# Patient Record
Sex: Male | Born: 1960
Health system: Southern US, Community
[De-identification: ages and names within clinical notes are randomized; demographics above are authoritative.]

## PROBLEM LIST (undated history)

## (undated) DIAGNOSIS — F419 Anxiety disorder, unspecified: Secondary | ICD-10-CM

## (undated) DIAGNOSIS — M0230 Reiter's disease, unspecified site: Secondary | ICD-10-CM

## (undated) DIAGNOSIS — K219 Gastro-esophageal reflux disease without esophagitis: Secondary | ICD-10-CM

## (undated) DIAGNOSIS — R51 Headache: Secondary | ICD-10-CM

## (undated) DIAGNOSIS — R519 Headache, unspecified: Secondary | ICD-10-CM

## (undated) DIAGNOSIS — N400 Enlarged prostate without lower urinary tract symptoms: Secondary | ICD-10-CM

## (undated) DIAGNOSIS — K589 Irritable bowel syndrome without diarrhea: Secondary | ICD-10-CM

## (undated) DIAGNOSIS — N3941 Urge incontinence: Secondary | ICD-10-CM

## (undated) DIAGNOSIS — M02369 Reiter's disease, unspecified knee: Secondary | ICD-10-CM

## (undated) DIAGNOSIS — B1920 Unspecified viral hepatitis C without hepatic coma: Secondary | ICD-10-CM

## (undated) DIAGNOSIS — M1990 Unspecified osteoarthritis, unspecified site: Secondary | ICD-10-CM

## (undated) DIAGNOSIS — M5136 Other intervertebral disc degeneration, lumbar region: Secondary | ICD-10-CM

## (undated) DIAGNOSIS — J329 Chronic sinusitis, unspecified: Secondary | ICD-10-CM

## (undated) DIAGNOSIS — N35919 Unspecified urethral stricture, male, unspecified site: Secondary | ICD-10-CM

## (undated) DIAGNOSIS — M533 Sacrococcygeal disorders, not elsewhere classified: Secondary | ICD-10-CM

## (undated) DIAGNOSIS — M5416 Radiculopathy, lumbar region: Secondary | ICD-10-CM

## (undated) DIAGNOSIS — N3281 Overactive bladder: Secondary | ICD-10-CM

## (undated) DIAGNOSIS — M51369 Other intervertebral disc degeneration, lumbar region without mention of lumbar back pain or lower extremity pain: Secondary | ICD-10-CM

## (undated) DIAGNOSIS — F429 Obsessive-compulsive disorder, unspecified: Secondary | ICD-10-CM

## (undated) DIAGNOSIS — M5126 Other intervertebral disc displacement, lumbar region: Secondary | ICD-10-CM

## (undated) DIAGNOSIS — D649 Anemia, unspecified: Secondary | ICD-10-CM

## (undated) DIAGNOSIS — L03115 Cellulitis of right lower limb: Secondary | ICD-10-CM

## (undated) HISTORY — PX: TONSILLECTOMY AND ADENOIDECTOMY: SUR1326

## (undated) HISTORY — DX: Unspecified urethral stricture, male, unspecified site: N35.919

## (undated) HISTORY — DX: Radiculopathy, lumbar region: M54.16

## (undated) HISTORY — DX: Obsessive-compulsive disorder, unspecified: F42.9

## (undated) HISTORY — DX: Reiter's disease, unspecified knee: M02.369

## (undated) HISTORY — DX: Anxiety disorder, unspecified: F41.9

## (undated) HISTORY — DX: Sacrococcygeal disorders, not elsewhere classified: M53.3

## (undated) HISTORY — PX: TRANSURETHRAL RESECTION OF PROSTATE: SHX73

## (undated) HISTORY — PX: FRACTURE SURGERY: SHX138

## (undated) HISTORY — DX: Urge incontinence: N39.41

## (undated) HISTORY — DX: Overactive bladder: N32.81

---

## 2003-01-21 ENCOUNTER — Other Ambulatory Visit (HOSPITAL_COMMUNITY): Admission: RE | Admit: 2003-01-21 | Discharge: 2003-01-29 | Payer: Self-pay | Admitting: Psychiatry

## 2007-10-28 ENCOUNTER — Emergency Department (HOSPITAL_COMMUNITY): Admission: EM | Admit: 2007-10-28 | Discharge: 2007-10-28 | Payer: Self-pay | Admitting: Emergency Medicine

## 2008-02-06 ENCOUNTER — Ambulatory Visit (HOSPITAL_BASED_OUTPATIENT_CLINIC_OR_DEPARTMENT_OTHER): Admission: RE | Admit: 2008-02-06 | Discharge: 2008-02-06 | Payer: Self-pay | Admitting: Urology

## 2008-06-13 HISTORY — PX: INGUINAL HERNIA REPAIR: SUR1180

## 2008-06-19 ENCOUNTER — Ambulatory Visit (HOSPITAL_COMMUNITY): Admission: RE | Admit: 2008-06-19 | Discharge: 2008-06-19 | Payer: Self-pay | Admitting: General Surgery

## 2008-06-19 ENCOUNTER — Encounter (INDEPENDENT_AMBULATORY_CARE_PROVIDER_SITE_OTHER): Payer: Self-pay | Admitting: General Surgery

## 2008-11-21 ENCOUNTER — Encounter: Admission: RE | Admit: 2008-11-21 | Discharge: 2008-11-21 | Payer: Self-pay | Admitting: Neurology

## 2010-04-12 ENCOUNTER — Ambulatory Visit (HOSPITAL_COMMUNITY): Admission: RE | Admit: 2010-04-12 | Discharge: 2010-04-12 | Payer: Self-pay | Admitting: Urology

## 2010-07-20 ENCOUNTER — Emergency Department (HOSPITAL_COMMUNITY): Admission: EM | Admit: 2010-07-20 | Discharge: 2010-07-20 | Payer: Self-pay | Admitting: Emergency Medicine

## 2011-03-28 NOTE — Op Note (Signed)
Ryan [name], Ryan [name]                ACCOUNT NO.:  [id number]   MEDICAL RECORD NO.:  [id number]          PATIENT TYPE:  AMB   LOCATION:  DAY                          FACILITY:  [facility]   PHYSICIAN:  Juanetta Gosling, MDDATE OF BIRTH:  04-01-1961   DATE OF PROCEDURE:  06/19/2008  DATE OF DISCHARGE:                               OPERATIVE REPORT   PREOPERATIVE DIAGNOSIS:  Left inguinal hernia.   POSTOPERATIVE DIAGNOSIS:  Indirect left inguinal hernia with cord  lipoma.   OPERATION PERFORMED:  Left inguinal hernia repair with mesh patch Ultra  Pro placed.   SURGEON:  Juanetta Gosling, MD   ASSISTANT:  Angelia Mould. Derrell Lolling, M.D.   ANESTHESIA:  General.   FINDINGS:  Indirect inguinal hernia with cord lipoma.   SPECIMENS:  Cord lipoma to pathology.   ESTIMATED BLOOD LOSS:  Minimal.   COMPLICATIONS:  None.   DRAINS:  None.   DISPOSITION:  To PACU in stable condition.   INDICATIONS FOR PROCEDURE:  The patient is a 50 year old male being seen  by Dr. Annabell Howells, his urologist recently.  Noticed some swelling and  discomfort in his left groin.  He noticed a bulge there for several  months associated with pain at times.  On his exam he had a left  inguinal hernia and we planned for left inguinal hernia repair with  mesh.   DESCRIPTION OF PROCEDURE:  After informed consent was obtained, the  patient was taken to the operating room.  He was administered 1 g of  intravenous Ancef without complication.  His left groin was then prepped  and draped in standard sterile surgical fashion.  Approximately a 4 cm  left groin incision was then made.  Dissection was then carried out down  to the external abdominal  oblique.  This was then entered sharply  through the external ring. The cord was then encircled with a Penrose  drain.  The ilioinguinal nerve was protected during this portion of the  procedure.  There was no direct defect noted.  There was a cord lipoma  noted that was freed from  the surrounding cord structures taking care to  preserve the vas deferens.  There was also an indirect hernia that was  present as well.  I tacked the internal oblique down to the shelving  edge to close the internal ring somewhat and then fashioned a piece  patch of Ultra Pro mesh that I sutured into place on the pubic tubercle.  Once the shelving edge, cut the mesh, wrapped it around the spermatic  cord and then tacked the mesh back together onto the end of the shelving  edge.  I also tacked this superiorly to the internal oblique and then  laid the lateral portion underneath the external oblique.  The mesh lay  flat at the end of the procedure and covered all necessary areas.  Hemostasis was observed.  A 3-0 Vicryl was then used to close the  external oblique.  A 4-0 Monocryl was used to close the skin.  Dermabond  was then placed over the skin.  The testicle was  in the scrotum at the  completion of the operation.  I infiltrated 10 mL of 0.25% Marcaine  without epinephrine into the wound as well as performed an ilioinguinal  block at the end of the procedure.  He tolerated this well, was  extubated in the operating room and transferred to PACU in stable  condition.      Juanetta Gosling, MD  Electronically Signed     MCW/MEDQ  D:  06/19/2008  T:  06/19/2008  Job:  [id number]

## 2011-03-28 NOTE — Op Note (Signed)
[name], [name]                ACCOUNT NO.:  [id number]   MEDICAL RECORD NO.:  [id number]          PATIENT TYPE:  AMB   LOCATION:  [facility]                         FACILITY:  [facility]   PHYSICIAN:  Excell Seltzer. Annabell Howells, M.D.    DATE OF BIRTH:  1961-08-12   DATE OF PROCEDURE:  02/06/2008  DATE OF DISCHARGE:                               OPERATIVE REPORT   PROCEDURE:  Cystoscopy, urethral balloon dilation, hydrodistention of  the bladder, and installation of Pyridium and Marcaine.   PREOPERATIVE DIAGNOSIS:  Urethral stricture disease, rule out  interstitial cystitis.   POSTOPERATIVE DIAGNOSIS:  Urethral stricture disease.   SURGEON:  Dr. Bjorn Pippin.   ANESTHESIA:  General.   SPECIMEN:  None.   COMPLICATIONS:  None.   INDICATIONS:  [name] is 50 year old white male with history of stricture  disease with a mild recurrence and significant irritative voiding  symptoms concerning for interstitial cystitis.   FINDINGS OF PROCEDURE:  The patient was given Cipro.  He was taken to  the operating room where general anesthetic was induced.  He was placed  in lithotomy position.  His perineum and genitalia were prepped with  Betadine solution.  He was draped in the usual sterile fashion.  Cystoscopy was performed using a 22-French scope and 12- and 70-degree  lenses.  Examination revealed a normal anterior urethra in the bulb.  There was recurrent stricture that with only mild resistance excepted a  22-French scope.  The external sphincter was intact.  The prostatic  urethra was short with no apparent hyperplasia, but the bladder neck was  somewhat tight and muscular.  Examination of bladder revealed a smooth  bladder wall without tumor, stones, or inflammation.  Ureteral orifices  were in their normal anatomic position effluxing clear urine.   After initial cystoscopy, a guidewire was placed through the scope into  the bladder and a 15-cm 24-French high-pressure balloon was then placed  across  the stricture and inflated to 14 atmospheres.  This was held for  2 minutes and then deflated.  The cystoscope was then reinserted.  Examination revealed disruption of the bulbar stricture with a widely  patent urethra.   The cystoscope was then placed in the bladder, and the bladder was  filled under 80 cm of water pressure capacity and held for 2 minutes.  The bladder was then drained.  His capacity under anesthesia was 940 mL.  There were very minimal glomerulations, nothing truly pathognomonic of  interstitial cystitis.   A 16-French red rubber catheter was inserted, and the bladder was  instilled with 30 mL of quarter percent Marcaine with 400 mg of crushed  Pyridium.  A B&O suppository was placed.  He was taken down from  lithotomy position.  His anesthetic was reversed.  He was moved to the  recovery room in stable condition.  There were no complications.      Excell Seltzer. Annabell Howells, M.D.  Electronically Signed     JJW/MEDQ  D:  02/06/2008  T:  02/07/2008  Job:  [id number]

## 2011-06-13 ENCOUNTER — Ambulatory Visit (HOSPITAL_BASED_OUTPATIENT_CLINIC_OR_DEPARTMENT_OTHER): Admission: RE | Admit: 2011-06-13 | Payer: Medicare Other | Source: Ambulatory Visit | Admitting: Urology

## 2011-06-13 ENCOUNTER — Ambulatory Visit (HOSPITAL_BASED_OUTPATIENT_CLINIC_OR_DEPARTMENT_OTHER)
Admission: RE | Admit: 2011-06-13 | Discharge: 2011-06-13 | Disposition: A | Discharge status: Home / Self Care | Payer: Medicare Other | Source: Ambulatory Visit | Attending: Urology | Admitting: Urology

## 2011-06-13 DIAGNOSIS — N138 Other obstructive and reflux uropathy: Secondary | ICD-10-CM | POA: Insufficient documentation

## 2011-06-13 DIAGNOSIS — Z79899 Other long term (current) drug therapy: Secondary | ICD-10-CM | POA: Insufficient documentation

## 2011-06-13 DIAGNOSIS — N401 Enlarged prostate with lower urinary tract symptoms: Secondary | ICD-10-CM | POA: Insufficient documentation

## 2011-06-13 DIAGNOSIS — Z01812 Encounter for preprocedural laboratory examination: Secondary | ICD-10-CM | POA: Insufficient documentation

## 2011-06-13 DIAGNOSIS — K219 Gastro-esophageal reflux disease without esophagitis: Secondary | ICD-10-CM | POA: Insufficient documentation

## 2011-06-13 DIAGNOSIS — N35919 Unspecified urethral stricture, male, unspecified site: Secondary | ICD-10-CM | POA: Insufficient documentation

## 2011-06-21 NOTE — Op Note (Signed)
  NAMEEVERET, FLAGG                ACCOUNT NO.:  [id number]  MEDICAL RECORD NO.:  [id number]  LOCATION:                                 FACILITY:  PHYSICIAN:  Excell Seltzer. Annabell Howells, M.D.    DATE OF BIRTH:  1961/01/09  DATE OF PROCEDURE:  06/13/2011 DATE OF DISCHARGE:                              OPERATIVE REPORT   PROCEDURE PERFORMED:  Cystoscopy, urethral dilation.  PREOPERATIVE DIAGNOSIS:  Urethral stricture.  POSTOPERATIVE DIAGNOSIS:  Urethral stricture.  SURGEON:  Excell Seltzer. Annabell Howells, MD  ANESTHESIA:  General.  SPECIMEN:  None.  DRAINS:  None.  COMPLICATIONS:  None.  INDICATIONS:  Cowen is a 50 year old white male with a long complicated urologic history.  He has bladder outlet obstruction and a history of urethral stricture disease.  He is interested in transurethral microwave thermotherapy of the prostate, but on recent office cysto, he had a stricture that was felt to be too narrow to easily admit the 22-French microwave catheter.  After discussing the options, he has elected to undergo management of stricture first with microwave therapy later, despite my efforts to try to convince him to undergo a bladder neck incision, which I think would be most effective for him.  DESCRIPTION OF PROCEDURE:  He was taken to operating room after receiving Cipro.  A general anesthetic was induced.  He was placed in lithotomy position.  His perineum and genitalia were prepped with Betadine solution.  He was draped in usual sterile fashion.  Cystoscopy was performed using a 22-French scope with a 12-degree lens. Examination revealed normal anterior urethra.  In the bulb, there was a stricture; however, it appeared less severe than it had on office cystoscopy and the 22-French scope passed easily through the stricture into the proximal urethra.  The external sphincter was intact. Prostatic urethra was approximately 3 cm in length with high bladder neck with obstruction.  Examination of  bladder revealed mild trabeculation.  No tumors or stones were identified.  The ureteral orifices were unremarkable.  After completion of cystoscopy, the urethra was dilated to 28-French with Sissy Hoff sounds.  I did not feel the laser incision was indicated. Once the stricture had been dilated, the bladder was drained.  The patient was taken down from lithotomy position.  His anesthetic was reversed.  He was admitted to the recovery room in stable condition. There were no complications.  He will be scheduled for the microwave treatment sometime in the near future.     Excell Seltzer. Annabell Howells, M.D.     JJW/MEDQ  D:  06/13/2011  T:  06/13/2011  Job:  [id number]  Electronically Signed by Bjorn Pippin M.D. on 06/21/2011 10:29:10 AM

## 2011-08-11 LAB — URINALYSIS, ROUTINE W REFLEX MICROSCOPIC
Bilirubin Urine: NEGATIVE
Glucose, UA: NEGATIVE
Hgb urine dipstick: NEGATIVE
Ketones, ur: NEGATIVE
Nitrite: NEGATIVE
Protein, ur: NEGATIVE
Specific Gravity, Urine: 1.025
Urobilinogen, UA: 0.2
pH: 7

## 2011-08-11 LAB — URINE MICROSCOPIC-ADD ON

## 2011-08-11 LAB — CBC
HCT: 42.8
Hemoglobin: 14.9
MCHC: 34.8
MCV: 97.1
Platelets: 201
RBC: 4.4
RDW: 11.9
WBC: 5

## 2011-08-11 LAB — BASIC METABOLIC PANEL WITH GFR
BUN: 12
CO2: 33 — ABNORMAL HIGH
Calcium: 9.4
Chloride: 101
Creatinine, Ser: 1
GFR calc non Af Amer: >60
Glucose, Bld: 78
Potassium: 4.5
Sodium: 140

## 2011-08-11 LAB — URINE CULTURE
Colony Count: NO GROWTH
Culture: NO GROWTH
Special Requests: NEGATIVE

## 2011-08-11 LAB — DIFFERENTIAL
Eosinophils Absolute: 0.2
Eosinophils Relative: 3
Lymphocytes Relative: 37
Neutro Abs: 2.5

## 2011-08-18 LAB — URINE CULTURE: Culture: NO GROWTH

## 2011-11-24 DIAGNOSIS — F429 Obsessive-compulsive disorder, unspecified: Secondary | ICD-10-CM | POA: Diagnosis not present

## 2011-12-11 DIAGNOSIS — J018 Other acute sinusitis: Secondary | ICD-10-CM | POA: Diagnosis not present

## 2011-12-15 DIAGNOSIS — S6000XA Contusion of unspecified finger without damage to nail, initial encounter: Secondary | ICD-10-CM | POA: Diagnosis not present

## 2011-12-15 DIAGNOSIS — W230XXA Caught, crushed, jammed, or pinched between moving objects, initial encounter: Secondary | ICD-10-CM | POA: Diagnosis not present

## 2011-12-15 DIAGNOSIS — F429 Obsessive-compulsive disorder, unspecified: Secondary | ICD-10-CM | POA: Diagnosis not present

## 2011-12-15 DIAGNOSIS — M25549 Pain in joints of unspecified hand: Secondary | ICD-10-CM | POA: Diagnosis not present

## 2012-01-25 DIAGNOSIS — F429 Obsessive-compulsive disorder, unspecified: Secondary | ICD-10-CM | POA: Diagnosis not present

## 2012-02-02 DIAGNOSIS — M25569 Pain in unspecified knee: Secondary | ICD-10-CM | POA: Diagnosis not present

## 2012-02-02 DIAGNOSIS — R21 Rash and other nonspecific skin eruption: Secondary | ICD-10-CM | POA: Diagnosis not present

## 2012-02-02 DIAGNOSIS — S6000XA Contusion of unspecified finger without damage to nail, initial encounter: Secondary | ICD-10-CM | POA: Diagnosis not present

## 2012-02-08 DIAGNOSIS — N3941 Urge incontinence: Secondary | ICD-10-CM | POA: Diagnosis not present

## 2012-02-08 DIAGNOSIS — N318 Other neuromuscular dysfunction of bladder: Secondary | ICD-10-CM | POA: Diagnosis not present

## 2012-02-08 DIAGNOSIS — N138 Other obstructive and reflux uropathy: Secondary | ICD-10-CM | POA: Diagnosis not present

## 2012-02-08 DIAGNOSIS — N401 Enlarged prostate with lower urinary tract symptoms: Secondary | ICD-10-CM | POA: Diagnosis not present

## 2012-02-12 DIAGNOSIS — L259 Unspecified contact dermatitis, unspecified cause: Secondary | ICD-10-CM | POA: Diagnosis not present

## 2012-02-12 DIAGNOSIS — M2550 Pain in unspecified joint: Secondary | ICD-10-CM | POA: Diagnosis not present

## 2012-02-12 DIAGNOSIS — L820 Inflamed seborrheic keratosis: Secondary | ICD-10-CM | POA: Diagnosis not present

## 2012-02-12 DIAGNOSIS — S62639A Displaced fracture of distal phalanx of unspecified finger, initial encounter for closed fracture: Secondary | ICD-10-CM | POA: Diagnosis not present

## 2012-02-12 DIAGNOSIS — S6000XA Contusion of unspecified finger without damage to nail, initial encounter: Secondary | ICD-10-CM | POA: Diagnosis not present

## 2012-02-15 DIAGNOSIS — F429 Obsessive-compulsive disorder, unspecified: Secondary | ICD-10-CM | POA: Diagnosis not present

## 2012-02-23 DIAGNOSIS — S62639A Displaced fracture of distal phalanx of unspecified finger, initial encounter for closed fracture: Secondary | ICD-10-CM | POA: Diagnosis not present

## 2012-02-29 DIAGNOSIS — S8000XA Contusion of unspecified knee, initial encounter: Secondary | ICD-10-CM | POA: Diagnosis not present

## 2012-02-29 DIAGNOSIS — IMO0002 Reserved for concepts with insufficient information to code with codable children: Secondary | ICD-10-CM | POA: Diagnosis not present

## 2012-03-08 DIAGNOSIS — F429 Obsessive-compulsive disorder, unspecified: Secondary | ICD-10-CM | POA: Diagnosis not present

## 2012-03-11 DIAGNOSIS — N411 Chronic prostatitis: Secondary | ICD-10-CM | POA: Diagnosis not present

## 2012-03-11 DIAGNOSIS — N3941 Urge incontinence: Secondary | ICD-10-CM | POA: Diagnosis not present

## 2012-03-11 DIAGNOSIS — N138 Other obstructive and reflux uropathy: Secondary | ICD-10-CM | POA: Diagnosis not present

## 2012-03-11 DIAGNOSIS — N401 Enlarged prostate with lower urinary tract symptoms: Secondary | ICD-10-CM | POA: Diagnosis not present

## 2012-03-11 DIAGNOSIS — N318 Other neuromuscular dysfunction of bladder: Secondary | ICD-10-CM | POA: Diagnosis not present

## 2012-03-22 DIAGNOSIS — S62639A Displaced fracture of distal phalanx of unspecified finger, initial encounter for closed fracture: Secondary | ICD-10-CM | POA: Diagnosis not present

## 2012-04-05 DIAGNOSIS — F429 Obsessive-compulsive disorder, unspecified: Secondary | ICD-10-CM | POA: Diagnosis not present

## 2012-05-13 DIAGNOSIS — N318 Other neuromuscular dysfunction of bladder: Secondary | ICD-10-CM | POA: Diagnosis not present

## 2012-05-13 DIAGNOSIS — N3941 Urge incontinence: Secondary | ICD-10-CM | POA: Diagnosis not present

## 2012-05-13 DIAGNOSIS — N138 Other obstructive and reflux uropathy: Secondary | ICD-10-CM | POA: Diagnosis not present

## 2012-05-13 DIAGNOSIS — N401 Enlarged prostate with lower urinary tract symptoms: Secondary | ICD-10-CM | POA: Diagnosis not present

## 2012-05-30 DIAGNOSIS — H04129 Dry eye syndrome of unspecified lacrimal gland: Secondary | ICD-10-CM | POA: Diagnosis not present

## 2012-05-31 DIAGNOSIS — N411 Chronic prostatitis: Secondary | ICD-10-CM | POA: Diagnosis not present

## 2012-05-31 DIAGNOSIS — R350 Frequency of micturition: Secondary | ICD-10-CM | POA: Diagnosis not present

## 2012-05-31 DIAGNOSIS — R634 Abnormal weight loss: Secondary | ICD-10-CM | POA: Diagnosis not present

## 2012-06-06 DIAGNOSIS — F429 Obsessive-compulsive disorder, unspecified: Secondary | ICD-10-CM | POA: Diagnosis not present

## 2012-06-13 DIAGNOSIS — N3281 Overactive bladder: Secondary | ICD-10-CM | POA: Insufficient documentation

## 2012-06-27 DIAGNOSIS — M25529 Pain in unspecified elbow: Secondary | ICD-10-CM | POA: Diagnosis not present

## 2012-06-27 DIAGNOSIS — M7710 Lateral epicondylitis, unspecified elbow: Secondary | ICD-10-CM | POA: Diagnosis not present

## 2012-07-01 DIAGNOSIS — M7710 Lateral epicondylitis, unspecified elbow: Secondary | ICD-10-CM | POA: Diagnosis not present

## 2012-07-01 DIAGNOSIS — M25529 Pain in unspecified elbow: Secondary | ICD-10-CM | POA: Diagnosis not present

## 2012-07-01 DIAGNOSIS — M6281 Muscle weakness (generalized): Secondary | ICD-10-CM | POA: Diagnosis not present

## 2012-07-04 DIAGNOSIS — N138 Other obstructive and reflux uropathy: Secondary | ICD-10-CM | POA: Diagnosis not present

## 2012-07-04 DIAGNOSIS — N3289 Other specified disorders of bladder: Secondary | ICD-10-CM | POA: Diagnosis not present

## 2012-07-04 DIAGNOSIS — N401 Enlarged prostate with lower urinary tract symptoms: Secondary | ICD-10-CM | POA: Diagnosis not present

## 2012-07-05 DIAGNOSIS — M25529 Pain in unspecified elbow: Secondary | ICD-10-CM | POA: Diagnosis not present

## 2012-07-05 DIAGNOSIS — M6281 Muscle weakness (generalized): Secondary | ICD-10-CM | POA: Diagnosis not present

## 2012-07-05 DIAGNOSIS — M7710 Lateral epicondylitis, unspecified elbow: Secondary | ICD-10-CM | POA: Diagnosis not present

## 2012-07-11 DIAGNOSIS — F429 Obsessive-compulsive disorder, unspecified: Secondary | ICD-10-CM | POA: Diagnosis not present

## 2012-07-12 DIAGNOSIS — M6281 Muscle weakness (generalized): Secondary | ICD-10-CM | POA: Diagnosis not present

## 2012-07-12 DIAGNOSIS — M25529 Pain in unspecified elbow: Secondary | ICD-10-CM | POA: Diagnosis not present

## 2012-07-12 DIAGNOSIS — M7710 Lateral epicondylitis, unspecified elbow: Secondary | ICD-10-CM | POA: Diagnosis not present

## 2012-07-17 DIAGNOSIS — M7710 Lateral epicondylitis, unspecified elbow: Secondary | ICD-10-CM | POA: Diagnosis not present

## 2012-07-17 DIAGNOSIS — M25529 Pain in unspecified elbow: Secondary | ICD-10-CM | POA: Diagnosis not present

## 2012-07-17 DIAGNOSIS — M6281 Muscle weakness (generalized): Secondary | ICD-10-CM | POA: Diagnosis not present

## 2012-07-23 DIAGNOSIS — M7710 Lateral epicondylitis, unspecified elbow: Secondary | ICD-10-CM | POA: Diagnosis not present

## 2012-07-23 DIAGNOSIS — M6281 Muscle weakness (generalized): Secondary | ICD-10-CM | POA: Diagnosis not present

## 2012-07-23 DIAGNOSIS — M25529 Pain in unspecified elbow: Secondary | ICD-10-CM | POA: Diagnosis not present

## 2012-07-25 DIAGNOSIS — N138 Other obstructive and reflux uropathy: Secondary | ICD-10-CM | POA: Diagnosis not present

## 2012-07-25 DIAGNOSIS — N401 Enlarged prostate with lower urinary tract symptoms: Secondary | ICD-10-CM | POA: Diagnosis not present

## 2012-07-25 DIAGNOSIS — N3289 Other specified disorders of bladder: Secondary | ICD-10-CM | POA: Diagnosis not present

## 2012-07-26 DIAGNOSIS — M25529 Pain in unspecified elbow: Secondary | ICD-10-CM | POA: Diagnosis not present

## 2012-07-26 DIAGNOSIS — M6281 Muscle weakness (generalized): Secondary | ICD-10-CM | POA: Diagnosis not present

## 2012-07-26 DIAGNOSIS — M7710 Lateral epicondylitis, unspecified elbow: Secondary | ICD-10-CM | POA: Diagnosis not present

## 2012-07-30 DIAGNOSIS — M6281 Muscle weakness (generalized): Secondary | ICD-10-CM | POA: Diagnosis not present

## 2012-07-30 DIAGNOSIS — M25529 Pain in unspecified elbow: Secondary | ICD-10-CM | POA: Diagnosis not present

## 2012-07-30 DIAGNOSIS — M7710 Lateral epicondylitis, unspecified elbow: Secondary | ICD-10-CM | POA: Diagnosis not present

## 2012-08-01 DIAGNOSIS — M7710 Lateral epicondylitis, unspecified elbow: Secondary | ICD-10-CM | POA: Diagnosis not present

## 2012-08-01 DIAGNOSIS — M6281 Muscle weakness (generalized): Secondary | ICD-10-CM | POA: Diagnosis not present

## 2012-08-01 DIAGNOSIS — H04129 Dry eye syndrome of unspecified lacrimal gland: Secondary | ICD-10-CM | POA: Diagnosis not present

## 2012-08-01 DIAGNOSIS — M25529 Pain in unspecified elbow: Secondary | ICD-10-CM | POA: Diagnosis not present

## 2012-08-05 DIAGNOSIS — M25529 Pain in unspecified elbow: Secondary | ICD-10-CM | POA: Diagnosis not present

## 2012-08-05 DIAGNOSIS — M6281 Muscle weakness (generalized): Secondary | ICD-10-CM | POA: Diagnosis not present

## 2012-08-05 DIAGNOSIS — M7710 Lateral epicondylitis, unspecified elbow: Secondary | ICD-10-CM | POA: Diagnosis not present

## 2012-08-08 DIAGNOSIS — M7710 Lateral epicondylitis, unspecified elbow: Secondary | ICD-10-CM | POA: Diagnosis not present

## 2012-08-08 DIAGNOSIS — M25529 Pain in unspecified elbow: Secondary | ICD-10-CM | POA: Diagnosis not present

## 2012-08-09 DIAGNOSIS — M7710 Lateral epicondylitis, unspecified elbow: Secondary | ICD-10-CM | POA: Diagnosis not present

## 2012-08-09 DIAGNOSIS — M25529 Pain in unspecified elbow: Secondary | ICD-10-CM | POA: Diagnosis not present

## 2012-08-09 DIAGNOSIS — M6281 Muscle weakness (generalized): Secondary | ICD-10-CM | POA: Diagnosis not present

## 2012-08-13 DIAGNOSIS — M7710 Lateral epicondylitis, unspecified elbow: Secondary | ICD-10-CM | POA: Diagnosis not present

## 2012-08-13 DIAGNOSIS — M25529 Pain in unspecified elbow: Secondary | ICD-10-CM | POA: Diagnosis not present

## 2012-08-13 DIAGNOSIS — M6281 Muscle weakness (generalized): Secondary | ICD-10-CM | POA: Diagnosis not present

## 2012-08-22 DIAGNOSIS — N401 Enlarged prostate with lower urinary tract symptoms: Secondary | ICD-10-CM | POA: Diagnosis not present

## 2012-08-22 DIAGNOSIS — N3289 Other specified disorders of bladder: Secondary | ICD-10-CM | POA: Diagnosis not present

## 2012-08-22 DIAGNOSIS — N138 Other obstructive and reflux uropathy: Secondary | ICD-10-CM | POA: Diagnosis not present

## 2012-09-04 DIAGNOSIS — R350 Frequency of micturition: Secondary | ICD-10-CM | POA: Diagnosis not present

## 2012-09-04 DIAGNOSIS — Z23 Encounter for immunization: Secondary | ICD-10-CM | POA: Diagnosis not present

## 2012-09-04 DIAGNOSIS — N411 Chronic prostatitis: Secondary | ICD-10-CM | POA: Diagnosis not present

## 2012-09-04 DIAGNOSIS — F339 Major depressive disorder, recurrent, unspecified: Secondary | ICD-10-CM | POA: Diagnosis not present

## 2012-09-23 DIAGNOSIS — N138 Other obstructive and reflux uropathy: Secondary | ICD-10-CM | POA: Diagnosis not present

## 2012-09-23 DIAGNOSIS — N3289 Other specified disorders of bladder: Secondary | ICD-10-CM | POA: Diagnosis not present

## 2012-09-23 DIAGNOSIS — N401 Enlarged prostate with lower urinary tract symptoms: Secondary | ICD-10-CM | POA: Diagnosis not present

## 2012-10-03 DIAGNOSIS — F429 Obsessive-compulsive disorder, unspecified: Secondary | ICD-10-CM | POA: Diagnosis not present

## 2012-10-18 DIAGNOSIS — M7710 Lateral epicondylitis, unspecified elbow: Secondary | ICD-10-CM | POA: Diagnosis not present

## 2012-10-30 DIAGNOSIS — N138 Other obstructive and reflux uropathy: Secondary | ICD-10-CM | POA: Diagnosis not present

## 2012-10-30 DIAGNOSIS — N401 Enlarged prostate with lower urinary tract symptoms: Secondary | ICD-10-CM | POA: Diagnosis not present

## 2012-10-30 DIAGNOSIS — N318 Other neuromuscular dysfunction of bladder: Secondary | ICD-10-CM | POA: Diagnosis not present

## 2012-10-31 DIAGNOSIS — H04129 Dry eye syndrome of unspecified lacrimal gland: Secondary | ICD-10-CM | POA: Diagnosis not present

## 2012-10-31 DIAGNOSIS — H524 Presbyopia: Secondary | ICD-10-CM | POA: Diagnosis not present

## 2012-11-27 DIAGNOSIS — N401 Enlarged prostate with lower urinary tract symptoms: Secondary | ICD-10-CM | POA: Diagnosis not present

## 2012-11-27 DIAGNOSIS — N318 Other neuromuscular dysfunction of bladder: Secondary | ICD-10-CM | POA: Diagnosis not present

## 2012-11-27 DIAGNOSIS — N138 Other obstructive and reflux uropathy: Secondary | ICD-10-CM | POA: Diagnosis not present

## 2012-12-13 DIAGNOSIS — J00 Acute nasopharyngitis [common cold]: Secondary | ICD-10-CM | POA: Diagnosis not present

## 2012-12-13 DIAGNOSIS — J019 Acute sinusitis, unspecified: Secondary | ICD-10-CM | POA: Diagnosis not present

## 2012-12-18 DIAGNOSIS — N401 Enlarged prostate with lower urinary tract symptoms: Secondary | ICD-10-CM | POA: Diagnosis not present

## 2012-12-18 DIAGNOSIS — N138 Other obstructive and reflux uropathy: Secondary | ICD-10-CM | POA: Diagnosis not present

## 2012-12-18 DIAGNOSIS — N318 Other neuromuscular dysfunction of bladder: Secondary | ICD-10-CM | POA: Diagnosis not present

## 2012-12-18 DIAGNOSIS — N3289 Other specified disorders of bladder: Secondary | ICD-10-CM | POA: Diagnosis not present

## 2013-01-02 DIAGNOSIS — H04129 Dry eye syndrome of unspecified lacrimal gland: Secondary | ICD-10-CM | POA: Diagnosis not present

## 2013-01-15 DIAGNOSIS — N138 Other obstructive and reflux uropathy: Secondary | ICD-10-CM | POA: Diagnosis not present

## 2013-01-15 DIAGNOSIS — N318 Other neuromuscular dysfunction of bladder: Secondary | ICD-10-CM | POA: Diagnosis not present

## 2013-01-15 DIAGNOSIS — N401 Enlarged prostate with lower urinary tract symptoms: Secondary | ICD-10-CM | POA: Diagnosis not present

## 2013-01-27 DIAGNOSIS — J309 Allergic rhinitis, unspecified: Secondary | ICD-10-CM | POA: Diagnosis not present

## 2013-01-27 DIAGNOSIS — J018 Other acute sinusitis: Secondary | ICD-10-CM | POA: Diagnosis not present

## 2013-02-04 DIAGNOSIS — F429 Obsessive-compulsive disorder, unspecified: Secondary | ICD-10-CM | POA: Diagnosis not present

## 2013-02-05 DIAGNOSIS — N401 Enlarged prostate with lower urinary tract symptoms: Secondary | ICD-10-CM | POA: Diagnosis not present

## 2013-02-05 DIAGNOSIS — N138 Other obstructive and reflux uropathy: Secondary | ICD-10-CM | POA: Diagnosis not present

## 2013-02-05 DIAGNOSIS — R109 Unspecified abdominal pain: Secondary | ICD-10-CM | POA: Diagnosis not present

## 2013-02-05 DIAGNOSIS — N318 Other neuromuscular dysfunction of bladder: Secondary | ICD-10-CM | POA: Diagnosis not present

## 2013-02-19 DIAGNOSIS — M0230 Reiter's disease, unspecified site: Secondary | ICD-10-CM | POA: Diagnosis not present

## 2013-02-20 DIAGNOSIS — Z79899 Other long term (current) drug therapy: Secondary | ICD-10-CM | POA: Diagnosis not present

## 2013-02-20 DIAGNOSIS — Z0000 Encounter for general adult medical examination without abnormal findings: Secondary | ICD-10-CM | POA: Diagnosis not present

## 2013-02-20 DIAGNOSIS — J309 Allergic rhinitis, unspecified: Secondary | ICD-10-CM | POA: Diagnosis not present

## 2013-02-20 DIAGNOSIS — J019 Acute sinusitis, unspecified: Secondary | ICD-10-CM | POA: Diagnosis not present

## 2013-02-27 DIAGNOSIS — N318 Other neuromuscular dysfunction of bladder: Secondary | ICD-10-CM | POA: Diagnosis not present

## 2013-02-27 DIAGNOSIS — N138 Other obstructive and reflux uropathy: Secondary | ICD-10-CM | POA: Diagnosis not present

## 2013-02-27 DIAGNOSIS — N401 Enlarged prostate with lower urinary tract symptoms: Secondary | ICD-10-CM | POA: Diagnosis not present

## 2013-03-10 DIAGNOSIS — F429 Obsessive-compulsive disorder, unspecified: Secondary | ICD-10-CM | POA: Diagnosis not present

## 2013-04-04 DIAGNOSIS — F429 Obsessive-compulsive disorder, unspecified: Secondary | ICD-10-CM | POA: Diagnosis not present

## 2013-04-09 DIAGNOSIS — J018 Other acute sinusitis: Secondary | ICD-10-CM | POA: Diagnosis not present

## 2013-04-10 DIAGNOSIS — N318 Other neuromuscular dysfunction of bladder: Secondary | ICD-10-CM | POA: Diagnosis not present

## 2013-04-10 DIAGNOSIS — N138 Other obstructive and reflux uropathy: Secondary | ICD-10-CM | POA: Diagnosis not present

## 2013-04-10 DIAGNOSIS — N401 Enlarged prostate with lower urinary tract symptoms: Secondary | ICD-10-CM | POA: Diagnosis not present

## 2013-05-06 DIAGNOSIS — J309 Allergic rhinitis, unspecified: Secondary | ICD-10-CM | POA: Diagnosis not present

## 2013-05-06 DIAGNOSIS — I781 Nevus, non-neoplastic: Secondary | ICD-10-CM | POA: Diagnosis not present

## 2013-05-12 DIAGNOSIS — F429 Obsessive-compulsive disorder, unspecified: Secondary | ICD-10-CM | POA: Diagnosis not present

## 2013-05-28 DIAGNOSIS — L821 Other seborrheic keratosis: Secondary | ICD-10-CM | POA: Diagnosis not present

## 2013-05-28 DIAGNOSIS — L570 Actinic keratosis: Secondary | ICD-10-CM | POA: Diagnosis not present

## 2013-05-28 DIAGNOSIS — B351 Tinea unguium: Secondary | ICD-10-CM | POA: Diagnosis not present

## 2013-05-28 DIAGNOSIS — B353 Tinea pedis: Secondary | ICD-10-CM | POA: Diagnosis not present

## 2013-05-29 DIAGNOSIS — J3489 Other specified disorders of nose and nasal sinuses: Secondary | ICD-10-CM | POA: Diagnosis not present

## 2013-05-29 DIAGNOSIS — H60399 Other infective otitis externa, unspecified ear: Secondary | ICD-10-CM | POA: Diagnosis not present

## 2013-05-29 DIAGNOSIS — J329 Chronic sinusitis, unspecified: Secondary | ICD-10-CM | POA: Diagnosis not present

## 2013-05-29 DIAGNOSIS — J309 Allergic rhinitis, unspecified: Secondary | ICD-10-CM | POA: Diagnosis not present

## 2013-06-01 DIAGNOSIS — R209 Unspecified disturbances of skin sensation: Secondary | ICD-10-CM | POA: Diagnosis not present

## 2013-06-01 DIAGNOSIS — R5381 Other malaise: Secondary | ICD-10-CM | POA: Diagnosis not present

## 2013-06-01 DIAGNOSIS — R0789 Other chest pain: Secondary | ICD-10-CM | POA: Diagnosis not present

## 2013-06-01 DIAGNOSIS — M5412 Radiculopathy, cervical region: Secondary | ICD-10-CM | POA: Diagnosis not present

## 2013-06-06 DIAGNOSIS — N390 Urinary tract infection, site not specified: Secondary | ICD-10-CM | POA: Diagnosis not present

## 2013-06-06 DIAGNOSIS — B351 Tinea unguium: Secondary | ICD-10-CM | POA: Diagnosis not present

## 2013-06-06 DIAGNOSIS — J329 Chronic sinusitis, unspecified: Secondary | ICD-10-CM | POA: Diagnosis not present

## 2013-06-09 DIAGNOSIS — J3489 Other specified disorders of nose and nasal sinuses: Secondary | ICD-10-CM | POA: Diagnosis not present

## 2013-06-09 DIAGNOSIS — J343 Hypertrophy of nasal turbinates: Secondary | ICD-10-CM | POA: Diagnosis not present

## 2013-06-09 DIAGNOSIS — J342 Deviated nasal septum: Secondary | ICD-10-CM | POA: Diagnosis not present

## 2013-06-20 DIAGNOSIS — F429 Obsessive-compulsive disorder, unspecified: Secondary | ICD-10-CM | POA: Diagnosis not present

## 2013-06-23 DIAGNOSIS — N400 Enlarged prostate without lower urinary tract symptoms: Secondary | ICD-10-CM | POA: Diagnosis not present

## 2013-06-23 DIAGNOSIS — R350 Frequency of micturition: Secondary | ICD-10-CM | POA: Diagnosis not present

## 2013-07-08 DIAGNOSIS — J342 Deviated nasal septum: Secondary | ICD-10-CM | POA: Diagnosis not present

## 2013-07-08 DIAGNOSIS — J3489 Other specified disorders of nose and nasal sinuses: Secondary | ICD-10-CM | POA: Diagnosis not present

## 2013-07-08 DIAGNOSIS — J343 Hypertrophy of nasal turbinates: Secondary | ICD-10-CM | POA: Diagnosis not present

## 2013-07-18 DIAGNOSIS — M79609 Pain in unspecified limb: Secondary | ICD-10-CM | POA: Diagnosis not present

## 2013-07-18 DIAGNOSIS — M7989 Other specified soft tissue disorders: Secondary | ICD-10-CM | POA: Diagnosis not present

## 2013-07-18 DIAGNOSIS — I83893 Varicose veins of bilateral lower extremities with other complications: Secondary | ICD-10-CM | POA: Diagnosis not present

## 2013-07-22 DIAGNOSIS — M79609 Pain in unspecified limb: Secondary | ICD-10-CM | POA: Diagnosis not present

## 2013-07-22 DIAGNOSIS — M7989 Other specified soft tissue disorders: Secondary | ICD-10-CM | POA: Diagnosis not present

## 2013-07-22 DIAGNOSIS — I83893 Varicose veins of bilateral lower extremities with other complications: Secondary | ICD-10-CM | POA: Diagnosis not present

## 2013-07-30 DIAGNOSIS — N400 Enlarged prostate without lower urinary tract symptoms: Secondary | ICD-10-CM | POA: Diagnosis not present

## 2013-07-30 DIAGNOSIS — Z87891 Personal history of nicotine dependence: Secondary | ICD-10-CM | POA: Diagnosis not present

## 2013-07-30 DIAGNOSIS — R350 Frequency of micturition: Secondary | ICD-10-CM | POA: Diagnosis not present

## 2013-08-05 DIAGNOSIS — L259 Unspecified contact dermatitis, unspecified cause: Secondary | ICD-10-CM | POA: Diagnosis not present

## 2013-08-05 DIAGNOSIS — B351 Tinea unguium: Secondary | ICD-10-CM | POA: Diagnosis not present

## 2013-08-20 DIAGNOSIS — N3941 Urge incontinence: Secondary | ICD-10-CM | POA: Diagnosis not present

## 2013-08-20 DIAGNOSIS — N400 Enlarged prostate without lower urinary tract symptoms: Secondary | ICD-10-CM | POA: Diagnosis not present

## 2013-08-20 DIAGNOSIS — R350 Frequency of micturition: Secondary | ICD-10-CM | POA: Diagnosis not present

## 2013-08-20 DIAGNOSIS — N35919 Unspecified urethral stricture, male, unspecified site: Secondary | ICD-10-CM | POA: Diagnosis not present

## 2013-08-22 DIAGNOSIS — I83893 Varicose veins of bilateral lower extremities with other complications: Secondary | ICD-10-CM | POA: Diagnosis not present

## 2013-08-22 DIAGNOSIS — M7989 Other specified soft tissue disorders: Secondary | ICD-10-CM | POA: Diagnosis not present

## 2013-08-22 DIAGNOSIS — M79609 Pain in unspecified limb: Secondary | ICD-10-CM | POA: Diagnosis not present

## 2013-09-19 DIAGNOSIS — I83893 Varicose veins of bilateral lower extremities with other complications: Secondary | ICD-10-CM | POA: Diagnosis not present

## 2013-09-19 DIAGNOSIS — M7989 Other specified soft tissue disorders: Secondary | ICD-10-CM | POA: Diagnosis not present

## 2013-09-19 DIAGNOSIS — M79609 Pain in unspecified limb: Secondary | ICD-10-CM | POA: Diagnosis not present

## 2013-09-24 DIAGNOSIS — N320 Bladder-neck obstruction: Secondary | ICD-10-CM | POA: Diagnosis not present

## 2013-09-24 DIAGNOSIS — N400 Enlarged prostate without lower urinary tract symptoms: Secondary | ICD-10-CM | POA: Diagnosis not present

## 2013-09-30 DIAGNOSIS — M79609 Pain in unspecified limb: Secondary | ICD-10-CM | POA: Diagnosis not present

## 2013-09-30 DIAGNOSIS — I83893 Varicose veins of bilateral lower extremities with other complications: Secondary | ICD-10-CM | POA: Diagnosis not present

## 2013-09-30 DIAGNOSIS — M7989 Other specified soft tissue disorders: Secondary | ICD-10-CM | POA: Diagnosis not present

## 2013-10-01 DIAGNOSIS — F429 Obsessive-compulsive disorder, unspecified: Secondary | ICD-10-CM | POA: Insufficient documentation

## 2013-10-13 DIAGNOSIS — F429 Obsessive-compulsive disorder, unspecified: Secondary | ICD-10-CM | POA: Diagnosis not present

## 2013-10-28 DIAGNOSIS — I83893 Varicose veins of bilateral lower extremities with other complications: Secondary | ICD-10-CM | POA: Diagnosis not present

## 2013-10-31 DIAGNOSIS — N35919 Unspecified urethral stricture, male, unspecified site: Secondary | ICD-10-CM | POA: Diagnosis not present

## 2013-10-31 DIAGNOSIS — N400 Enlarged prostate without lower urinary tract symptoms: Secondary | ICD-10-CM | POA: Diagnosis not present

## 2013-11-11 DIAGNOSIS — I83893 Varicose veins of bilateral lower extremities with other complications: Secondary | ICD-10-CM | POA: Diagnosis not present

## 2013-11-12 DIAGNOSIS — I83893 Varicose veins of bilateral lower extremities with other complications: Secondary | ICD-10-CM | POA: Diagnosis not present

## 2013-11-12 DIAGNOSIS — M79609 Pain in unspecified limb: Secondary | ICD-10-CM | POA: Diagnosis not present

## 2013-11-13 HISTORY — PX: PROSTATE SURGERY: SHX751

## 2013-11-14 DIAGNOSIS — S298XXA Other specified injuries of thorax, initial encounter: Secondary | ICD-10-CM | POA: Diagnosis not present

## 2013-11-14 DIAGNOSIS — S0003XA Contusion of scalp, initial encounter: Secondary | ICD-10-CM | POA: Diagnosis not present

## 2013-11-14 DIAGNOSIS — S0993XA Unspecified injury of face, initial encounter: Secondary | ICD-10-CM | POA: Diagnosis not present

## 2013-11-14 DIAGNOSIS — Z888 Allergy status to other drugs, medicaments and biological substances status: Secondary | ICD-10-CM | POA: Diagnosis not present

## 2013-11-14 DIAGNOSIS — S0100XA Unspecified open wound of scalp, initial encounter: Secondary | ICD-10-CM | POA: Diagnosis not present

## 2013-11-14 DIAGNOSIS — S060X0A Concussion without loss of consciousness, initial encounter: Secondary | ICD-10-CM | POA: Diagnosis not present

## 2013-11-14 DIAGNOSIS — S1093XA Contusion of unspecified part of neck, initial encounter: Secondary | ICD-10-CM | POA: Diagnosis not present

## 2013-11-14 DIAGNOSIS — M47812 Spondylosis without myelopathy or radiculopathy, cervical region: Secondary | ICD-10-CM | POA: Diagnosis not present

## 2013-11-14 DIAGNOSIS — T07XXXA Unspecified multiple injuries, initial encounter: Secondary | ICD-10-CM | POA: Diagnosis not present

## 2013-11-14 DIAGNOSIS — IMO0002 Reserved for concepts with insufficient information to code with codable children: Secondary | ICD-10-CM | POA: Diagnosis not present

## 2013-11-14 DIAGNOSIS — R1110 Vomiting, unspecified: Secondary | ICD-10-CM | POA: Diagnosis not present

## 2013-11-14 DIAGNOSIS — S0990XA Unspecified injury of head, initial encounter: Secondary | ICD-10-CM | POA: Diagnosis not present

## 2013-11-26 DIAGNOSIS — B351 Tinea unguium: Secondary | ICD-10-CM | POA: Diagnosis not present

## 2013-12-22 DIAGNOSIS — B351 Tinea unguium: Secondary | ICD-10-CM | POA: Diagnosis not present

## 2013-12-23 DIAGNOSIS — M79609 Pain in unspecified limb: Secondary | ICD-10-CM | POA: Diagnosis not present

## 2013-12-23 DIAGNOSIS — M7989 Other specified soft tissue disorders: Secondary | ICD-10-CM | POA: Diagnosis not present

## 2013-12-23 DIAGNOSIS — I83893 Varicose veins of bilateral lower extremities with other complications: Secondary | ICD-10-CM | POA: Diagnosis not present

## 2013-12-25 DIAGNOSIS — M7989 Other specified soft tissue disorders: Secondary | ICD-10-CM | POA: Diagnosis not present

## 2013-12-25 DIAGNOSIS — I83893 Varicose veins of bilateral lower extremities with other complications: Secondary | ICD-10-CM | POA: Diagnosis not present

## 2013-12-25 DIAGNOSIS — M79609 Pain in unspecified limb: Secondary | ICD-10-CM | POA: Diagnosis not present

## 2014-01-06 DIAGNOSIS — N401 Enlarged prostate with lower urinary tract symptoms: Secondary | ICD-10-CM | POA: Diagnosis not present

## 2014-01-06 DIAGNOSIS — N138 Other obstructive and reflux uropathy: Secondary | ICD-10-CM | POA: Diagnosis not present

## 2014-01-06 DIAGNOSIS — Z87891 Personal history of nicotine dependence: Secondary | ICD-10-CM | POA: Diagnosis not present

## 2014-01-06 DIAGNOSIS — N400 Enlarged prostate without lower urinary tract symptoms: Secondary | ICD-10-CM | POA: Diagnosis not present

## 2014-01-06 DIAGNOSIS — R350 Frequency of micturition: Secondary | ICD-10-CM | POA: Diagnosis not present

## 2014-01-06 DIAGNOSIS — F429 Obsessive-compulsive disorder, unspecified: Secondary | ICD-10-CM | POA: Diagnosis not present

## 2014-01-07 DIAGNOSIS — N401 Enlarged prostate with lower urinary tract symptoms: Secondary | ICD-10-CM | POA: Diagnosis not present

## 2014-01-07 DIAGNOSIS — N138 Other obstructive and reflux uropathy: Secondary | ICD-10-CM | POA: Diagnosis not present

## 2014-01-07 DIAGNOSIS — F429 Obsessive-compulsive disorder, unspecified: Secondary | ICD-10-CM | POA: Diagnosis not present

## 2014-01-07 DIAGNOSIS — Z87891 Personal history of nicotine dependence: Secondary | ICD-10-CM | POA: Diagnosis not present

## 2014-01-07 DIAGNOSIS — R350 Frequency of micturition: Secondary | ICD-10-CM | POA: Diagnosis not present

## 2014-01-28 DIAGNOSIS — S060XAA Concussion with loss of consciousness status unknown, initial encounter: Secondary | ICD-10-CM | POA: Diagnosis not present

## 2014-01-28 DIAGNOSIS — S060X9A Concussion with loss of consciousness of unspecified duration, initial encounter: Secondary | ICD-10-CM | POA: Diagnosis not present

## 2014-01-29 DIAGNOSIS — M79609 Pain in unspecified limb: Secondary | ICD-10-CM | POA: Diagnosis not present

## 2014-01-29 DIAGNOSIS — M7989 Other specified soft tissue disorders: Secondary | ICD-10-CM | POA: Diagnosis not present

## 2014-01-30 DIAGNOSIS — B351 Tinea unguium: Secondary | ICD-10-CM | POA: Diagnosis not present

## 2014-02-02 DIAGNOSIS — F429 Obsessive-compulsive disorder, unspecified: Secondary | ICD-10-CM | POA: Diagnosis not present

## 2014-02-04 DIAGNOSIS — N400 Enlarged prostate without lower urinary tract symptoms: Secondary | ICD-10-CM | POA: Diagnosis not present

## 2014-02-04 DIAGNOSIS — Z87448 Personal history of other diseases of urinary system: Secondary | ICD-10-CM | POA: Diagnosis not present

## 2014-02-04 DIAGNOSIS — Z5189 Encounter for other specified aftercare: Secondary | ICD-10-CM | POA: Diagnosis not present

## 2014-02-04 DIAGNOSIS — R350 Frequency of micturition: Secondary | ICD-10-CM | POA: Diagnosis not present

## 2014-02-04 DIAGNOSIS — B957 Other staphylococcus as the cause of diseases classified elsewhere: Secondary | ICD-10-CM | POA: Diagnosis not present

## 2014-02-04 DIAGNOSIS — Z87891 Personal history of nicotine dependence: Secondary | ICD-10-CM | POA: Diagnosis not present

## 2014-02-23 DIAGNOSIS — Z9889 Other specified postprocedural states: Secondary | ICD-10-CM | POA: Diagnosis not present

## 2014-02-23 DIAGNOSIS — N400 Enlarged prostate without lower urinary tract symptoms: Secondary | ICD-10-CM | POA: Diagnosis not present

## 2014-02-23 DIAGNOSIS — Z87891 Personal history of nicotine dependence: Secondary | ICD-10-CM | POA: Diagnosis not present

## 2014-02-23 DIAGNOSIS — R300 Dysuria: Secondary | ICD-10-CM | POA: Diagnosis not present

## 2014-02-23 DIAGNOSIS — R319 Hematuria, unspecified: Secondary | ICD-10-CM | POA: Diagnosis not present

## 2014-02-24 DIAGNOSIS — K219 Gastro-esophageal reflux disease without esophagitis: Secondary | ICD-10-CM | POA: Diagnosis not present

## 2014-03-12 DIAGNOSIS — M7989 Other specified soft tissue disorders: Secondary | ICD-10-CM | POA: Diagnosis not present

## 2014-03-12 DIAGNOSIS — I83893 Varicose veins of bilateral lower extremities with other complications: Secondary | ICD-10-CM | POA: Diagnosis not present

## 2014-03-12 DIAGNOSIS — M79609 Pain in unspecified limb: Secondary | ICD-10-CM | POA: Diagnosis not present

## 2014-03-30 DIAGNOSIS — K219 Gastro-esophageal reflux disease without esophagitis: Secondary | ICD-10-CM | POA: Diagnosis not present

## 2014-03-30 DIAGNOSIS — J309 Allergic rhinitis, unspecified: Secondary | ICD-10-CM | POA: Diagnosis not present

## 2014-03-30 DIAGNOSIS — F411 Generalized anxiety disorder: Secondary | ICD-10-CM | POA: Diagnosis not present

## 2014-03-30 DIAGNOSIS — J019 Acute sinusitis, unspecified: Secondary | ICD-10-CM | POA: Diagnosis not present

## 2014-03-30 DIAGNOSIS — F339 Major depressive disorder, recurrent, unspecified: Secondary | ICD-10-CM | POA: Diagnosis not present

## 2014-04-09 DIAGNOSIS — M7989 Other specified soft tissue disorders: Secondary | ICD-10-CM | POA: Diagnosis not present

## 2014-04-09 DIAGNOSIS — I83893 Varicose veins of bilateral lower extremities with other complications: Secondary | ICD-10-CM | POA: Diagnosis not present

## 2014-04-09 DIAGNOSIS — M79609 Pain in unspecified limb: Secondary | ICD-10-CM | POA: Diagnosis not present

## 2014-04-13 DIAGNOSIS — N400 Enlarged prostate without lower urinary tract symptoms: Secondary | ICD-10-CM | POA: Diagnosis not present

## 2014-04-15 DIAGNOSIS — Z23 Encounter for immunization: Secondary | ICD-10-CM | POA: Diagnosis not present

## 2014-04-15 DIAGNOSIS — L989 Disorder of the skin and subcutaneous tissue, unspecified: Secondary | ICD-10-CM | POA: Diagnosis not present

## 2014-04-27 DIAGNOSIS — H00029 Hordeolum internum unspecified eye, unspecified eyelid: Secondary | ICD-10-CM | POA: Diagnosis not present

## 2014-05-01 DIAGNOSIS — N318 Other neuromuscular dysfunction of bladder: Secondary | ICD-10-CM | POA: Diagnosis not present

## 2014-05-01 DIAGNOSIS — N3289 Other specified disorders of bladder: Secondary | ICD-10-CM | POA: Diagnosis not present

## 2014-05-04 DIAGNOSIS — F429 Obsessive-compulsive disorder, unspecified: Secondary | ICD-10-CM | POA: Diagnosis not present

## 2014-05-07 DIAGNOSIS — M7989 Other specified soft tissue disorders: Secondary | ICD-10-CM | POA: Diagnosis not present

## 2014-05-07 DIAGNOSIS — M79609 Pain in unspecified limb: Secondary | ICD-10-CM | POA: Diagnosis not present

## 2014-05-08 DIAGNOSIS — Z79899 Other long term (current) drug therapy: Secondary | ICD-10-CM | POA: Diagnosis not present

## 2014-05-12 DIAGNOSIS — M7989 Other specified soft tissue disorders: Secondary | ICD-10-CM | POA: Diagnosis not present

## 2014-05-12 DIAGNOSIS — M79609 Pain in unspecified limb: Secondary | ICD-10-CM | POA: Diagnosis not present

## 2014-05-13 HISTORY — PX: CLOSED REDUCTION TOE FRACTURE: SUR248

## 2014-05-27 DIAGNOSIS — L259 Unspecified contact dermatitis, unspecified cause: Secondary | ICD-10-CM | POA: Diagnosis not present

## 2014-06-11 DIAGNOSIS — S92909A Unspecified fracture of unspecified foot, initial encounter for closed fracture: Secondary | ICD-10-CM | POA: Diagnosis not present

## 2014-06-11 DIAGNOSIS — W208XXA Other cause of strike by thrown, projected or falling object, initial encounter: Secondary | ICD-10-CM | POA: Diagnosis not present

## 2014-06-11 DIAGNOSIS — S92309A Fracture of unspecified metatarsal bone(s), unspecified foot, initial encounter for closed fracture: Secondary | ICD-10-CM | POA: Diagnosis not present

## 2014-06-11 DIAGNOSIS — Y9269 Other specified industrial and construction area as the place of occurrence of the external cause: Secondary | ICD-10-CM | POA: Diagnosis not present

## 2014-06-17 DIAGNOSIS — S92309A Fracture of unspecified metatarsal bone(s), unspecified foot, initial encounter for closed fracture: Secondary | ICD-10-CM | POA: Diagnosis not present

## 2014-06-19 DIAGNOSIS — R609 Edema, unspecified: Secondary | ICD-10-CM | POA: Diagnosis not present

## 2014-06-19 DIAGNOSIS — M79609 Pain in unspecified limb: Secondary | ICD-10-CM | POA: Diagnosis not present

## 2014-06-22 DIAGNOSIS — S92309A Fracture of unspecified metatarsal bone(s), unspecified foot, initial encounter for closed fracture: Secondary | ICD-10-CM | POA: Diagnosis not present

## 2014-06-25 DIAGNOSIS — Y998 Other external cause status: Secondary | ICD-10-CM | POA: Diagnosis not present

## 2014-06-25 DIAGNOSIS — IMO0002 Reserved for concepts with insufficient information to code with codable children: Secondary | ICD-10-CM | POA: Diagnosis not present

## 2014-06-25 DIAGNOSIS — Y929 Unspecified place or not applicable: Secondary | ICD-10-CM | POA: Diagnosis not present

## 2014-06-25 DIAGNOSIS — S92309A Fracture of unspecified metatarsal bone(s), unspecified foot, initial encounter for closed fracture: Secondary | ICD-10-CM | POA: Diagnosis not present

## 2014-06-25 DIAGNOSIS — Y9389 Activity, other specified: Secondary | ICD-10-CM | POA: Diagnosis not present

## 2014-06-25 DIAGNOSIS — G8918 Other acute postprocedural pain: Secondary | ICD-10-CM | POA: Diagnosis not present

## 2014-06-30 DIAGNOSIS — S92309A Fracture of unspecified metatarsal bone(s), unspecified foot, initial encounter for closed fracture: Secondary | ICD-10-CM | POA: Diagnosis not present

## 2014-07-06 DIAGNOSIS — N138 Other obstructive and reflux uropathy: Secondary | ICD-10-CM | POA: Diagnosis not present

## 2014-07-06 DIAGNOSIS — N401 Enlarged prostate with lower urinary tract symptoms: Secondary | ICD-10-CM | POA: Diagnosis not present

## 2014-07-06 DIAGNOSIS — N318 Other neuromuscular dysfunction of bladder: Secondary | ICD-10-CM | POA: Diagnosis not present

## 2014-07-06 DIAGNOSIS — Z125 Encounter for screening for malignant neoplasm of prostate: Secondary | ICD-10-CM | POA: Diagnosis not present

## 2014-07-09 DIAGNOSIS — S92309A Fracture of unspecified metatarsal bone(s), unspecified foot, initial encounter for closed fracture: Secondary | ICD-10-CM | POA: Diagnosis not present

## 2014-07-14 DIAGNOSIS — R350 Frequency of micturition: Secondary | ICD-10-CM | POA: Diagnosis not present

## 2014-07-14 DIAGNOSIS — F329 Major depressive disorder, single episode, unspecified: Secondary | ICD-10-CM | POA: Diagnosis not present

## 2014-07-14 DIAGNOSIS — M79609 Pain in unspecified limb: Secondary | ICD-10-CM | POA: Diagnosis not present

## 2014-07-14 DIAGNOSIS — F3289 Other specified depressive episodes: Secondary | ICD-10-CM | POA: Diagnosis not present

## 2014-08-03 DIAGNOSIS — N318 Other neuromuscular dysfunction of bladder: Secondary | ICD-10-CM | POA: Diagnosis not present

## 2014-08-03 DIAGNOSIS — N401 Enlarged prostate with lower urinary tract symptoms: Secondary | ICD-10-CM | POA: Diagnosis not present

## 2014-08-03 DIAGNOSIS — N138 Other obstructive and reflux uropathy: Secondary | ICD-10-CM | POA: Diagnosis not present

## 2014-08-10 DIAGNOSIS — F429 Obsessive-compulsive disorder, unspecified: Secondary | ICD-10-CM | POA: Diagnosis not present

## 2014-08-12 DIAGNOSIS — S92309A Fracture of unspecified metatarsal bone(s), unspecified foot, initial encounter for closed fracture: Secondary | ICD-10-CM | POA: Diagnosis not present

## 2014-09-02 DIAGNOSIS — N401 Enlarged prostate with lower urinary tract symptoms: Secondary | ICD-10-CM | POA: Diagnosis not present

## 2014-09-02 DIAGNOSIS — N3289 Other specified disorders of bladder: Secondary | ICD-10-CM | POA: Diagnosis not present

## 2014-09-02 DIAGNOSIS — N3281 Overactive bladder: Secondary | ICD-10-CM | POA: Diagnosis not present

## 2014-09-02 DIAGNOSIS — H6690 Otitis media, unspecified, unspecified ear: Secondary | ICD-10-CM | POA: Diagnosis not present

## 2014-10-07 DIAGNOSIS — R319 Hematuria, unspecified: Secondary | ICD-10-CM | POA: Diagnosis not present

## 2014-10-07 DIAGNOSIS — Z87438 Personal history of other diseases of male genital organs: Secondary | ICD-10-CM | POA: Diagnosis not present

## 2014-10-07 DIAGNOSIS — N3941 Urge incontinence: Secondary | ICD-10-CM | POA: Insufficient documentation

## 2014-10-07 DIAGNOSIS — N390 Urinary tract infection, site not specified: Secondary | ICD-10-CM | POA: Diagnosis not present

## 2014-10-13 DIAGNOSIS — L219 Seborrheic dermatitis, unspecified: Secondary | ICD-10-CM | POA: Diagnosis not present

## 2014-10-13 DIAGNOSIS — J00 Acute nasopharyngitis [common cold]: Secondary | ICD-10-CM | POA: Diagnosis not present

## 2014-10-16 DIAGNOSIS — F42 Obsessive-compulsive disorder: Secondary | ICD-10-CM | POA: Diagnosis not present

## 2014-11-13 HISTORY — PX: NASAL SEPTUM SURGERY: SHX37

## 2014-11-23 DIAGNOSIS — N3941 Urge incontinence: Secondary | ICD-10-CM | POA: Diagnosis not present

## 2014-11-23 DIAGNOSIS — N401 Enlarged prostate with lower urinary tract symptoms: Secondary | ICD-10-CM | POA: Diagnosis not present

## 2014-11-23 DIAGNOSIS — N400 Enlarged prostate without lower urinary tract symptoms: Secondary | ICD-10-CM | POA: Diagnosis not present

## 2014-11-23 DIAGNOSIS — R350 Frequency of micturition: Secondary | ICD-10-CM | POA: Diagnosis not present

## 2014-12-11 DIAGNOSIS — R0683 Snoring: Secondary | ICD-10-CM | POA: Diagnosis not present

## 2014-12-18 DIAGNOSIS — F42 Obsessive-compulsive disorder: Secondary | ICD-10-CM | POA: Diagnosis not present

## 2014-12-21 DIAGNOSIS — G4733 Obstructive sleep apnea (adult) (pediatric): Secondary | ICD-10-CM | POA: Diagnosis not present

## 2015-01-04 DIAGNOSIS — R102 Pelvic and perineal pain: Secondary | ICD-10-CM | POA: Diagnosis not present

## 2015-01-04 DIAGNOSIS — R350 Frequency of micturition: Secondary | ICD-10-CM | POA: Diagnosis not present

## 2015-01-04 DIAGNOSIS — N3941 Urge incontinence: Secondary | ICD-10-CM | POA: Diagnosis not present

## 2015-01-04 DIAGNOSIS — N401 Enlarged prostate with lower urinary tract symptoms: Secondary | ICD-10-CM | POA: Diagnosis not present

## 2015-01-13 DIAGNOSIS — N3941 Urge incontinence: Secondary | ICD-10-CM | POA: Diagnosis not present

## 2015-01-18 DIAGNOSIS — K449 Diaphragmatic hernia without obstruction or gangrene: Secondary | ICD-10-CM | POA: Diagnosis not present

## 2015-01-18 DIAGNOSIS — K429 Umbilical hernia without obstruction or gangrene: Secondary | ICD-10-CM | POA: Diagnosis not present

## 2015-01-18 DIAGNOSIS — K5790 Diverticulosis of intestine, part unspecified, without perforation or abscess without bleeding: Secondary | ICD-10-CM | POA: Diagnosis not present

## 2015-01-18 DIAGNOSIS — N3289 Other specified disorders of bladder: Secondary | ICD-10-CM | POA: Diagnosis not present

## 2015-01-18 DIAGNOSIS — N368 Other specified disorders of urethra: Secondary | ICD-10-CM | POA: Diagnosis not present

## 2015-01-18 DIAGNOSIS — K7689 Other specified diseases of liver: Secondary | ICD-10-CM | POA: Diagnosis not present

## 2015-01-18 DIAGNOSIS — Z9889 Other specified postprocedural states: Secondary | ICD-10-CM | POA: Diagnosis not present

## 2015-01-18 DIAGNOSIS — R102 Pelvic and perineal pain: Secondary | ICD-10-CM | POA: Diagnosis not present

## 2015-03-19 DIAGNOSIS — F42 Obsessive-compulsive disorder: Secondary | ICD-10-CM | POA: Diagnosis not present

## 2015-04-05 DIAGNOSIS — R102 Pelvic and perineal pain: Secondary | ICD-10-CM | POA: Diagnosis not present

## 2015-04-05 DIAGNOSIS — Z87448 Personal history of other diseases of urinary system: Secondary | ICD-10-CM | POA: Diagnosis not present

## 2015-04-14 DIAGNOSIS — J329 Chronic sinusitis, unspecified: Secondary | ICD-10-CM | POA: Diagnosis not present

## 2015-04-14 DIAGNOSIS — N259 Disorder resulting from impaired renal tubular function, unspecified: Secondary | ICD-10-CM | POA: Diagnosis not present

## 2015-04-14 DIAGNOSIS — N529 Male erectile dysfunction, unspecified: Secondary | ICD-10-CM | POA: Diagnosis not present

## 2015-04-14 DIAGNOSIS — N3943 Post-void dribbling: Secondary | ICD-10-CM | POA: Diagnosis not present

## 2015-05-10 DIAGNOSIS — B009 Herpesviral infection, unspecified: Secondary | ICD-10-CM | POA: Diagnosis not present

## 2015-05-14 DIAGNOSIS — B009 Herpesviral infection, unspecified: Secondary | ICD-10-CM | POA: Diagnosis not present

## 2015-05-19 DIAGNOSIS — N401 Enlarged prostate with lower urinary tract symptoms: Secondary | ICD-10-CM | POA: Diagnosis not present

## 2015-05-19 DIAGNOSIS — R3915 Urgency of urination: Secondary | ICD-10-CM | POA: Diagnosis not present

## 2015-05-19 DIAGNOSIS — R350 Frequency of micturition: Secondary | ICD-10-CM | POA: Diagnosis not present

## 2015-05-28 DIAGNOSIS — H01113 Allergic dermatitis of right eye, unspecified eyelid: Secondary | ICD-10-CM | POA: Diagnosis not present

## 2015-06-01 DIAGNOSIS — B009 Herpesviral infection, unspecified: Secondary | ICD-10-CM | POA: Diagnosis not present

## 2015-06-01 DIAGNOSIS — L03211 Cellulitis of face: Secondary | ICD-10-CM | POA: Diagnosis not present

## 2015-06-07 DIAGNOSIS — M0239 Reiter's disease, multiple sites: Secondary | ICD-10-CM | POA: Diagnosis not present

## 2015-06-07 DIAGNOSIS — Z1589 Genetic susceptibility to other disease: Secondary | ICD-10-CM | POA: Diagnosis not present

## 2015-06-18 DIAGNOSIS — F42 Obsessive-compulsive disorder: Secondary | ICD-10-CM | POA: Diagnosis not present

## 2015-06-25 DIAGNOSIS — R21 Rash and other nonspecific skin eruption: Secondary | ICD-10-CM | POA: Diagnosis not present

## 2015-06-25 DIAGNOSIS — L739 Follicular disorder, unspecified: Secondary | ICD-10-CM | POA: Diagnosis not present

## 2015-07-01 DIAGNOSIS — L739 Follicular disorder, unspecified: Secondary | ICD-10-CM | POA: Diagnosis not present

## 2015-07-01 DIAGNOSIS — R21 Rash and other nonspecific skin eruption: Secondary | ICD-10-CM | POA: Diagnosis not present

## 2015-07-07 DIAGNOSIS — L308 Other specified dermatitis: Secondary | ICD-10-CM | POA: Diagnosis not present

## 2015-07-07 DIAGNOSIS — L300 Nummular dermatitis: Secondary | ICD-10-CM | POA: Diagnosis not present

## 2015-07-16 DIAGNOSIS — E780 Pure hypercholesterolemia: Secondary | ICD-10-CM | POA: Diagnosis not present

## 2015-07-16 DIAGNOSIS — R21 Rash and other nonspecific skin eruption: Secondary | ICD-10-CM | POA: Diagnosis not present

## 2015-07-16 DIAGNOSIS — E785 Hyperlipidemia, unspecified: Secondary | ICD-10-CM | POA: Diagnosis not present

## 2015-07-23 DIAGNOSIS — F42 Obsessive-compulsive disorder: Secondary | ICD-10-CM | POA: Diagnosis not present

## 2015-07-23 DIAGNOSIS — Z23 Encounter for immunization: Secondary | ICD-10-CM | POA: Diagnosis not present

## 2015-08-18 DIAGNOSIS — N39498 Other specified urinary incontinence: Secondary | ICD-10-CM | POA: Diagnosis not present

## 2015-08-18 DIAGNOSIS — N359 Urethral stricture, unspecified: Secondary | ICD-10-CM | POA: Diagnosis not present

## 2015-08-23 DIAGNOSIS — R194 Change in bowel habit: Secondary | ICD-10-CM | POA: Diagnosis not present

## 2015-08-23 DIAGNOSIS — Z86010 Personal history of colonic polyps: Secondary | ICD-10-CM | POA: Diagnosis not present

## 2015-09-13 DIAGNOSIS — B009 Herpesviral infection, unspecified: Secondary | ICD-10-CM | POA: Diagnosis not present

## 2015-10-22 DIAGNOSIS — F422 Mixed obsessional thoughts and acts: Secondary | ICD-10-CM | POA: Diagnosis not present

## 2015-11-01 DIAGNOSIS — B001 Herpesviral vesicular dermatitis: Secondary | ICD-10-CM | POA: Diagnosis not present

## 2015-11-30 DIAGNOSIS — B001 Herpesviral vesicular dermatitis: Secondary | ICD-10-CM | POA: Diagnosis not present

## 2016-01-21 DIAGNOSIS — F422 Mixed obsessional thoughts and acts: Secondary | ICD-10-CM | POA: Diagnosis not present

## 2016-01-28 DIAGNOSIS — B009 Herpesviral infection, unspecified: Secondary | ICD-10-CM | POA: Diagnosis not present

## 2016-01-28 DIAGNOSIS — E785 Hyperlipidemia, unspecified: Secondary | ICD-10-CM | POA: Diagnosis not present

## 2016-02-14 DIAGNOSIS — R350 Frequency of micturition: Secondary | ICD-10-CM | POA: Diagnosis not present

## 2016-02-14 DIAGNOSIS — N401 Enlarged prostate with lower urinary tract symptoms: Secondary | ICD-10-CM | POA: Diagnosis not present

## 2016-02-14 DIAGNOSIS — N3941 Urge incontinence: Secondary | ICD-10-CM | POA: Diagnosis not present

## 2016-03-08 DIAGNOSIS — R339 Retention of urine, unspecified: Secondary | ICD-10-CM | POA: Diagnosis not present

## 2016-03-08 DIAGNOSIS — R350 Frequency of micturition: Secondary | ICD-10-CM | POA: Diagnosis not present

## 2016-03-21 DIAGNOSIS — N390 Urinary tract infection, site not specified: Secondary | ICD-10-CM | POA: Diagnosis not present

## 2016-03-21 DIAGNOSIS — R309 Painful micturition, unspecified: Secondary | ICD-10-CM | POA: Diagnosis not present

## 2016-03-21 DIAGNOSIS — M25512 Pain in left shoulder: Secondary | ICD-10-CM | POA: Diagnosis not present

## 2016-03-22 DIAGNOSIS — M25512 Pain in left shoulder: Secondary | ICD-10-CM | POA: Diagnosis not present

## 2016-03-22 DIAGNOSIS — M19012 Primary osteoarthritis, left shoulder: Secondary | ICD-10-CM | POA: Diagnosis not present

## 2016-03-23 DIAGNOSIS — N358 Other urethral stricture: Secondary | ICD-10-CM | POA: Diagnosis not present

## 2016-03-23 DIAGNOSIS — N400 Enlarged prostate without lower urinary tract symptoms: Secondary | ICD-10-CM | POA: Diagnosis not present

## 2016-03-28 DIAGNOSIS — M75102 Unspecified rotator cuff tear or rupture of left shoulder, not specified as traumatic: Secondary | ICD-10-CM | POA: Diagnosis not present

## 2016-03-28 DIAGNOSIS — M25512 Pain in left shoulder: Secondary | ICD-10-CM | POA: Diagnosis not present

## 2016-03-28 DIAGNOSIS — M75112 Incomplete rotator cuff tear or rupture of left shoulder, not specified as traumatic: Secondary | ICD-10-CM | POA: Diagnosis not present

## 2016-03-28 DIAGNOSIS — M7552 Bursitis of left shoulder: Secondary | ICD-10-CM | POA: Diagnosis not present

## 2016-03-28 DIAGNOSIS — M19012 Primary osteoarthritis, left shoulder: Secondary | ICD-10-CM | POA: Diagnosis not present

## 2016-03-31 DIAGNOSIS — N401 Enlarged prostate with lower urinary tract symptoms: Secondary | ICD-10-CM | POA: Diagnosis not present

## 2016-03-31 DIAGNOSIS — N3943 Post-void dribbling: Secondary | ICD-10-CM | POA: Diagnosis not present

## 2016-03-31 DIAGNOSIS — R350 Frequency of micturition: Secondary | ICD-10-CM | POA: Diagnosis not present

## 2016-04-06 DIAGNOSIS — M7542 Impingement syndrome of left shoulder: Secondary | ICD-10-CM | POA: Diagnosis not present

## 2016-04-25 DIAGNOSIS — F422 Mixed obsessional thoughts and acts: Secondary | ICD-10-CM | POA: Diagnosis not present

## 2016-08-11 DIAGNOSIS — M25512 Pain in left shoulder: Secondary | ICD-10-CM | POA: Diagnosis not present

## 2016-08-11 DIAGNOSIS — M25612 Stiffness of left shoulder, not elsewhere classified: Secondary | ICD-10-CM | POA: Diagnosis not present

## 2016-08-15 DIAGNOSIS — M25512 Pain in left shoulder: Secondary | ICD-10-CM | POA: Diagnosis not present

## 2016-08-15 DIAGNOSIS — M25612 Stiffness of left shoulder, not elsewhere classified: Secondary | ICD-10-CM | POA: Diagnosis not present

## 2016-08-18 DIAGNOSIS — M25512 Pain in left shoulder: Secondary | ICD-10-CM | POA: Diagnosis not present

## 2016-08-18 DIAGNOSIS — M25612 Stiffness of left shoulder, not elsewhere classified: Secondary | ICD-10-CM | POA: Diagnosis not present

## 2016-08-21 DIAGNOSIS — B001 Herpesviral vesicular dermatitis: Secondary | ICD-10-CM | POA: Diagnosis not present

## 2016-08-22 DIAGNOSIS — M25612 Stiffness of left shoulder, not elsewhere classified: Secondary | ICD-10-CM | POA: Diagnosis not present

## 2016-08-22 DIAGNOSIS — M25512 Pain in left shoulder: Secondary | ICD-10-CM | POA: Diagnosis not present

## 2016-08-25 DIAGNOSIS — M25512 Pain in left shoulder: Secondary | ICD-10-CM | POA: Diagnosis not present

## 2016-08-25 DIAGNOSIS — M25612 Stiffness of left shoulder, not elsewhere classified: Secondary | ICD-10-CM | POA: Diagnosis not present

## 2016-08-28 DIAGNOSIS — M25612 Stiffness of left shoulder, not elsewhere classified: Secondary | ICD-10-CM | POA: Diagnosis not present

## 2016-08-28 DIAGNOSIS — M25512 Pain in left shoulder: Secondary | ICD-10-CM | POA: Diagnosis not present

## 2016-08-31 DIAGNOSIS — M25612 Stiffness of left shoulder, not elsewhere classified: Secondary | ICD-10-CM | POA: Diagnosis not present

## 2016-08-31 DIAGNOSIS — M25512 Pain in left shoulder: Secondary | ICD-10-CM | POA: Diagnosis not present

## 2016-09-05 DIAGNOSIS — M25512 Pain in left shoulder: Secondary | ICD-10-CM | POA: Diagnosis not present

## 2016-09-05 DIAGNOSIS — M25612 Stiffness of left shoulder, not elsewhere classified: Secondary | ICD-10-CM | POA: Diagnosis not present

## 2016-09-07 DIAGNOSIS — M25612 Stiffness of left shoulder, not elsewhere classified: Secondary | ICD-10-CM | POA: Diagnosis not present

## 2016-09-07 DIAGNOSIS — M25512 Pain in left shoulder: Secondary | ICD-10-CM | POA: Diagnosis not present

## 2016-09-12 DIAGNOSIS — M25612 Stiffness of left shoulder, not elsewhere classified: Secondary | ICD-10-CM | POA: Diagnosis not present

## 2016-09-12 DIAGNOSIS — M25512 Pain in left shoulder: Secondary | ICD-10-CM | POA: Diagnosis not present

## 2016-09-14 DIAGNOSIS — M25612 Stiffness of left shoulder, not elsewhere classified: Secondary | ICD-10-CM | POA: Diagnosis not present

## 2016-09-14 DIAGNOSIS — M25512 Pain in left shoulder: Secondary | ICD-10-CM | POA: Diagnosis not present

## 2016-09-19 DIAGNOSIS — M25512 Pain in left shoulder: Secondary | ICD-10-CM | POA: Diagnosis not present

## 2016-09-19 DIAGNOSIS — M25612 Stiffness of left shoulder, not elsewhere classified: Secondary | ICD-10-CM | POA: Diagnosis not present

## 2016-09-22 DIAGNOSIS — M25512 Pain in left shoulder: Secondary | ICD-10-CM | POA: Diagnosis not present

## 2016-09-22 DIAGNOSIS — M25612 Stiffness of left shoulder, not elsewhere classified: Secondary | ICD-10-CM | POA: Diagnosis not present

## 2016-09-26 DIAGNOSIS — M25612 Stiffness of left shoulder, not elsewhere classified: Secondary | ICD-10-CM | POA: Diagnosis not present

## 2016-09-26 DIAGNOSIS — M25512 Pain in left shoulder: Secondary | ICD-10-CM | POA: Diagnosis not present

## 2016-09-29 DIAGNOSIS — M25512 Pain in left shoulder: Secondary | ICD-10-CM | POA: Diagnosis not present

## 2016-09-29 DIAGNOSIS — M25612 Stiffness of left shoulder, not elsewhere classified: Secondary | ICD-10-CM | POA: Diagnosis not present

## 2016-10-03 DIAGNOSIS — M25512 Pain in left shoulder: Secondary | ICD-10-CM | POA: Diagnosis not present

## 2016-10-03 DIAGNOSIS — M25612 Stiffness of left shoulder, not elsewhere classified: Secondary | ICD-10-CM | POA: Diagnosis not present

## 2016-10-06 DIAGNOSIS — M25512 Pain in left shoulder: Secondary | ICD-10-CM | POA: Diagnosis not present

## 2016-10-06 DIAGNOSIS — M25612 Stiffness of left shoulder, not elsewhere classified: Secondary | ICD-10-CM | POA: Diagnosis not present

## 2016-10-13 DIAGNOSIS — R143 Flatulence: Secondary | ICD-10-CM | POA: Diagnosis not present

## 2016-10-13 DIAGNOSIS — R159 Full incontinence of feces: Secondary | ICD-10-CM | POA: Diagnosis not present

## 2016-10-13 DIAGNOSIS — R350 Frequency of micturition: Secondary | ICD-10-CM | POA: Diagnosis not present

## 2016-10-13 DIAGNOSIS — N401 Enlarged prostate with lower urinary tract symptoms: Secondary | ICD-10-CM | POA: Diagnosis not present

## 2016-10-13 DIAGNOSIS — N3943 Post-void dribbling: Secondary | ICD-10-CM | POA: Diagnosis not present

## 2016-10-16 ENCOUNTER — Ambulatory Visit (INDEPENDENT_AMBULATORY_CARE_PROVIDER_SITE_OTHER): Payer: Medicare Other | Admitting: Orthopaedic Surgery

## 2016-10-16 ENCOUNTER — Encounter (INDEPENDENT_AMBULATORY_CARE_PROVIDER_SITE_OTHER): Payer: Self-pay | Admitting: Orthopaedic Surgery

## 2016-10-16 ENCOUNTER — Ambulatory Visit (INDEPENDENT_AMBULATORY_CARE_PROVIDER_SITE_OTHER): Payer: Medicare Other

## 2016-10-16 VITALS — BP 112/68 | HR 75 | Resp 14 | Ht 75.0 in | Wt 179.0 lb

## 2016-10-16 DIAGNOSIS — M7542 Impingement syndrome of left shoulder: Secondary | ICD-10-CM | POA: Diagnosis not present

## 2016-10-16 NOTE — Progress Notes (Signed)
   Office Visit Note   Patient: Ryan [name]           Date of Birth: 1961-01-09           MRN: [id number] Visit Date: 10/16/2016              Requested by: No referring provider defined for this encounter. PCP: Garwin Brothers, MD   Assessment & Plan: Visit Diagnoses: No diagnosis found. By a prior MRI scan Ryan [name] has a partial tear of the supraspinatus on the articular surface. He also has mild degenerative changes at the acromioclavicular joint. He is not significantly compromised  in his activities. We have had a long discussion about different treatment options and he wishes to proceed with nonoperative treatment. He will discontinue formal physical therapy and follow up with home exercises. He can always return for another subacromial cortisone injection or to consider surgery. That would include an arthroscopic subacromial decompression, distal clavicle resection and debridement of the partial rotator cuff tear. He might require a mini open rotator cuff tear repair if there was significant tearing at the time of surgery  Plan: I will plan to see Ryan [name] back on a when necessary basis. He has mild impingement left shoulder and a very mild painful arc of motion. He'll continue with home exercises through the physical therapy.  Follow-Up Instructions: No Follow-up on file.   Orders:  No orders of the defined types were placed in this encounter.  No orders of the defined types were placed in this encounter.     Procedures: No procedures performed   Clinical Data: No additional findings.   Subjective: No chief complaint on file.   Pt still has Left shoulder pain, on 04/06/16 BP sent him to PT, [place] and now is 8 weeks into the PT.  In may he did receive a cortisone shot, it did help for 6 weeks.  Does he need surgery? More therapy? [facility] in [place]    Review of Systems   Objective: Vital Signs: There were no vitals taken for this  visit.  Physical Exam  Ortho Exam examination of the left shoulder demonstrates full overhead motion. He has a very minimal painful arc between 90 and 120 of flexion. There was minimal impingement and a negative empty can test. Skin was intact. No tenderness at the acromioclavicular joint. Biceps was intact. No swelling distally. Neurovascular exam is intact.  Specialty Comments:  No specialty comments available.  Imaging: No results found.   PMFS History: There are no active problems to display for this patient.  No past medical history on file.  No family history on file.  No past surgical history on file. Social History   Occupational History  . Not on file.   Social History Main Topics  . Smoking status: Not on file  . Smokeless tobacco: Not on file  . Alcohol use Not on file  . Drug use: Unknown  . Sexual activity: Not on file

## 2016-10-17 DIAGNOSIS — M25612 Stiffness of left shoulder, not elsewhere classified: Secondary | ICD-10-CM | POA: Diagnosis not present

## 2016-10-17 DIAGNOSIS — M25512 Pain in left shoulder: Secondary | ICD-10-CM | POA: Diagnosis not present

## 2016-10-20 DIAGNOSIS — M25612 Stiffness of left shoulder, not elsewhere classified: Secondary | ICD-10-CM | POA: Diagnosis not present

## 2016-10-20 DIAGNOSIS — M25512 Pain in left shoulder: Secondary | ICD-10-CM | POA: Diagnosis not present

## 2016-10-23 DIAGNOSIS — J329 Chronic sinusitis, unspecified: Secondary | ICD-10-CM | POA: Diagnosis not present

## 2016-10-24 DIAGNOSIS — R198 Other specified symptoms and signs involving the digestive system and abdomen: Secondary | ICD-10-CM | POA: Diagnosis not present

## 2016-10-24 DIAGNOSIS — R159 Full incontinence of feces: Secondary | ICD-10-CM | POA: Diagnosis not present

## 2016-10-31 DIAGNOSIS — F422 Mixed obsessional thoughts and acts: Secondary | ICD-10-CM | POA: Diagnosis not present

## 2016-11-09 DIAGNOSIS — S0219XA Other fracture of base of skull, initial encounter for closed fracture: Secondary | ICD-10-CM | POA: Diagnosis not present

## 2016-11-09 DIAGNOSIS — J329 Chronic sinusitis, unspecified: Secondary | ICD-10-CM | POA: Diagnosis not present

## 2016-11-09 DIAGNOSIS — R6883 Chills (without fever): Secondary | ICD-10-CM | POA: Diagnosis not present

## 2016-11-09 DIAGNOSIS — J321 Chronic frontal sinusitis: Secondary | ICD-10-CM | POA: Diagnosis not present

## 2016-11-13 HISTORY — PX: RHINOPLASTY: SUR1284

## 2016-11-20 DIAGNOSIS — H6120 Impacted cerumen, unspecified ear: Secondary | ICD-10-CM | POA: Diagnosis not present

## 2016-11-20 DIAGNOSIS — J329 Chronic sinusitis, unspecified: Secondary | ICD-10-CM | POA: Diagnosis not present

## 2016-12-05 DIAGNOSIS — J329 Chronic sinusitis, unspecified: Secondary | ICD-10-CM | POA: Diagnosis not present

## 2016-12-11 DIAGNOSIS — J3489 Other specified disorders of nose and nasal sinuses: Secondary | ICD-10-CM | POA: Diagnosis not present

## 2016-12-11 DIAGNOSIS — R0981 Nasal congestion: Secondary | ICD-10-CM | POA: Diagnosis not present

## 2016-12-11 DIAGNOSIS — J329 Chronic sinusitis, unspecified: Secondary | ICD-10-CM | POA: Diagnosis not present

## 2016-12-11 DIAGNOSIS — Z9889 Other specified postprocedural states: Secondary | ICD-10-CM | POA: Diagnosis not present

## 2016-12-14 DIAGNOSIS — J329 Chronic sinusitis, unspecified: Secondary | ICD-10-CM | POA: Diagnosis not present

## 2016-12-15 DIAGNOSIS — J101 Influenza due to other identified influenza virus with other respiratory manifestations: Secondary | ICD-10-CM | POA: Diagnosis not present

## 2016-12-25 DIAGNOSIS — J069 Acute upper respiratory infection, unspecified: Secondary | ICD-10-CM | POA: Diagnosis not present

## 2016-12-25 DIAGNOSIS — J309 Allergic rhinitis, unspecified: Secondary | ICD-10-CM | POA: Diagnosis not present

## 2017-01-10 DIAGNOSIS — J329 Chronic sinusitis, unspecified: Secondary | ICD-10-CM | POA: Diagnosis not present

## 2017-01-10 DIAGNOSIS — R0981 Nasal congestion: Secondary | ICD-10-CM | POA: Diagnosis not present

## 2017-01-10 DIAGNOSIS — J0101 Acute recurrent maxillary sinusitis: Secondary | ICD-10-CM | POA: Diagnosis not present

## 2017-01-10 DIAGNOSIS — J343 Hypertrophy of nasal turbinates: Secondary | ICD-10-CM | POA: Diagnosis not present

## 2017-01-10 DIAGNOSIS — J0121 Acute recurrent ethmoidal sinusitis: Secondary | ICD-10-CM | POA: Diagnosis not present

## 2017-01-10 DIAGNOSIS — J0111 Acute recurrent frontal sinusitis: Secondary | ICD-10-CM | POA: Diagnosis not present

## 2017-01-16 DIAGNOSIS — N3943 Post-void dribbling: Secondary | ICD-10-CM | POA: Diagnosis not present

## 2017-01-16 DIAGNOSIS — N358 Other urethral stricture: Secondary | ICD-10-CM | POA: Diagnosis not present

## 2017-01-16 DIAGNOSIS — R350 Frequency of micturition: Secondary | ICD-10-CM | POA: Diagnosis not present

## 2017-01-16 DIAGNOSIS — N3941 Urge incontinence: Secondary | ICD-10-CM | POA: Diagnosis not present

## 2017-01-16 DIAGNOSIS — N401 Enlarged prostate with lower urinary tract symptoms: Secondary | ICD-10-CM | POA: Diagnosis not present

## 2017-01-16 DIAGNOSIS — N35919 Unspecified urethral stricture, male, unspecified site: Secondary | ICD-10-CM | POA: Insufficient documentation

## 2017-01-23 DIAGNOSIS — J321 Chronic frontal sinusitis: Secondary | ICD-10-CM | POA: Diagnosis not present

## 2017-01-23 DIAGNOSIS — J329 Chronic sinusitis, unspecified: Secondary | ICD-10-CM | POA: Diagnosis not present

## 2017-01-23 DIAGNOSIS — J0121 Acute recurrent ethmoidal sinusitis: Secondary | ICD-10-CM | POA: Diagnosis not present

## 2017-01-23 DIAGNOSIS — J322 Chronic ethmoidal sinusitis: Secondary | ICD-10-CM | POA: Diagnosis not present

## 2017-01-23 DIAGNOSIS — J309 Allergic rhinitis, unspecified: Secondary | ICD-10-CM | POA: Diagnosis not present

## 2017-01-23 DIAGNOSIS — K589 Irritable bowel syndrome without diarrhea: Secondary | ICD-10-CM | POA: Diagnosis not present

## 2017-01-23 DIAGNOSIS — K219 Gastro-esophageal reflux disease without esophagitis: Secondary | ICD-10-CM | POA: Diagnosis not present

## 2017-01-23 DIAGNOSIS — N401 Enlarged prostate with lower urinary tract symptoms: Secondary | ICD-10-CM | POA: Diagnosis not present

## 2017-01-23 DIAGNOSIS — R0981 Nasal congestion: Secondary | ICD-10-CM | POA: Diagnosis not present

## 2017-01-23 DIAGNOSIS — J0111 Acute recurrent frontal sinusitis: Secondary | ICD-10-CM | POA: Diagnosis not present

## 2017-01-23 DIAGNOSIS — J343 Hypertrophy of nasal turbinates: Secondary | ICD-10-CM | POA: Diagnosis not present

## 2017-01-23 DIAGNOSIS — Z87891 Personal history of nicotine dependence: Secondary | ICD-10-CM | POA: Diagnosis not present

## 2017-01-23 DIAGNOSIS — J0101 Acute recurrent maxillary sinusitis: Secondary | ICD-10-CM | POA: Diagnosis not present

## 2017-01-29 DIAGNOSIS — R32 Unspecified urinary incontinence: Secondary | ICD-10-CM | POA: Diagnosis not present

## 2017-01-29 DIAGNOSIS — R3915 Urgency of urination: Secondary | ICD-10-CM | POA: Diagnosis not present

## 2017-01-29 DIAGNOSIS — R350 Frequency of micturition: Secondary | ICD-10-CM | POA: Diagnosis not present

## 2017-01-30 DIAGNOSIS — F422 Mixed obsessional thoughts and acts: Secondary | ICD-10-CM | POA: Diagnosis not present

## 2017-01-30 DIAGNOSIS — J329 Chronic sinusitis, unspecified: Secondary | ICD-10-CM | POA: Diagnosis not present

## 2017-02-07 DIAGNOSIS — J329 Chronic sinusitis, unspecified: Secondary | ICD-10-CM | POA: Diagnosis not present

## 2017-02-16 DIAGNOSIS — J309 Allergic rhinitis, unspecified: Secondary | ICD-10-CM | POA: Diagnosis not present

## 2017-02-16 DIAGNOSIS — J310 Chronic rhinitis: Secondary | ICD-10-CM | POA: Diagnosis not present

## 2017-02-21 DIAGNOSIS — J329 Chronic sinusitis, unspecified: Secondary | ICD-10-CM | POA: Diagnosis not present

## 2017-02-23 DIAGNOSIS — N3941 Urge incontinence: Secondary | ICD-10-CM | POA: Diagnosis not present

## 2017-02-23 DIAGNOSIS — N401 Enlarged prostate with lower urinary tract symptoms: Secondary | ICD-10-CM | POA: Diagnosis not present

## 2017-02-23 DIAGNOSIS — N3943 Post-void dribbling: Secondary | ICD-10-CM | POA: Diagnosis not present

## 2017-02-23 DIAGNOSIS — N358 Other urethral stricture: Secondary | ICD-10-CM | POA: Diagnosis not present

## 2017-02-23 DIAGNOSIS — R350 Frequency of micturition: Secondary | ICD-10-CM | POA: Diagnosis not present

## 2017-03-05 DIAGNOSIS — M545 Low back pain: Secondary | ICD-10-CM | POA: Diagnosis not present

## 2017-03-05 DIAGNOSIS — M549 Dorsalgia, unspecified: Secondary | ICD-10-CM | POA: Diagnosis not present

## 2017-03-05 DIAGNOSIS — M5430 Sciatica, unspecified side: Secondary | ICD-10-CM | POA: Diagnosis not present

## 2017-03-08 DIAGNOSIS — M533 Sacrococcygeal disorders, not elsewhere classified: Secondary | ICD-10-CM | POA: Diagnosis not present

## 2017-04-03 DIAGNOSIS — M5442 Lumbago with sciatica, left side: Secondary | ICD-10-CM | POA: Diagnosis not present

## 2017-04-10 DIAGNOSIS — F422 Mixed obsessional thoughts and acts: Secondary | ICD-10-CM | POA: Diagnosis not present

## 2017-04-19 DIAGNOSIS — F429 Obsessive-compulsive disorder, unspecified: Secondary | ICD-10-CM | POA: Diagnosis not present

## 2017-04-19 DIAGNOSIS — E7800 Pure hypercholesterolemia, unspecified: Secondary | ICD-10-CM | POA: Diagnosis not present

## 2017-04-19 DIAGNOSIS — F411 Generalized anxiety disorder: Secondary | ICD-10-CM | POA: Diagnosis not present

## 2017-04-19 DIAGNOSIS — J302 Other seasonal allergic rhinitis: Secondary | ICD-10-CM | POA: Diagnosis not present

## 2017-04-19 DIAGNOSIS — M5442 Lumbago with sciatica, left side: Secondary | ICD-10-CM | POA: Diagnosis not present

## 2017-04-19 DIAGNOSIS — F331 Major depressive disorder, recurrent, moderate: Secondary | ICD-10-CM | POA: Diagnosis not present

## 2017-04-19 DIAGNOSIS — Z6821 Body mass index (BMI) 21.0-21.9, adult: Secondary | ICD-10-CM | POA: Diagnosis not present

## 2017-04-19 DIAGNOSIS — K219 Gastro-esophageal reflux disease without esophagitis: Secondary | ICD-10-CM | POA: Diagnosis not present

## 2017-04-19 DIAGNOSIS — N400 Enlarged prostate without lower urinary tract symptoms: Secondary | ICD-10-CM | POA: Diagnosis not present

## 2017-04-24 ENCOUNTER — Other Ambulatory Visit: Payer: Self-pay | Admitting: Neurosurgery

## 2017-04-24 DIAGNOSIS — M5442 Lumbago with sciatica, left side: Secondary | ICD-10-CM

## 2017-05-01 ENCOUNTER — Ambulatory Visit
Admission: RE | Admit: 2017-05-01 | Discharge: 2017-05-01 | Disposition: A | Discharge status: Home / Self Care | Payer: Medicare Other | Source: Ambulatory Visit | Attending: Neurosurgery | Admitting: Neurosurgery

## 2017-05-01 DIAGNOSIS — M5442 Lumbago with sciatica, left side: Secondary | ICD-10-CM

## 2017-05-01 DIAGNOSIS — M48061 Spinal stenosis, lumbar region without neurogenic claudication: Secondary | ICD-10-CM | POA: Diagnosis not present

## 2017-05-15 DIAGNOSIS — R030 Elevated blood-pressure reading, without diagnosis of hypertension: Secondary | ICD-10-CM | POA: Diagnosis not present

## 2017-05-15 DIAGNOSIS — M545 Low back pain: Secondary | ICD-10-CM | POA: Diagnosis not present

## 2017-06-28 DIAGNOSIS — J0100 Acute maxillary sinusitis, unspecified: Secondary | ICD-10-CM | POA: Diagnosis not present

## 2017-06-28 DIAGNOSIS — T148XXA Other injury of unspecified body region, initial encounter: Secondary | ICD-10-CM | POA: Diagnosis not present

## 2017-06-28 DIAGNOSIS — M79605 Pain in left leg: Secondary | ICD-10-CM | POA: Diagnosis not present

## 2017-06-28 DIAGNOSIS — M545 Low back pain: Secondary | ICD-10-CM | POA: Diagnosis not present

## 2017-06-28 DIAGNOSIS — R300 Dysuria: Secondary | ICD-10-CM | POA: Diagnosis not present

## 2017-06-29 DIAGNOSIS — M7981 Nontraumatic hematoma of soft tissue: Secondary | ICD-10-CM | POA: Diagnosis not present

## 2017-06-29 DIAGNOSIS — M79606 Pain in leg, unspecified: Secondary | ICD-10-CM | POA: Diagnosis not present

## 2017-07-10 DIAGNOSIS — F422 Mixed obsessional thoughts and acts: Secondary | ICD-10-CM | POA: Diagnosis not present

## 2017-07-12 DIAGNOSIS — T148XXA Other injury of unspecified body region, initial encounter: Secondary | ICD-10-CM | POA: Diagnosis not present

## 2017-07-13 DIAGNOSIS — M545 Low back pain: Secondary | ICD-10-CM | POA: Diagnosis not present

## 2017-07-13 DIAGNOSIS — M6281 Muscle weakness (generalized): Secondary | ICD-10-CM | POA: Diagnosis not present

## 2017-07-13 DIAGNOSIS — R2689 Other abnormalities of gait and mobility: Secondary | ICD-10-CM | POA: Diagnosis not present

## 2017-07-19 DIAGNOSIS — M545 Low back pain: Secondary | ICD-10-CM | POA: Diagnosis not present

## 2017-07-19 DIAGNOSIS — M6281 Muscle weakness (generalized): Secondary | ICD-10-CM | POA: Diagnosis not present

## 2017-07-19 DIAGNOSIS — R2689 Other abnormalities of gait and mobility: Secondary | ICD-10-CM | POA: Diagnosis not present

## 2017-07-23 DIAGNOSIS — R2689 Other abnormalities of gait and mobility: Secondary | ICD-10-CM | POA: Diagnosis not present

## 2017-07-23 DIAGNOSIS — M545 Low back pain: Secondary | ICD-10-CM | POA: Diagnosis not present

## 2017-07-23 DIAGNOSIS — M6281 Muscle weakness (generalized): Secondary | ICD-10-CM | POA: Diagnosis not present

## 2017-07-25 DIAGNOSIS — R2689 Other abnormalities of gait and mobility: Secondary | ICD-10-CM | POA: Diagnosis not present

## 2017-07-25 DIAGNOSIS — M6281 Muscle weakness (generalized): Secondary | ICD-10-CM | POA: Diagnosis not present

## 2017-07-25 DIAGNOSIS — M545 Low back pain: Secondary | ICD-10-CM | POA: Diagnosis not present

## 2017-08-03 DIAGNOSIS — M545 Low back pain: Secondary | ICD-10-CM | POA: Diagnosis not present

## 2017-08-03 DIAGNOSIS — M6281 Muscle weakness (generalized): Secondary | ICD-10-CM | POA: Diagnosis not present

## 2017-08-03 DIAGNOSIS — R2689 Other abnormalities of gait and mobility: Secondary | ICD-10-CM | POA: Diagnosis not present

## 2017-08-07 DIAGNOSIS — J329 Chronic sinusitis, unspecified: Secondary | ICD-10-CM | POA: Diagnosis not present

## 2017-08-07 DIAGNOSIS — M545 Low back pain: Secondary | ICD-10-CM | POA: Diagnosis not present

## 2017-08-07 DIAGNOSIS — M6281 Muscle weakness (generalized): Secondary | ICD-10-CM | POA: Diagnosis not present

## 2017-08-07 DIAGNOSIS — Z8709 Personal history of other diseases of the respiratory system: Secondary | ICD-10-CM | POA: Diagnosis not present

## 2017-08-07 DIAGNOSIS — R0981 Nasal congestion: Secondary | ICD-10-CM | POA: Diagnosis not present

## 2017-08-07 DIAGNOSIS — R2689 Other abnormalities of gait and mobility: Secondary | ICD-10-CM | POA: Diagnosis not present

## 2017-08-07 DIAGNOSIS — J3489 Other specified disorders of nose and nasal sinuses: Secondary | ICD-10-CM | POA: Diagnosis not present

## 2017-08-08 DIAGNOSIS — Z7251 High risk heterosexual behavior: Secondary | ICD-10-CM | POA: Diagnosis not present

## 2017-08-13 DIAGNOSIS — R2689 Other abnormalities of gait and mobility: Secondary | ICD-10-CM | POA: Diagnosis not present

## 2017-08-13 DIAGNOSIS — M6281 Muscle weakness (generalized): Secondary | ICD-10-CM | POA: Diagnosis not present

## 2017-08-13 DIAGNOSIS — M545 Low back pain: Secondary | ICD-10-CM | POA: Diagnosis not present

## 2017-08-16 DIAGNOSIS — M6281 Muscle weakness (generalized): Secondary | ICD-10-CM | POA: Diagnosis not present

## 2017-08-16 DIAGNOSIS — M545 Low back pain: Secondary | ICD-10-CM | POA: Diagnosis not present

## 2017-08-16 DIAGNOSIS — R2689 Other abnormalities of gait and mobility: Secondary | ICD-10-CM | POA: Diagnosis not present

## 2017-08-22 DIAGNOSIS — M545 Low back pain: Secondary | ICD-10-CM | POA: Diagnosis not present

## 2017-08-22 DIAGNOSIS — R2689 Other abnormalities of gait and mobility: Secondary | ICD-10-CM | POA: Diagnosis not present

## 2017-08-22 DIAGNOSIS — M6281 Muscle weakness (generalized): Secondary | ICD-10-CM | POA: Diagnosis not present

## 2017-08-23 DIAGNOSIS — Z7251 High risk heterosexual behavior: Secondary | ICD-10-CM | POA: Diagnosis not present

## 2017-08-23 DIAGNOSIS — Z23 Encounter for immunization: Secondary | ICD-10-CM | POA: Diagnosis not present

## 2017-08-24 DIAGNOSIS — Z7251 High risk heterosexual behavior: Secondary | ICD-10-CM | POA: Diagnosis not present

## 2017-08-30 DIAGNOSIS — M6281 Muscle weakness (generalized): Secondary | ICD-10-CM | POA: Diagnosis not present

## 2017-08-30 DIAGNOSIS — R2689 Other abnormalities of gait and mobility: Secondary | ICD-10-CM | POA: Diagnosis not present

## 2017-08-30 DIAGNOSIS — M545 Low back pain: Secondary | ICD-10-CM | POA: Diagnosis not present

## 2017-08-31 DIAGNOSIS — R2689 Other abnormalities of gait and mobility: Secondary | ICD-10-CM | POA: Diagnosis not present

## 2017-08-31 DIAGNOSIS — M545 Low back pain: Secondary | ICD-10-CM | POA: Diagnosis not present

## 2017-08-31 DIAGNOSIS — M6281 Muscle weakness (generalized): Secondary | ICD-10-CM | POA: Diagnosis not present

## 2017-10-02 DIAGNOSIS — B349 Viral infection, unspecified: Secondary | ICD-10-CM | POA: Diagnosis not present

## 2017-10-02 DIAGNOSIS — J3489 Other specified disorders of nose and nasal sinuses: Secondary | ICD-10-CM | POA: Diagnosis not present

## 2017-10-02 DIAGNOSIS — Z9889 Other specified postprocedural states: Secondary | ICD-10-CM | POA: Diagnosis not present

## 2017-10-10 DIAGNOSIS — F422 Mixed obsessional thoughts and acts: Secondary | ICD-10-CM | POA: Diagnosis not present

## 2017-10-17 DIAGNOSIS — E7800 Pure hypercholesterolemia, unspecified: Secondary | ICD-10-CM | POA: Diagnosis not present

## 2017-12-03 DIAGNOSIS — Z7251 High risk heterosexual behavior: Secondary | ICD-10-CM | POA: Diagnosis not present

## 2017-12-03 DIAGNOSIS — J0101 Acute recurrent maxillary sinusitis: Secondary | ICD-10-CM | POA: Diagnosis not present

## 2017-12-11 DIAGNOSIS — N401 Enlarged prostate with lower urinary tract symptoms: Secondary | ICD-10-CM | POA: Diagnosis not present

## 2017-12-11 DIAGNOSIS — N3941 Urge incontinence: Secondary | ICD-10-CM | POA: Diagnosis not present

## 2017-12-11 DIAGNOSIS — R350 Frequency of micturition: Secondary | ICD-10-CM | POA: Diagnosis not present

## 2017-12-11 DIAGNOSIS — N3943 Post-void dribbling: Secondary | ICD-10-CM | POA: Diagnosis not present

## 2017-12-11 DIAGNOSIS — N35819 Other urethral stricture, male, unspecified site: Secondary | ICD-10-CM | POA: Diagnosis not present

## 2018-01-07 DIAGNOSIS — S00411A Abrasion of right ear, initial encounter: Secondary | ICD-10-CM | POA: Diagnosis not present

## 2018-01-07 DIAGNOSIS — J019 Acute sinusitis, unspecified: Secondary | ICD-10-CM | POA: Diagnosis not present

## 2018-01-22 DIAGNOSIS — N3281 Overactive bladder: Secondary | ICD-10-CM | POA: Diagnosis not present

## 2018-01-22 DIAGNOSIS — N3941 Urge incontinence: Secondary | ICD-10-CM | POA: Diagnosis not present

## 2018-01-22 DIAGNOSIS — M5136 Other intervertebral disc degeneration, lumbar region: Secondary | ICD-10-CM | POA: Diagnosis not present

## 2018-01-22 DIAGNOSIS — M4316 Spondylolisthesis, lumbar region: Secondary | ICD-10-CM | POA: Diagnosis not present

## 2018-01-22 DIAGNOSIS — M545 Low back pain: Secondary | ICD-10-CM | POA: Diagnosis not present

## 2018-01-23 DIAGNOSIS — Z9109 Other allergy status, other than to drugs and biological substances: Secondary | ICD-10-CM | POA: Diagnosis not present

## 2018-01-23 DIAGNOSIS — J019 Acute sinusitis, unspecified: Secondary | ICD-10-CM | POA: Diagnosis not present

## 2018-01-23 DIAGNOSIS — H9212 Otorrhea, left ear: Secondary | ICD-10-CM | POA: Diagnosis not present

## 2018-02-04 DIAGNOSIS — R21 Rash and other nonspecific skin eruption: Secondary | ICD-10-CM | POA: Diagnosis not present

## 2018-02-08 DIAGNOSIS — N401 Enlarged prostate with lower urinary tract symptoms: Secondary | ICD-10-CM | POA: Diagnosis not present

## 2018-02-08 DIAGNOSIS — N3943 Post-void dribbling: Secondary | ICD-10-CM | POA: Diagnosis not present

## 2018-02-08 DIAGNOSIS — R350 Frequency of micturition: Secondary | ICD-10-CM | POA: Diagnosis not present

## 2018-02-08 DIAGNOSIS — N3941 Urge incontinence: Secondary | ICD-10-CM | POA: Diagnosis not present

## 2018-02-19 ENCOUNTER — Encounter: Payer: Self-pay | Admitting: Allergy

## 2018-02-19 ENCOUNTER — Ambulatory Visit (INDEPENDENT_AMBULATORY_CARE_PROVIDER_SITE_OTHER): Payer: Medicare Other | Admitting: Allergy

## 2018-02-19 VITALS — BP 124/74 | HR 80 | Temp 98.3°F | Resp 16 | Ht 73.5 in | Wt 173.0 lb

## 2018-02-19 DIAGNOSIS — J3089 Other allergic rhinitis: Secondary | ICD-10-CM

## 2018-02-19 MED ORDER — AZELASTINE HCL 0.1 % NA SOLN: NASAL | 5 refills | Status: DC

## 2018-02-19 NOTE — Progress Notes (Signed)
New Patient Note  RE: Ryan [name] MRN: [id number] DOB: Jan 27, 1961 Date of Office Visit: 02/19/2018  Referring provider: Cletis Athens, MD Primary care provider: Townsend Roger, MD  Chief Complaint: nasal allergy  History of present illness: Ryan [name] is a 57 y.o. male presenting today for consultation for allergies and testing.  He was referred today by his ENT for allergy testing.    He reports he has allergies.  Symptoms include frontal HA, nasal pressure and congestion, fatigue.  He has seen PA Rolena Infante with [facility] with recent appointment from 01/23/18.  He had nasal endoscopy performed that day reported to have surgical b/l changes (history of septal deviation surgery in 2014), maxillary antrostomy and ethmoid sinuses clear, sphenoethmoidal recess clear b/l.  He states he had finish antibiotics prior to having endoscopy done for a sinusitis.  He states this year he has been treated 2-3 times with antibiotics for sinusitis and in 2018 believes he had about 4-5 antibiotic courses for sinusitis symptoms.   He has taken a variety of OTC allergy medications in the past(Claritin, Zyrtec - made drowsy).  He is currently taking Xyzal daily and Fluticasone 2 sprays each nostril daily.  He does feel the xyzal and fluticasone are helpful but are only temporary relief medications.  He also states he had been doing a "homeopathic" nasal spray targeted for nasal congestion; he thinks it is helpful.  He also uses Ayr nasal gel with aloe which he states helps with preventing nasal sores.  Also has used nasal saline spray as well.   He denies history of asthma, eczema or food allergy.  He does state he has a rash on his scalp managed by his PCP that he puts a cream on.    Review of systems: Review of Systems  Constitutional: Positive for malaise/fatigue. Negative for chills and fever.  HENT: Positive for congestion and sinus pain. Negative for ear discharge, ear pain, nosebleeds and sore throat.    Eyes: Negative for pain, discharge and redness.  Respiratory: Negative for cough, shortness of breath and wheezing.   Cardiovascular: Negative for chest pain.  Gastrointestinal: Negative for abdominal pain, constipation, diarrhea, nausea and vomiting.  Genitourinary: Positive for frequency and urgency.  Musculoskeletal: Negative for joint pain.  Skin: Positive for rash.  Neurological: Positive for headaches.    All other systems negative unless noted above in HPI  Past medical history: Past Medical History:  Diagnosis Date  . OCD (obsessive compulsive disorder)   . Overactive bladder   . Sinusitis   Pt also states he is HLA-B27 positive  Past surgical history: Past Surgical History:  Procedure Laterality Date  . ADENOIDECTOMY    . PROSTATE SURGERY    . SINOSCOPY  [id number]  . TONSILLECTOMY      Family history:  Family History  Problem Relation Age of Onset  . Allergic rhinitis Mother   . Alzheimer's disease Father   . Breast cancer Maternal Grandmother     Social history: He lives in a mobile home with carpeting with kerosene heating and window cooling.  There are 2 small dogs in the home.  No concern for water damage, mildew or roaches in the home.  No smoking history.       Medication List: Allergies as of 02/19/2018      Reactions   Niacin Rash      Medication List        Accurate as of 02/19/18  3:37 PM. Always use  your most recent med list.          acetaminophen 500 MG tablet Commonly known as:  TYLENOL Take 500 mg by mouth every 6 (six) hours as needed.   emtricitabine-tenofovir 200-300 MG tablet Commonly known as:  TRUVADA Take 1 tablet by mouth daily.   fluticasone 50 MCG/ACT nasal spray Commonly known as:  FLONASE Place 1 spray into both nostrils daily.   fluvoxaMINE 100 MG tablet Commonly known as:  LUVOX Take 300 mg by mouth at bedtime.   Hydroxytryptophan L-5 Powd by Does not apply route.   Korean Panax Ginseng 100 MG Caps Take by  mouth.   L-Theanine 100 MG Caps Take by mouth.   levocetirizine 5 MG tablet Commonly known as:  XYZAL Take 5 mg by mouth every evening.   MULTIVITAMIN MEN 50+ PO Take by mouth.   MYRBETRIQ 50 MG Tb24 tablet Generic drug:  mirabegron ER Take 50 mg by mouth daily.   NF FORMULAS TESTOSTERONE PO Take by mouth.   PROBIOTIC PO Take by mouth.   RA DIGESTIVE HEALTH PO Take by mouth.   simethicone 80 MG chewable tablet Commonly known as:  MYLICON Chew 80 mg by mouth every 6 (six) hours as needed for flatulence.   Turmeric Curcumin 500 MG Caps Take by mouth.   ZICAM COLD REMEDY PO Take by mouth.       Known medication allergies: Allergies  Allergen Reactions  . Niacin Rash     Physical examination: Blood pressure 124/74, pulse 80, temperature 98.3 F (36.8 C), temperature source Oral, resp. rate 16, height 6' 1.5" (1.867 m), weight 173 lb (78.5 kg).  General: Alert, interactive, in no acute distress. HEENT: PERRLA, TMs pearly gray, turbinates minimally edematous without discharge, post-pharynx non erythematous. Neck: Supple without lymphadenopathy. Lungs: Clear to auscultation without wheezing, rhonchi or rales. {no increased work of breathing. CV: Normal S1, S2 without murmurs. Abdomen: Nondistended, nontender. Skin: Warm and dry, without lesions or rashes. Extremities:  No clubbing, cyanosis or edema. Neuro:   Grossly intact.  Diagnositics/Labs: Allergy testing: environmental allergy skin prick testing is positive to molds.  Intradermal testing is positive to mold mix 1,2,3, cat and dog Allergy testing results were read and interpreted by provider, documented by clinical staff.   Assessment and plan:   Allergic rhinitis - environmental allergy is positive to molds, cat and dog - allergen avoidance measures discussed and handouts provided - continue Xyzal 28m daily - continue Fluticasone 2 sprays each nostril daily for nasal congestion.  Use for 1-2 weeks  at a time before stopping once symptoms improve - start Astelin 2 sprays each nostril twice a day as needed for nasal drainage/post-nasal drip - continue nasal saline spray/Ayr gel to help keep nose clean and moisturized - allergen immunotherapy discussed today including protocol, benefits and risk.  Informational handout provided.  If interested in this therapuetic option you can check with your insurance carrier for coverage.  Let u[place]oreaknow if you would like to proceed with this option.    Follow-up 6 months or sooner if needed  I appreciate the opportunity to take part in [facility]. Please do not hesitate to contact me with questions.  Sincerely,   SPrudy Feeler MD Allergy/Immunology [facility]

## 2018-02-19 NOTE — Patient Instructions (Addendum)
Allergic rhinitis - environmental allergy is positive to molds, cat and dog - allergen avoidance measures discussed and handouts provided - continue Xyzal 5mg  daily - continue Fluticasone 2 sprays each nostril daily for nasal congestion.  Use for 1-2 weeks at a time before stopping once symptoms improve - start Astelin 2 sprays each nostril twice a day as needed for nasal drainage/post-nasal drip - continue nasal saline spray/Ayr gel to help keep nose clean and moisturized - allergen immunotherapy discussed today including protocol, benefits and risk.  Informational handout provided.  If interested in this therapuetic option you can check with your insurance carrier for coverage.  Let us know if you would like to proceed with this option.    Follow-up 6 months or sooner if needed

## 2018-02-26 DIAGNOSIS — R51 Headache: Secondary | ICD-10-CM | POA: Diagnosis not present

## 2018-02-26 DIAGNOSIS — Z9109 Other allergy status, other than to drugs and biological substances: Secondary | ICD-10-CM | POA: Diagnosis not present

## 2018-02-26 DIAGNOSIS — J329 Chronic sinusitis, unspecified: Secondary | ICD-10-CM | POA: Diagnosis not present

## 2018-02-26 DIAGNOSIS — J3489 Other specified disorders of nose and nasal sinuses: Secondary | ICD-10-CM | POA: Diagnosis not present

## 2018-03-04 DIAGNOSIS — B001 Herpesviral vesicular dermatitis: Secondary | ICD-10-CM | POA: Diagnosis not present

## 2018-03-04 DIAGNOSIS — Z7251 High risk heterosexual behavior: Secondary | ICD-10-CM | POA: Diagnosis not present

## 2018-03-06 DIAGNOSIS — J329 Chronic sinusitis, unspecified: Secondary | ICD-10-CM | POA: Diagnosis not present

## 2018-03-15 DIAGNOSIS — Z7251 High risk heterosexual behavior: Secondary | ICD-10-CM | POA: Diagnosis not present

## 2018-03-22 DIAGNOSIS — M5416 Radiculopathy, lumbar region: Secondary | ICD-10-CM | POA: Insufficient documentation

## 2018-04-01 DIAGNOSIS — M5136 Other intervertebral disc degeneration, lumbar region: Secondary | ICD-10-CM | POA: Diagnosis not present

## 2018-04-01 DIAGNOSIS — M5416 Radiculopathy, lumbar region: Secondary | ICD-10-CM | POA: Diagnosis not present

## 2018-04-09 DIAGNOSIS — R768 Other specified abnormal immunological findings in serum: Secondary | ICD-10-CM | POA: Diagnosis not present

## 2018-04-22 DIAGNOSIS — R768 Other specified abnormal immunological findings in serum: Secondary | ICD-10-CM | POA: Diagnosis not present

## 2018-04-22 DIAGNOSIS — F422 Mixed obsessional thoughts and acts: Secondary | ICD-10-CM | POA: Diagnosis not present

## 2018-04-25 DIAGNOSIS — S90861A Insect bite (nonvenomous), right foot, initial encounter: Secondary | ICD-10-CM | POA: Diagnosis not present

## 2018-04-28 ENCOUNTER — Encounter (HOSPITAL_COMMUNITY): Payer: Self-pay | Admitting: *Deleted

## 2018-04-28 ENCOUNTER — Other Ambulatory Visit: Payer: Self-pay

## 2018-04-28 ENCOUNTER — Inpatient Hospital Stay (HOSPITAL_COMMUNITY)
Admission: EM | Admit: 2018-04-28 | Discharge: 2018-05-01 | DRG: 603 | Disposition: A | Discharge status: Home / Self Care | Payer: Medicare HMO | Attending: Internal Medicine | Admitting: Internal Medicine

## 2018-04-28 ENCOUNTER — Emergency Department (HOSPITAL_COMMUNITY): Payer: Medicare HMO

## 2018-04-28 DIAGNOSIS — G8929 Other chronic pain: Secondary | ICD-10-CM | POA: Diagnosis not present

## 2018-04-28 DIAGNOSIS — L539 Erythematous condition, unspecified: Secondary | ICD-10-CM | POA: Diagnosis not present

## 2018-04-28 DIAGNOSIS — D7589 Other specified diseases of blood and blood-forming organs: Secondary | ICD-10-CM | POA: Diagnosis present

## 2018-04-28 DIAGNOSIS — N3281 Overactive bladder: Secondary | ICD-10-CM | POA: Diagnosis present

## 2018-04-28 DIAGNOSIS — N400 Enlarged prostate without lower urinary tract symptoms: Secondary | ICD-10-CM | POA: Diagnosis present

## 2018-04-28 DIAGNOSIS — F419 Anxiety disorder, unspecified: Secondary | ICD-10-CM | POA: Diagnosis present

## 2018-04-28 DIAGNOSIS — L0390 Cellulitis, unspecified: Secondary | ICD-10-CM | POA: Diagnosis present

## 2018-04-28 DIAGNOSIS — E440 Moderate protein-calorie malnutrition: Secondary | ICD-10-CM | POA: Diagnosis not present

## 2018-04-28 DIAGNOSIS — Z6820 Body mass index (BMI) 20.0-20.9, adult: Secondary | ICD-10-CM | POA: Diagnosis not present

## 2018-04-28 DIAGNOSIS — Z888 Allergy status to other drugs, medicaments and biological substances status: Secondary | ICD-10-CM

## 2018-04-28 DIAGNOSIS — L03115 Cellulitis of right lower limb: Secondary | ICD-10-CM | POA: Diagnosis not present

## 2018-04-28 DIAGNOSIS — Z87891 Personal history of nicotine dependence: Secondary | ICD-10-CM | POA: Diagnosis not present

## 2018-04-28 DIAGNOSIS — M545 Low back pain: Secondary | ICD-10-CM | POA: Diagnosis present

## 2018-04-28 DIAGNOSIS — R600 Localized edema: Secondary | ICD-10-CM | POA: Diagnosis not present

## 2018-04-28 DIAGNOSIS — Z79899 Other long term (current) drug therapy: Secondary | ICD-10-CM

## 2018-04-28 DIAGNOSIS — W57XXXA Bitten or stung by nonvenomous insect and other nonvenomous arthropods, initial encounter: Secondary | ICD-10-CM | POA: Diagnosis present

## 2018-04-28 DIAGNOSIS — L03116 Cellulitis of left lower limb: Secondary | ICD-10-CM | POA: Diagnosis not present

## 2018-04-28 DIAGNOSIS — S8991XA Unspecified injury of right lower leg, initial encounter: Secondary | ICD-10-CM | POA: Diagnosis not present

## 2018-04-28 DIAGNOSIS — F429 Obsessive-compulsive disorder, unspecified: Secondary | ICD-10-CM | POA: Diagnosis present

## 2018-04-28 DIAGNOSIS — R110 Nausea: Secondary | ICD-10-CM | POA: Diagnosis not present

## 2018-04-28 HISTORY — DX: Irritable bowel syndrome, unspecified: K58.9

## 2018-04-28 HISTORY — DX: Other intervertebral disc degeneration, lumbar region without mention of lumbar back pain or lower extremity pain: M51.369

## 2018-04-28 HISTORY — DX: Headache, unspecified: R51.9

## 2018-04-28 HISTORY — DX: Cellulitis of right lower limb: L03.115

## 2018-04-28 HISTORY — DX: Unspecified viral hepatitis C without hepatic coma: B19.20

## 2018-04-28 HISTORY — DX: Reiter's disease, unspecified site: M02.30

## 2018-04-28 HISTORY — DX: Other intervertebral disc degeneration, lumbar region: M51.36

## 2018-04-28 HISTORY — DX: Gastro-esophageal reflux disease without esophagitis: K21.9

## 2018-04-28 HISTORY — DX: Headache: R51

## 2018-04-28 HISTORY — DX: Benign prostatic hyperplasia without lower urinary tract symptoms: N40.0

## 2018-04-28 HISTORY — DX: Anemia, unspecified: D64.9

## 2018-04-28 HISTORY — DX: Chronic sinusitis, unspecified: J32.9

## 2018-04-28 HISTORY — DX: Unspecified osteoarthritis, unspecified site: M19.90

## 2018-04-28 HISTORY — DX: Other intervertebral disc displacement, lumbar region: M51.26

## 2018-04-28 LAB — CBC WITH DIFFERENTIAL/PLATELET
ABS IMMATURE GRANULOCYTES: 0 10*3/uL (ref 0.0–0.1)
BASOS ABS: 0 10*3/uL (ref 0.0–0.1)
Basophils Relative: 1 %
EOS PCT: 0 %
Eosinophils Absolute: 0 10*3/uL (ref 0.0–0.7)
HEMATOCRIT: 39.8 % (ref 39.0–52.0)
HEMOGLOBIN: 13.1 g/dL (ref 13.0–17.0)
IMMATURE GRANULOCYTES: 0 %
LYMPHS ABS: 0.9 10*3/uL (ref 0.7–4.0)
LYMPHS PCT: 28 %
MCH: 33.2 pg (ref 26.0–34.0)
MCHC: 32.9 g/dL (ref 30.0–36.0)
MCV: 101 fL — ABNORMAL HIGH (ref 78.0–100.0)
Monocytes Absolute: 0.5 10*3/uL (ref 0.1–1.0)
Monocytes Relative: 17 %
NEUTROS ABS: 1.6 10*3/uL — AB (ref 1.7–7.7)
NEUTROS PCT: 54 %
Platelets: 150 10*3/uL (ref 150–400)
RBC: 3.94 MIL/uL — AB (ref 4.22–5.81)
RDW: 11.8 % (ref 11.5–15.5)
WBC: 3.1 10*3/uL — AB (ref 4.0–10.5)

## 2018-04-28 LAB — URINALYSIS, ROUTINE W REFLEX MICROSCOPIC
BACTERIA UA: NONE SEEN
Bilirubin Urine: NEGATIVE
Glucose, UA: NEGATIVE mg/dL
KETONES UR: NEGATIVE mg/dL
LEUKOCYTES UA: NEGATIVE
NITRITE: NEGATIVE
PH: 5 (ref 5.0–8.0)
PROTEIN: NEGATIVE mg/dL
Specific Gravity, Urine: 1.006 (ref 1.005–1.030)

## 2018-04-28 LAB — COMPREHENSIVE METABOLIC PANEL
ALT: 16 U/L — ABNORMAL LOW (ref 17–63)
ANION GAP: 9 (ref 5–15)
AST: 26 U/L (ref 15–41)
Albumin: 3.7 g/dL (ref 3.5–5.0)
Alkaline Phosphatase: 55 U/L (ref 38–126)
BUN: 11 mg/dL (ref 6–20)
CHLORIDE: 99 mmol/L — AB (ref 101–111)
CO2: 27 mmol/L (ref 22–32)
Calcium: 8.6 mg/dL — ABNORMAL LOW (ref 8.9–10.3)
Creatinine, Ser: 1.03 mg/dL (ref 0.61–1.24)
GFR calc Af Amer: >60 mL/min (ref >60)
Glucose, Bld: 71 mg/dL (ref 65–99)
POTASSIUM: 4 mmol/L (ref 3.5–5.1)
Sodium: 135 mmol/L (ref 135–145)
TOTAL PROTEIN: 6.7 g/dL (ref 6.5–8.1)
Total Bilirubin: 0.5 mg/dL (ref 0.3–1.2)

## 2018-04-28 LAB — I-STAT CG4 LACTIC ACID, ED: Lactic Acid, Venous: 1.1 mmol/L (ref 0.5–1.9)

## 2018-04-28 MED ORDER — ACETAMINOPHEN 650 MG RE SUPP: 650.0000 mg | Freq: Four times a day (QID) | RECTAL | Status: DC | PRN

## 2018-04-28 MED ORDER — CLINDAMYCIN PHOSPHATE 600 MG/50ML IV SOLN
600.0000 mg | Freq: Once | INTRAVENOUS | Status: AC
  Administered 2018-04-28: 600 mg via INTRAVENOUS
  Filled 2018-04-28: qty 50

## 2018-04-28 MED ORDER — PIPERACILLIN-TAZOBACTAM 3.375 G IVPB
3.3750 g | Freq: Three times a day (TID) | INTRAVENOUS | Status: DC
  Administered 2018-04-29 – 2018-05-01 (×7): 3.375 g via INTRAVENOUS
  Filled 2018-04-28 (×8): qty 50

## 2018-04-28 MED ORDER — MIRABEGRON ER 25 MG PO TB24
50.0000 mg | ORAL_TABLET | Freq: Every day | ORAL | Status: DC
  Administered 2018-04-29 – 2018-04-30 (×3): 50 mg via ORAL
  Filled 2018-04-28: qty 1
  Filled 2018-04-28 (×2): qty 2

## 2018-04-28 MED ORDER — OXYCODONE HCL 5 MG PO TABS
5.0000 mg | ORAL_TABLET | Freq: Four times a day (QID) | ORAL | Status: DC | PRN
  Administered 2018-04-29 – 2018-05-01 (×5): 5 mg via ORAL
  Filled 2018-04-28 (×5): qty 1

## 2018-04-28 MED ORDER — PIPERACILLIN-TAZOBACTAM 3.375 G IVPB 30 MIN
3.3750 g | Freq: Once | INTRAVENOUS | Status: AC
  Administered 2018-04-29: 3.375 g via INTRAVENOUS
  Filled 2018-04-28: qty 50

## 2018-04-28 MED ORDER — ACETAMINOPHEN 325 MG PO TABS
650.0000 mg | ORAL_TABLET | Freq: Four times a day (QID) | ORAL | Status: DC | PRN
  Administered 2018-04-29 – 2018-04-30 (×3): 650 mg via ORAL
  Filled 2018-04-28 (×3): qty 2

## 2018-04-28 MED ORDER — VANCOMYCIN HCL IN DEXTROSE 1-5 GM/200ML-% IV SOLN
1000.0000 mg | Freq: Two times a day (BID) | INTRAVENOUS | Status: DC
  Administered 2018-04-29 – 2018-05-01 (×5): 1000 mg via INTRAVENOUS
  Filled 2018-04-28 (×5): qty 200

## 2018-04-28 MED ORDER — ONDANSETRON HCL 4 MG PO TABS: 4.0000 mg | ORAL_TABLET | Freq: Four times a day (QID) | ORAL | Status: DC | PRN

## 2018-04-28 MED ORDER — VANCOMYCIN HCL IN DEXTROSE 1-5 GM/200ML-% IV SOLN
1000.0000 mg | Freq: Once | INTRAVENOUS | Status: AC
  Administered 2018-04-29: 1000 mg via INTRAVENOUS
  Filled 2018-04-28: qty 200

## 2018-04-28 MED ORDER — FLUVOXAMINE MALEATE 100 MG PO TABS
300.0000 mg | ORAL_TABLET | Freq: Every day | ORAL | Status: DC
  Administered 2018-04-29 – 2018-04-30 (×3): 300 mg via ORAL
  Filled 2018-04-28 (×5): qty 3

## 2018-04-28 MED ORDER — ONDANSETRON HCL 4 MG/2ML IJ SOLN: 4.0000 mg | Freq: Four times a day (QID) | INTRAMUSCULAR | Status: DC | PRN

## 2018-04-28 NOTE — ED Notes (Signed)
ED Provider at bedside. 

## 2018-04-28 NOTE — Progress Notes (Signed)
Pharmacy Antibiotic Note  Ryan [name] is a 57 y.o. male admitted on 04/28/2018 with cellulitis.  Pharmacy has been consulted for Vancocin and Zosyn dosing.  Plan: Vancomycin 1000mg  IV every 12 hours.  Goal trough 10-15 mcg/mL. Zosyn 3.375g IV q8h (4 hour infusion).  Height: 6\' 3"  (190.5 cm) Weight: 163 lb (73.9 kg) IBW/kg (Calculated) : 84.5  Temp (24hrs), Avg:98.9 F (37.2 C), Min:98.9 F (37.2 C), Max:98.9 F (37.2 C)  Recent Labs  Lab 04/28/18 1917 04/28/18 1925  WBC 3.1*  --   CREATININE 1.03  --   LATICACIDVEN  --  1.10    Estimated Creatinine Clearance: 82.7 mL/min (by C-G formula based on SCr of 1.03 mg/dL).    Allergies  Allergen Reactions  . Niacin Rash    Thank you for allowing pharmacy to be a part of this patient's care.  Wynona Neat, PharmD, BCPS  04/28/2018 11:12 PM

## 2018-04-28 NOTE — ED Triage Notes (Signed)
PT was tx for insect bite to R foot on Thursday and has been taking oral antibiotics since.  However, swelling is increasing and red streak is travelling up R leg.  Was told by UC today to come to ED for IV antibiotics.

## 2018-04-28 NOTE — H&P (Signed)
History and Physical    Ryan [name] YJE:[id number] DOB: April 29, 1961 DOA: 04/28/2018  PCP: Townsend Roger, MD  Patient coming from: Home.  Chief Complaint: Right foot swelling and redness.  HPI: Ryan [name] is a 57 y.o. male with history of chronic low back pain, benign prostatic hypertrophy status post surgery started experiencing swelling in the right foot on the dorsal aspect which patient initially thought was an insect bite.  Patient squeezed it following which it became bigger and started worsening with some subjective feeling of fever and chills.  At this point patient went to the ER at [facility] was given Keflex despite taking which patient symptoms worsen with red streaking moving proximally to his thigh area.  Patient went to urgent care center and since patient has worsening cellulitis despite oral antibiotics advised to come to the ER for admission.  ED Course: X-rays of the right foot does not show anything acute but on exam patient has erythematous streaking going from the foot up to the thigh.  There is a red raised area around the dorsal aspect of the right foot.  Patient has good movement of the foot.  The patient started on IV antibiotics after blood cultures obtained admitted for cellulitis.  Review of Systems: As per HPI, rest all negative.   Past Medical History:  Diagnosis Date  . Enlarged prostate   . OCD (obsessive compulsive disorder)   . Overactive bladder   . Sinusitis     Past Surgical History:  Procedure Laterality Date  . ADENOIDECTOMY    . PROSTATE SURGERY    . SINOSCOPY  [id number]  . TONSILLECTOMY       reports that he has quit smoking. His smoking use included cigarettes. He has never used smokeless tobacco. He reports that he drinks alcohol. He reports that he does not use drugs.  Allergies  Allergen Reactions  . Niacin Rash    Family History  Problem Relation Age of Onset  . Allergic rhinitis Mother   . Alzheimer's disease  Father   . Breast cancer Maternal Grandmother     Prior to Admission medications   Medication Sig Start Date End Date Taking? Authorizing Provider  acetaminophen (TYLENOL) 500 MG tablet Take 500-2,000 mg by mouth every 4 (four) hours as needed (pain).    Yes [provider]  azelastine (ASTELIN) 0.1 % nasal spray Use two sprays in each nostril twice daily as needed for nasal drainage. Patient taking differently: Place 2 sprays into both nostrils 2 (two) times daily as needed (nasal drainage).  02/19/18  Yes Padgett, Rae Halsted, MD  cephALEXin (KEFLEX) 500 MG capsule Take 500 mg by mouth every 6 (six) hours. 10 day course filled 04/26/18   Yes [provider]  Cyanocobalamin (VITAMIN B-12 PO) Take 1 drop by mouth daily.   Yes [provider]  fluvoxaMINE (LUVOX) 100 MG tablet Take 300 mg by mouth at bedtime.   Yes [provider]  hydrocortisone cream 1 % Apply 1 application topically 2 (two) times daily as needed for itching (rash).   Yes [provider]  loperamide (IMODIUM A-D) 2 MG tablet Take 1-2 mg by mouth daily as needed for diarrhea or loose stools.   Yes [provider]  loratadine (CLARITIN) 10 MG tablet Take 10 mg by mouth daily as needed for allergies.   Yes [provider]  mirabegron ER (MYRBETRIQ) 50 MG TB24 tablet Take 50 mg by mouth at bedtime.  Yes [provider]  Multiple Vitamin (MULTIVITAMIN WITH MINERALS) TABS tablet Take 1 tablet by mouth daily.   Yes [provider]  mupirocin ointment (BACTROBAN) 2 % Place 1 application into the nose 3 (three) times daily. Filled 04/26/18   Yes [provider]  Neomy-Bacit-Polymyx-Pramoxine (NEOSPORIN/BURN RELIEF) 1 % OINT Apply 1 application topically daily as needed (wound care).   Yes [provider]  Omega-3 Fatty Acids (FISH OIL PO) Take 1 capsule by mouth daily.   Yes [provider]  OVER THE COUNTER MEDICATION Place 1  drop into both eyes daily as needed (dry eyes). Similasin Complete Eye relief (homeopathic)   Yes [provider]  OVER THE COUNTER MEDICATION Take 1 tablet by mouth daily. OTC testosterone booster   Yes [provider]  oxyCODONE (OXY IR/ROXICODONE) 5 MG immediate release tablet Take 5 mg by mouth every 6 (six) hours as needed for severe pain.   Yes [provider]  Probiotic Product (PROBIOTIC PO) Take 1 tablet by mouth daily.    Yes [provider]  simethicone (MYLICON) 80 MG chewable tablet Chew 80 mg by mouth every 6 (six) hours as needed for flatulence.   Yes [provider]  sodium chloride (OCEAN) 0.65 % SOLN nasal spray Place 1 spray into both nostrils daily.   Yes [provider]  TURMERIC PO Take 1 capsule by mouth daily.   Yes [provider]  emtricitabine-tenofovir (TRUVADA) 200-300 MG tablet Take 1 tablet by mouth daily.    [provider]    Physical Exam: Vitals:   04/28/18 1858  BP: 117/78  Pulse: 84  Resp: 18  Temp: 98.9 F (37.2 C)  TempSrc: Oral  SpO2: 99%  Weight: 73.9 kg (163 lb)  Height: 6\' 3"  (1.905 m)      Constitutional: Moderately built and nourished. Vitals:   04/28/18 1858  BP: 117/78  Pulse: 84  Resp: 18  Temp: 98.9 F (37.2 C)  TempSrc: Oral  SpO2: 99%  Weight: 73.9 kg (163 lb)  Height: 6\' 3"  (1.905 m)   Eyes: Anicteric no pallor. ENMT: No discharge from the ears eyes nose or mouth. Neck: No mass felt.  No JVD appreciated. Respiratory: No rhonchi or crepitations. Cardiovascular: S1-S2 heard no murmurs appreciated. Abdomen: Soft nontender bowel sounds present. Musculoskeletal: Redness and swelling in the dorsal aspect of the right foot. Skin: Redness and swelling of the dorsal aspect of the right foot with streaking going up the leg to the thigh. Neurologic: Alert awake oriented to time place and person.  Moves all extremities. Psychiatric: Appears normal per normal  affect.   Labs on Admission: I have personally reviewed following labs and imaging studies  CBC: Recent Labs  Lab 04/28/18 1917  WBC 3.1*  NEUTROABS 1.6*  HGB 13.1  HCT 39.8  MCV 101.0*  PLT 941   Basic Metabolic Panel: Recent Labs  Lab 04/28/18 1917  NA 135  K 4.0  CL 99*  CO2 27  GLUCOSE 71  BUN 11  CREATININE 1.03  CALCIUM 8.6*   GFR: Estimated Creatinine Clearance: 82.7 mL/min (by C-G formula based on SCr of 1.03 mg/dL). Liver Function Tests: Recent Labs  Lab 04/28/18 1917  AST 26  ALT 16*  ALKPHOS 55  BILITOT 0.5  PROT 6.7  ALBUMIN 3.7   No results for input(s): LIPASE, AMYLASE in the last 168 hours. No results for input(s): AMMONIA in the last 168 hours. Coagulation Profile: No results for input(s): INR, PROTIME  in the last 168 hours. Cardiac Enzymes: No results for input(s): CKTOTAL, CKMB, CKMBINDEX, TROPONINI in the last 168 hours. BNP (last 3 results) No results for input(s): PROBNP in the last 8760 hours. HbA1C: No results for input(s): HGBA1C in the last 72 hours. CBG: No results for input(s): GLUCAP in the last 168 hours. Lipid Profile: No results for input(s): CHOL, HDL, LDLCALC, TRIG, CHOLHDL, LDLDIRECT in the last 72 hours. Thyroid Function Tests: No results for input(s): TSH, T4TOTAL, FREET4, T3FREE, THYROIDAB in the last 72 hours. Anemia Panel: No results for input(s): VITAMINB12, FOLATE, FERRITIN, TIBC, IRON, RETICCTPCT in the last 72 hours. Urine analysis:    Component Value Date/Time   COLORURINE STRAW (A) 04/28/2018 1912   APPEARANCEUR CLEAR 04/28/2018 1912   LABSPEC 1.006 04/28/2018 1912   PHURINE 5.0 04/28/2018 1912   GLUCOSEU NEGATIVE 04/28/2018 1912   HGBUR MODERATE (A) 04/28/2018 1912   BILIRUBINUR NEGATIVE 04/28/2018 1912   KETONESUR NEGATIVE 04/28/2018 1912   PROTEINUR NEGATIVE 04/28/2018 1912   UROBILINOGEN 0.2 06/12/2008 0735   NITRITE NEGATIVE 04/28/2018 1912   LEUKOCYTESUR NEGATIVE 04/28/2018 1912   Sepsis  Labs: @LABRCNTIP (procalcitonin:4,lacticidven:4) )No results found for this or any previous visit (from the past 240 hour(s)).   Radiological Exams on Admission: Dg Ankle Complete Right  Result Date: 04/28/2018 CLINICAL DATA:  Possible insect bite yesterday. Patient looks to have an infection on the dorsal side of right foot. Streaking going up right leg. Tender to the touch. EXAM: RIGHT ANKLE - COMPLETE 3+ VIEW COMPARISON:  None. FINDINGS: Ankle mortise intact. The talar dome is normal. No malleolar fracture. The calcaneus is normal. IMPRESSION: No acute osseous findings.  No obvious soft tissue abnormality. Electronically Signed   By: Suzy Bouchard M.D.   On: 04/28/2018 19:46     Assessment/Plan Principal Problem:   Cellulitis of right leg Active Problems:   Cellulitis    1. Right foot cellulitis failed outpatient oral antibiotics placed patient on vancomycin and Zosyn follow cultures.  I have ordered MRI of the right foot. 2. History of chronic back pain continue home medications for pain. 3. Macrocytosis -no anemia.  Follow CBC further work-up as outpatient. 4. History of BPH status post surgery.   DVT prophylaxis: Lovenox. Code Status: Full code. Family Communication: Discussed with patient. Disposition Plan: Home. Consults called: None. Admission status: Observation.   Rise Patience MD [facility] Pager [phone].  If 7PM-7AM, please contact night-coverage [email or web address] Password [facility]  04/28/2018, 11:09 PM

## 2018-04-28 NOTE — ED Notes (Signed)
Kak[place], MD at bedside 

## 2018-04-28 NOTE — ED Notes (Signed)
Pt has been taking keflex since Tuesday.

## 2018-04-28 NOTE — ED Provider Notes (Signed)
[place] EMERGENCY DEPARTMENT Provider Note   CSN: [id number] Arrival date & time: 04/28/18  1849     History   Chief Complaint Chief Complaint  Patient presents with  . Insect Bite    HPI Ryan [name] is a 57 y.o. male presenting for evaluation of foot infection.  Patient states he noticed a spot on the top of his foot on Tuesday.  He scratched it that night.  The following morning, there is some drainage from the top of it.  On Thursday, it became very red and swollen.  He was seen at the [facility] ER, started on Keflex.  He has been taking it as prescribed, for the past 2.5 days.  He reports worsening of symptoms, pain is worse, and redness has extended from the foot up past his knee.  He was seen at urgent care, instructed to come to the ER.  He reports feeling chills and nausea without vomiting.  He denies fevers, chest pain, shortness of breath, abdominal pain.  He is not immunocompromised.  No history of diabetes.  He denies numbness or tingling.    HPI  Past Medical History:  Diagnosis Date  . Enlarged prostate   . OCD (obsessive compulsive disorder)   . Overactive bladder   . Sinusitis     Patient Active Problem List   Diagnosis Date Noted  . Cellulitis of right leg 04/28/2018  . Cellulitis 04/28/2018    Past Surgical History:  Procedure Laterality Date  . ADENOIDECTOMY    . PROSTATE SURGERY    . SINOSCOPY  [id number]  . TONSILLECTOMY          Home Medications    Prior to Admission medications   Medication Sig Start Date End Date Taking? Authorizing Provider  acetaminophen (TYLENOL) 500 MG tablet Take 500-2,000 mg by mouth every 4 (four) hours as needed (pain).    Yes [provider]  azelastine (ASTELIN) 0.1 % nasal spray Use two sprays in each nostril twice daily as needed for nasal drainage. Patient taking differently: Place 2 sprays into both nostrils 2 (two) times daily as needed (nasal drainage).  02/19/18  Yes  Padgett, Rae Halsted, MD  cephALEXin (KEFLEX) 500 MG capsule Take 500 mg by mouth every 6 (six) hours. 10 day course filled 04/26/18   Yes [provider]  Cyanocobalamin (VITAMIN B-12 PO) Take 1 drop by mouth daily.   Yes [provider]  fluvoxaMINE (LUVOX) 100 MG tablet Take 300 mg by mouth at bedtime.   Yes [provider]  hydrocortisone cream 1 % Apply 1 application topically 2 (two) times daily as needed for itching (rash).   Yes [provider]  loperamide (IMODIUM A-D) 2 MG tablet Take 1-2 mg by mouth daily as needed for diarrhea or loose stools.   Yes [provider]  loratadine (CLARITIN) 10 MG tablet Take 10 mg by mouth daily as needed for allergies.   Yes [provider]  mirabegron ER (MYRBETRIQ) 50 MG TB24 tablet Take 50 mg by mouth at bedtime.    Yes [provider]  Multiple Vitamin (MULTIVITAMIN WITH MINERALS) TABS tablet Take 1 tablet by mouth daily.   Yes [provider]  mupirocin ointment (BACTROBAN) 2 % Place 1 application into the nose 3 (three) times daily. Filled 04/26/18   Yes [provider]  Neomy-Bacit-Polymyx-Pramoxine (NEOSPORIN/BURN RELIEF) 1 % OINT Apply 1 application topically daily as needed (wound care).   Yes [provider]  Omega-3  Fatty Acids (FISH OIL PO) Take 1 capsule by mouth daily.   Yes [provider]  OVER THE COUNTER MEDICATION Place 1 drop into both eyes daily as needed (dry eyes). Similasin Complete Eye relief (homeopathic)   Yes [provider]  OVER THE COUNTER MEDICATION Take 1 tablet by mouth daily. OTC testosterone booster   Yes [provider]  oxyCODONE (OXY IR/ROXICODONE) 5 MG immediate release tablet Take 5 mg by mouth every 6 (six) hours as needed for severe pain.   Yes [provider]  Probiotic Product (PROBIOTIC PO) Take 1 tablet by mouth daily.    Yes [provider]  simethicone (MYLICON) 80 MG  chewable tablet Chew 80 mg by mouth every 6 (six) hours as needed for flatulence.   Yes [provider]  sodium chloride (OCEAN) 0.65 % SOLN nasal spray Place 1 spray into both nostrils daily.   Yes [provider]  TURMERIC PO Take 1 capsule by mouth daily.   Yes [provider]  emtricitabine-tenofovir (TRUVADA) 200-300 MG tablet Take 1 tablet by mouth daily.    [provider]    Family History Family History  Problem Relation Age of Onset  . Allergic rhinitis Mother   . Alzheimer's disease Father   . Breast cancer Maternal Grandmother     Social History Social History   Tobacco Use  . Smoking status: Former Smoker    Types: Cigarettes  . Smokeless tobacco: Never Used  . Tobacco comment: Quit 2012  Substance Use Topics  . Alcohol use: Yes    Comment: occ  . Drug use: No     Allergies   Niacin   Review of Systems Review of Systems  Constitutional: Positive for chills.  Gastrointestinal: Positive for nausea.  Skin: Positive for color change.  All other systems reviewed and are negative.    Physical Exam Updated Vital Signs BP 117/78 (BP Location: Right Arm)   Pulse 84   Temp 98.9 F (37.2 C) (Oral)   Resp 18   Ht 6\' 3"  (1.905 m)   Wt 73.9 kg (163 lb)   SpO2 99%   BMI 20.37 kg/m   Physical Exam  Constitutional: He is oriented to person, place, and time. He appears well-developed and well-nourished. No distress.  Appears in no distress  HENT:  Head: Normocephalic and atraumatic.  Eyes: Pupils are equal, round, and reactive to light. Conjunctivae and EOM are normal.  Neck: Normal range of motion. Neck supple.  Cardiovascular: Normal rate, regular rhythm and intact distal pulses.  Pulmonary/Chest: Effort normal and breath sounds normal. No respiratory distress. He has no wheezes.  Abdominal: Soft. Bowel sounds are normal. He exhibits no distension. There is no tenderness.  Musculoskeletal: Normal range of motion. He  exhibits edema and tenderness.  Neurological: He is alert and oriented to person, place, and time. No sensory deficit.  Skin: Skin is warm and dry. There is erythema.  Erythema and swelling of the dorsal aspect of the right foot with central darkening.  Streaking noted along the medial aspect of the right leg extending just proximal to the knee.  Leg is tender.  No swelling of the lower leg.  Pedal pulses intact bilaterally.  Good cap refill.  See pictures below.  Psychiatric: He has a normal mood and affect.  Nursing note and vitals reviewed.        ED Treatments / Results  Labs (all labs ordered are listed, but only abnormal results are displayed) Labs  Reviewed  COMPREHENSIVE METABOLIC PANEL - Abnormal; Notable for the following components:      Result Value   Chloride 99 (*)    Calcium 8.6 (*)    ALT 16 (*)    All other components within normal limits  CBC WITH DIFFERENTIAL/PLATELET - Abnormal; Notable for the following components:   WBC 3.1 (*)    RBC 3.94 (*)    MCV 101.0 (*)    Neutro Abs 1.6 (*)    All other components within normal limits  URINALYSIS, ROUTINE W REFLEX MICROSCOPIC - Abnormal; Notable for the following components:   Color, Urine STRAW (*)    Hgb urine dipstick MODERATE (*)    All other components within normal limits  CULTURE, BLOOD (ROUTINE X 2)  CULTURE, BLOOD (ROUTINE X 2)  HIV ANTIBODY (ROUTINE TESTING)  BASIC METABOLIC PANEL  CBC  I-STAT CG4 LACTIC ACID, ED    EKG None  Radiology Dg Ankle Complete Right  Result Date: 04/28/2018 CLINICAL DATA:  Possible insect bite yesterday. Patient looks to have an infection on the dorsal side of right foot. Streaking going up right leg. Tender to the touch. EXAM: RIGHT ANKLE - COMPLETE 3+ VIEW COMPARISON:  None. FINDINGS: Ankle mortise intact. The talar dome is normal. No malleolar fracture. The calcaneus is normal. IMPRESSION: No acute osseous findings.  No obvious soft tissue abnormality. Electronically  Signed   By: Suzy Bouchard M.D.   On: 04/28/2018 19:46    Procedures Procedures (including critical care time)  Medications Ordered in ED Medications  fluvoxaMINE (LUVOX) tablet 300 mg (300 mg Oral Given 04/29/18 0008)  mirabegron ER (MYRBETRIQ) tablet 50 mg (50 mg Oral Given 04/29/18 0009)  oxyCODONE (Oxy IR/ROXICODONE) immediate release tablet 5 mg (has no administration in time range)  acetaminophen (TYLENOL) tablet 650 mg (has no administration in time range)    Or  acetaminophen (TYLENOL) suppository 650 mg (has no administration in time range)  ondansetron (ZOFRAN) tablet 4 mg (has no administration in time range)    Or  ondansetron (ZOFRAN) injection 4 mg (has no administration in time range)  vancomycin (VANCOCIN) IVPB 1000 mg/200 mL premix (1,000 mg Intravenous New Bag/Given 04/29/18 0035)  vancomycin (VANCOCIN) IVPB 1000 mg/200 mL premix (has no administration in time range)  piperacillin-tazobactam (ZOSYN) IVPB 3.375 g (has no administration in time range)  clindamycin (CLEOCIN) IVPB 600 mg (0 mg Intravenous Stopped 04/28/18 2228)  piperacillin-tazobactam (ZOSYN) IVPB 3.375 g (0 g Intravenous Stopped 04/29/18 0030)     Initial Impression / Assessment and Plan / ED Course  I have reviewed the triage vital signs and the nursing notes.  Pertinent labs & imaging results that were available during my care of the patient were reviewed by me and considered in my medical decision making (see chart for details).  Clinical Course as of Apr 29 101  Sun Apr 28, 2018  2109 Patient with a superficial wound on the top of his foot with now and extension lymphangitic spread up into his groin.  He has been on a few days of Keflex and so he will likely need to be admitted to the hospital for IV antibiotics.   [MB]    Clinical Course User Index [MB] Hayden Rasmussen, MD    Patient presenting for evaluation of right foot redness, pain, and swelling.  Physical exam consistent with  cellulitis with streaking.  This has worsened on antibiotics.  Otherwise, patient appears nontoxic.  He is afebrile not tachycardic.  Labs reassuring,  no leukocytosis and lactic normal.  Doubt sepsis.  Ankle x-ray without signs of osteomyelitis.  Bedside ultrasound without abscess.  Will start clindamycin.  Case discussed with attending, Dr. Melina Copa evaluated the patient.  Will call for admission.  Discussed with Dr. Hal Hope from [facility], patient be admitted.  Requesting blood cultures, labs ordered.  Final Clinical Impressions(s) / ED Diagnoses   Final diagnoses:  Cellulitis of right lower extremity    ED Discharge Orders    None       Franchot Heidelberg, PA-C 04/29/18 0102    Hayden Rasmussen, MD 04/29/18 1148

## 2018-04-29 ENCOUNTER — Observation Stay (HOSPITAL_COMMUNITY): Payer: Medicare HMO

## 2018-04-29 ENCOUNTER — Other Ambulatory Visit: Payer: Self-pay

## 2018-04-29 ENCOUNTER — Encounter (HOSPITAL_COMMUNITY): Payer: Self-pay | Admitting: General Practice

## 2018-04-29 DIAGNOSIS — W57XXXA Bitten or stung by nonvenomous insect and other nonvenomous arthropods, initial encounter: Secondary | ICD-10-CM | POA: Diagnosis present

## 2018-04-29 DIAGNOSIS — Z888 Allergy status to other drugs, medicaments and biological substances status: Secondary | ICD-10-CM | POA: Diagnosis not present

## 2018-04-29 DIAGNOSIS — L03115 Cellulitis of right lower limb: Secondary | ICD-10-CM | POA: Diagnosis present

## 2018-04-29 DIAGNOSIS — N3281 Overactive bladder: Secondary | ICD-10-CM | POA: Diagnosis present

## 2018-04-29 DIAGNOSIS — Z79899 Other long term (current) drug therapy: Secondary | ICD-10-CM | POA: Diagnosis not present

## 2018-04-29 DIAGNOSIS — M545 Low back pain: Secondary | ICD-10-CM | POA: Diagnosis present

## 2018-04-29 DIAGNOSIS — N400 Enlarged prostate without lower urinary tract symptoms: Secondary | ICD-10-CM | POA: Diagnosis present

## 2018-04-29 DIAGNOSIS — F419 Anxiety disorder, unspecified: Secondary | ICD-10-CM | POA: Diagnosis present

## 2018-04-29 DIAGNOSIS — E440 Moderate protein-calorie malnutrition: Secondary | ICD-10-CM | POA: Diagnosis present

## 2018-04-29 DIAGNOSIS — D7589 Other specified diseases of blood and blood-forming organs: Secondary | ICD-10-CM | POA: Diagnosis present

## 2018-04-29 DIAGNOSIS — Z87891 Personal history of nicotine dependence: Secondary | ICD-10-CM | POA: Diagnosis not present

## 2018-04-29 DIAGNOSIS — L03116 Cellulitis of left lower limb: Secondary | ICD-10-CM | POA: Diagnosis not present

## 2018-04-29 DIAGNOSIS — Z6820 Body mass index (BMI) 20.0-20.9, adult: Secondary | ICD-10-CM | POA: Diagnosis not present

## 2018-04-29 DIAGNOSIS — G8929 Other chronic pain: Secondary | ICD-10-CM | POA: Diagnosis present

## 2018-04-29 DIAGNOSIS — F429 Obsessive-compulsive disorder, unspecified: Secondary | ICD-10-CM | POA: Diagnosis present

## 2018-04-29 LAB — BASIC METABOLIC PANEL
Anion gap: 8 (ref 5–15)
BUN: 11 mg/dL (ref 6–20)
CALCIUM: 8.7 mg/dL — AB (ref 8.9–10.3)
CO2: 29 mmol/L (ref 22–32)
CREATININE: 1.07 mg/dL (ref 0.61–1.24)
Chloride: 100 mmol/L — ABNORMAL LOW (ref 101–111)
GFR calc Af Amer: >60 mL/min (ref >60)
GFR calc non Af Amer: >60 mL/min (ref >60)
GLUCOSE: 149 mg/dL — AB (ref 65–99)
Potassium: 4.3 mmol/L (ref 3.5–5.1)
Sodium: 137 mmol/L (ref 135–145)

## 2018-04-29 LAB — CBC
HCT: 41.7 % (ref 39.0–52.0)
Hemoglobin: 14.1 g/dL (ref 13.0–17.0)
MCH: 33.5 pg (ref 26.0–34.0)
MCHC: 33.8 g/dL (ref 30.0–36.0)
MCV: 99 fL (ref 78.0–100.0)
PLATELETS: 166 10*3/uL (ref 150–400)
RBC: 4.21 MIL/uL — ABNORMAL LOW (ref 4.22–5.81)
RDW: 11.7 % (ref 11.5–15.5)
WBC: 3.5 10*3/uL — ABNORMAL LOW (ref 4.0–10.5)

## 2018-04-29 LAB — HIV ANTIBODY (ROUTINE TESTING W REFLEX): HIV Screen 4th Generation wRfx: NONREACTIVE

## 2018-04-29 NOTE — ED Notes (Signed)
Breakfast tray at bedside 

## 2018-04-29 NOTE — Progress Notes (Signed)
Progress Note    SALADIN PETRELLI  JME:[id number] DOB: Aug 09, 1961  DOA: 04/28/2018 PCP: Townsend Roger, MD    Brief Narrative:    Medical records reviewed and are as summarized below:  Ryan [name] is an 57 y.o. male with history of chronic low back pain, benign prostatic hypertrophy status post surgery started experiencing swelling in the right foot on the dorsal aspect which patient initially thought was an insect bite.  Patient squeezed it following which it became bigger and started worsening with some subjective feeling of fever and chills.  At this point patient went to the ER at [facility] was given Keflex despite taking which patient symptoms worsen with red streaking moving proximally to his thigh area.  Patient went to urgent care center and since patient has worsening cellulitis despite oral antibiotics advised to come to the ER for admission.  Upon further questioning, he only took 6 dose of PO keflex in 3-4 days.   Assessment/Plan:   Principal Problem:   Cellulitis of right leg Active Problems:   Cellulitis  Right foot cellulitis - failed outpatient oral antibiotics  -vancomycin and Zosyn - follow cultures. -MRI of the right foot pending -elevate extremity  History of chronic back pain continue home medications for pain.  Macrocytosis -no anemia.  Follow CBC further work-up as outpatient. -suspect alcohol related  History of BPH status post surgery.       Family Communication/Anticipated D/C date and plan/Code Status   DVT prophylaxis: Lovenox ordered. Code Status: Full Code.  Family Communication: none at bedside Disposition Plan:    Medical Consultants:    None.     Subjective:   Asking to for the cellulitis to be drained  Objective:    Vitals:   04/28/18 1858 04/29/18 0726 04/29/18 0815  BP: 117/78 110/64 104/66  Pulse: 84 85 86  Resp: 18 16 16   Temp: 98.9 F (37.2 C) 99.3 F (37.4 C) 97.6 F (36.4 C)  TempSrc: Oral  Oral Oral  SpO2: 99% 98% 98%  Weight: 73.9 kg (163 lb)  73.9 kg (163 lb)  Height: 6\' 3"  (1.905 m)  6\' 3"  (1.905 m)    Intake/Output Summary (Last 24 hours) at 04/29/2018 1258 Last data filed at 04/29/2018 0649 Gross per 24 hour  Intake 400 ml  Output 325 ml  Net 75 ml   Filed Weights   04/28/18 1858 04/29/18 0815  Weight: 73.9 kg (163 lb) 73.9 kg (163 lb)    Exam: Odd affect rrr Right foot with escar and red streaks to behind knee, some edema No further rashes  Data Reviewed:   I have personally reviewed following labs and imaging studies:  Labs: Labs show the following:   Basic Metabolic Panel: Recent Labs  Lab 04/28/18 1917 04/29/18 0501  NA 135 137  K 4.0 4.3  CL 99* 100*  CO2 27 29  GLUCOSE 71 149*  BUN 11 11  CREATININE 1.03 1.07  CALCIUM 8.6* 8.7*   GFR Estimated Creatinine Clearance: 79.6 mL/min (by C-G formula based on SCr of 1.07 mg/dL). Liver Function Tests: Recent Labs  Lab 04/28/18 1917  AST 26  ALT 16*  ALKPHOS 55  BILITOT 0.5  PROT 6.7  ALBUMIN 3.7   No results for input(s): LIPASE, AMYLASE in the last 168 hours. No results for input(s): AMMONIA in the last 168 hours. Coagulation profile No results for input(s): INR, PROTIME in the last 168 hours.  CBC: Recent Labs  Lab 04/28/18  1917 04/29/18 0501  WBC 3.1* 3.5*  NEUTROABS 1.6*  --   HGB 13.1 14.1  HCT 39.8 41.7  MCV 101.0* 99.0  PLT 150 166   Cardiac Enzymes: No results for input(s): CKTOTAL, CKMB, CKMBINDEX, TROPONINI in the last 168 hours. BNP (last 3 results) No results for input(s): PROBNP in the last 8760 hours. CBG: No results for input(s): GLUCAP in the last 168 hours. D-Dimer: No results for input(s): DDIMER in the last 72 hours. Hgb A1c: No results for input(s): HGBA1C in the last 72 hours. Lipid Profile: No results for input(s): CHOL, HDL, LDLCALC, TRIG, CHOLHDL, LDLDIRECT in the last 72 hours. Thyroid function studies: No results for input(s): TSH,  T4TOTAL, T3FREE, THYROIDAB in the last 72 hours.  Invalid input(s): FREET3 Anemia work up: No results for input(s): VITAMINB12, FOLATE, FERRITIN, TIBC, IRON, RETICCTPCT in the last 72 hours. Sepsis Labs: Recent Labs  Lab 04/28/18 1917 04/28/18 1925 04/29/18 0501  WBC 3.1*  --  3.5*  LATICACIDVEN  --  1.10  --     Microbiology No results found for this or any previous visit (from the past 240 hour(s)).  Procedures and diagnostic studies:  Dg Ankle Complete Right  Result Date: 04/28/2018 CLINICAL DATA:  Possible insect bite yesterday. Patient looks to have an infection on the dorsal side of right foot. Streaking going up right leg. Tender to the touch. EXAM: RIGHT ANKLE - COMPLETE 3+ VIEW COMPARISON:  None. FINDINGS: Ankle mortise intact. The talar dome is normal. No malleolar fracture. The calcaneus is normal. IMPRESSION: No acute osseous findings.  No obvious soft tissue abnormality. Electronically Signed   By: Suzy Bouchard M.D.   On: 04/28/2018 19:46    Medications:   . fluvoxaMINE  300 mg Oral QHS  . mirabegron ER  50 mg Oral QHS   Continuous Infusions: . piperacillin-tazobactam (ZOSYN)  IV Stopped (04/29/18 1048)  . vancomycin Stopped (04/29/18 1040)     LOS: 0 days   Geradine Girt  [facility]   *Please refer to [email or web address], password [email or web address] to get updated schedule on who will round on this patient, as hospitalists switch teams weekly. If 7PM-7AM, please contact night-coverage at [email or web address], password [email or web address] for any overnight needs.  04/29/2018, 12:58 PM

## 2018-04-29 NOTE — Progress Notes (Signed)
Ryan [name] is a 57 y.o. male patient admitted. Awake, alert - oriented  X 4 - no acute distress noted.  VSS - Blood pressure (Abnormal) 123/92, pulse 85, temperature 98.8 F (37.1 C), temperature source Oral, resp. rate 16, height 6\' 3"  (1.905 m), weight 73.9 kg (163 lb), SpO2 100 %.    IV in place, occlusive dsg intact without redness.  Orientation to room, and floor completed.  Admission INP armband ID verified with patient/family, and in place.   SR up x 2, fall assessment complete, with patient and family able to verbalize understanding of risk associated with falls, and verbalized understanding to call nsg before up out of bed.  Call light within reach, patient able to voice, and demonstrate understanding. No evidence of skin break down noted on exam.  Admission nurse notified of admission.     Will cont to eval and treat per MD orders.  Theora Master, RN 04/29/2018 2:45 PM

## 2018-04-30 LAB — CBC
HEMATOCRIT: 38.9 % — AB (ref 39.0–52.0)
HEMOGLOBIN: 13.2 g/dL (ref 13.0–17.0)
MCH: 33.2 pg (ref 26.0–34.0)
MCHC: 33.9 g/dL (ref 30.0–36.0)
MCV: 98 fL (ref 78.0–100.0)
Platelets: 145 10*3/uL — ABNORMAL LOW (ref 150–400)
RBC: 3.97 MIL/uL — AB (ref 4.22–5.81)
RDW: 11.7 % (ref 11.5–15.5)
WBC: 4 10*3/uL (ref 4.0–10.5)

## 2018-04-30 LAB — BASIC METABOLIC PANEL
ANION GAP: 7 (ref 5–15)
BUN: 11 mg/dL (ref 6–20)
CALCIUM: 8.6 mg/dL — AB (ref 8.9–10.3)
CHLORIDE: 100 mmol/L — AB (ref 101–111)
CO2: 29 mmol/L (ref 22–32)
CREATININE: 1.1 mg/dL (ref 0.61–1.24)
GFR calc non Af Amer: >60 mL/min (ref >60)
Glucose, Bld: 109 mg/dL — ABNORMAL HIGH (ref 65–99)
Potassium: 4.3 mmol/L (ref 3.5–5.1)
SODIUM: 136 mmol/L (ref 135–145)

## 2018-04-30 MED ORDER — SODIUM CHLORIDE 0.9 % IV SOLN: INTRAVENOUS | Status: DC

## 2018-04-30 MED ORDER — ADULT MULTIVITAMIN W/MINERALS CH
1.0000 | ORAL_TABLET | Freq: Every day | ORAL | Status: DC
  Administered 2018-04-30 – 2018-05-01 (×2): 1 via ORAL
  Filled 2018-04-30 (×2): qty 1

## 2018-04-30 MED ORDER — ENSURE ENLIVE PO LIQD
237.0000 mL | Freq: Two times a day (BID) | ORAL | Status: DC
  Administered 2018-04-30 – 2018-05-01 (×3): 237 mL via ORAL

## 2018-04-30 NOTE — Progress Notes (Signed)
Progress Note    JADRIEN [name]  TGG:[id number] DOB: 1961/07/08  DOA: 04/28/2018 PCP: Townsend Roger, MD    Brief Narrative:    Medical records reviewed and are as summarized below:  Ryan [name] is an 57 y.o. male with history of chronic low back pain, benign prostatic hypertrophy status post surgery started experiencing swelling in the right foot on the dorsal aspect which patient initially thought was an insect bite.  Patient squeezed it following which it became bigger and started worsening with some subjective feeling of fever and chills.  At this point patient went to the ER at [facility] was given Keflex despite taking which patient symptoms worsen with red streaking moving proximally to his thigh area.  Patient went to urgent care center and since patient has worsening cellulitis despite oral antibiotics advised to come to the ER for admission.  Upon further questioning, he only took 6 dose of PO keflex in 3-4 days.   Assessment/Plan:   Principal Problem:   Cellulitis of right leg Active Problems:   Cellulitis  Right foot cellulitis - failed outpatient oral antibiotics  -vancomycin and Zosyn- IV - blood cultures NGTD -MRI of the right foot- no sign of abscess -elevate extremity/heat pack  History of chronic back pain continue home medications for pain.  Macrocytosis -no anemia.  Follow CBC further work-up as outpatient. -suspect alcohol related -no sign of withdrawal  History of BPH status post surgery.       Family Communication/Anticipated D/C date and plan/Code Status   DVT prophylaxis: Lovenox ordered. Code Status: Full Code.  Family Communication: none at bedside Disposition Plan: home 24-48 hours   Medical Consultants:    None.     Subjective:   Still requiring pain medications of foot pain  Objective:    Vitals:   04/29/18 0815 04/29/18 1335 04/29/18 2107 04/30/18 0521  BP: 104/66 (!) 123/92 117/77 118/80  Pulse: 86 85 79  76  Resp: 16 16 16 16   Temp: 97.6 F (36.4 C) 98.8 F (37.1 C) 98.4 F (36.9 C) 98.6 F (37 C)  TempSrc: Oral Oral Oral Oral  SpO2: 98% 100% 100% 99%  Weight: 73.9 kg (163 lb)     Height: 6\' 3"  (1.905 m)       Intake/Output Summary (Last 24 hours) at 04/30/2018 1215 Last data filed at 04/30/2018 0600 Gross per 24 hour  Intake 1496.79 ml  Output 1550 ml  Net -53.21 ml   Filed Weights   04/28/18 1858 04/29/18 0815  Weight: 73.9 kg (163 lb) 73.9 kg (163 lb)    Exam: Odd affect rrr Right foot with escar and red streaks to behind knee, some edema No further rashes  Data Reviewed:   I have personally reviewed following labs and imaging studies:  Labs: Labs show the following:   Basic Metabolic Panel: Recent Labs  Lab 04/28/18 1917 04/29/18 0501 04/30/18 0557  NA 135 137 136  K 4.0 4.3 4.3  CL 99* 100* 100*  CO2 27 29 29   GLUCOSE 71 149* 109*  BUN 11 11 11   CREATININE 1.03 1.07 1.10  CALCIUM 8.6* 8.7* 8.6*   GFR Estimated Creatinine Clearance: 77.4 mL/min (by C-G formula based on SCr of 1.1 mg/dL). Liver Function Tests: Recent Labs  Lab 04/28/18 1917  AST 26  ALT 16*  ALKPHOS 55  BILITOT 0.5  PROT 6.7  ALBUMIN 3.7   No results for input(s): LIPASE, AMYLASE in the last 168 hours. No  results for input(s): AMMONIA in the last 168 hours. Coagulation profile No results for input(s): INR, PROTIME in the last 168 hours.  CBC: Recent Labs  Lab 04/28/18 1917 04/29/18 0501 04/30/18 0557  WBC 3.1* 3.5* 4.0  NEUTROABS 1.6*  --   --   HGB 13.1 14.1 13.2  HCT 39.8 41.7 38.9*  MCV 101.0* 99.0 98.0  PLT 150 166 145*   Cardiac Enzymes: No results for input(s): CKTOTAL, CKMB, CKMBINDEX, TROPONINI in the last 168 hours. BNP (last 3 results) No results for input(s): PROBNP in the last 8760 hours. CBG: No results for input(s): GLUCAP in the last 168 hours. D-Dimer: No results for input(s): DDIMER in the last 72 hours. Hgb A1c: No results for input(s):  HGBA1C in the last 72 hours. Lipid Profile: No results for input(s): CHOL, HDL, LDLCALC, TRIG, CHOLHDL, LDLDIRECT in the last 72 hours. Thyroid function studies: No results for input(s): TSH, T4TOTAL, T3FREE, THYROIDAB in the last 72 hours.  Invalid input(s): FREET3 Anemia work up: No results for input(s): VITAMINB12, FOLATE, FERRITIN, TIBC, IRON, RETICCTPCT in the last 72 hours. Sepsis Labs: Recent Labs  Lab 04/28/18 1917 04/28/18 1925 04/29/18 0501 04/30/18 0557  WBC 3.1*  --  3.5* 4.0  LATICACIDVEN  --  1.10  --   --     Microbiology Recent Results (from the past 240 hour(s))  Blood culture (routine x 2)     Status: None (Preliminary result)   Collection Time: 04/28/18 10:49 PM  Result Value Ref Range Status   Specimen Description BLOOD LEFT ANTECUBITAL  Final   Special Requests   Final    BOTTLES DRAWN AEROBIC AND ANAEROBIC Blood Culture adequate volume   Culture   Final    NO GROWTH < 24 HOURS Performed at [facility], [place] [address] [place], [place] [address]    Report Status PENDING  Incomplete  Blood culture (routine x 2)     Status: None (Preliminary result)   Collection Time: 04/28/18 10:55 PM  Result Value Ref Range Status   Specimen Description BLOOD RIGHT ANTECUBITAL  Final   Special Requests   Final    BOTTLES DRAWN AEROBIC AND ANAEROBIC Blood Culture adequate volume   Culture   Final    NO GROWTH < 24 HOURS Performed at [facility], [place] [address], [place], [place] [address]    Report Status PENDING  Incomplete    Procedures and diagnostic studies:  Dg Ankle Complete Right  Result Date: 04/28/2018 CLINICAL DATA:  Possible insect bite yesterday. Patient looks to have an infection on the dorsal side of right foot. Streaking going up right leg. Tender to the touch. EXAM: RIGHT ANKLE - COMPLETE 3+ VIEW COMPARISON:  None. FINDINGS: Ankle mortise intact. The talar dome is normal. No malleolar fracture. The calcaneus is normal. IMPRESSION: No  acute osseous findings.  No obvious soft tissue abnormality. Electronically Signed   By: Suzy Bouchard M.D.   On: 04/28/2018 19:46   Mr Foot Right Wo Contrast  Result Date: 04/29/2018 CLINICAL DATA:  Progressive cellulitis in the right lower extremity. Swelling of the right foot. EXAM: MRI OF THE RIGHT HINDFOOT WITHOUT CONTRAST TECHNIQUE: Multiplanar, multisequence MR imaging of the right hindfoot and midfoot was performed. No intravenous contrast was administered. COMPARISON:  Radiographs dated 04/28/2018 FINDINGS: Bones/Joint/Cartilage Normal.  No chondral defects or joint effusions. Ligaments The ligaments of the medial and lateral aspects the ankle normal. Muscles and Tendons The tendons around the ankle are normal  except for slight chronic degenerative changes of the distal Achilles tendon. Soft tissues There is slight subcutaneous edema on the dorsum of the foot at the anterior aspect of the ankle without a definable abscess. Tiny defect in the skin on image 11 of series 7 and image 36 of series 6. No definable abscess. IMPRESSION: 1. Nonspecific subcutaneous edema on the dorsum of the foot and anterior aspect of the ankle with a tiny skin lesion as described. 2. Otherwise, normal MRI of the right midfoot and hindfoot. Electronically Signed   By: Lorriane Shire M.D.   On: 04/29/2018 16:20    Medications:   . fluvoxaMINE  300 mg Oral QHS  . mirabegron ER  50 mg Oral QHS   Continuous Infusions: . piperacillin-tazobactam (ZOSYN)  IV Stopped (04/30/18 0939)  . vancomycin Stopped (04/30/18 1119)     LOS: 1 day   Geradine Girt  [facility]   *Please refer to [email or web address], password [email or web address] to get updated schedule on who will round on this patient, as hospitalists switch teams weekly. If 7PM-7AM, please contact night-coverage at [email or web address], password [email or web address] for any overnight needs.  04/30/2018, 12:15 PM

## 2018-04-30 NOTE — Progress Notes (Signed)
Initial Nutrition Assessment  DOCUMENTATION CODES:   Non-severe (moderate) malnutrition in context of social or environmental circumstances  INTERVENTION:   -Ensure Enlive po BID, each supplement provides 350 kcal and 20 grams of protein -MVI with minerals daily  NUTRITION DIAGNOSIS:   Moderate Malnutrition related to social / environmental circumstances as evidenced by mild fat depletion, moderate fat depletion, mild muscle depletion, moderate muscle depletion.  GOAL:   Patient will meet greater than or equal to 90% of their needs  MONITOR:   PO intake, Supplement acceptance, Labs, Weight trends, Skin, I & O's  REASON FOR ASSESSMENT:   Malnutrition Screening Tool    ASSESSMENT:   Ryan [name] is an 57 y.o. male with history of chronic low back pain, benign prostatic hypertrophy status post surgery started experiencing swelling in the right foot on the dorsal aspect which patient initially thought was an insect bite.   Pt admitted with rt foot cellulitis.  Spoke with pt at bedside, who reports he has always been slender, however, has become concerned about weight loss over the past month. Pt shares that his baseline weight is around 185#, however, this is not consistent with hx. Noted pt has experienced a 8.9% wt loss x 6 months, which while not significant for time frame, is concerning given decreased oral intake and presence of fat and muscle depletions.   Pt shares that this has been a difficult month for him; he has "been skipping meals and staying up late due to stress". Pt knows that he has been eating less, but was very tangential at time of visit, so difficult to obtain an accurate diet recall. He shares that he has had a similar experience in the past (significant wt loss due to stress), however, he was able to regain lost weight by consuming Ensure supplements prescribed by his PCP.   Pt reports his appetite has improved greatly since hospitalization. Pt consumed  100% of lunch meal (grilled chicken, sweet potato, and vegetables). He is very concerned about his weight loss and requests Ensure supplements. Discussed importance of good meal and supplement intake to promote healing  Labs reviewed.   NUTRITION - FOCUSED PHYSICAL EXAM:    Most Recent Value  Orbital Region  Moderate depletion  Upper Arm Region  Mild depletion  Thoracic and Lumbar Region  Mild depletion  Buccal Region  Moderate depletion  Temple Region  Moderate depletion  Clavicle Bone Region  Mild depletion  Clavicle and Acromion Bone Region  Moderate depletion  Scapular Bone Region  Mild depletion  Dorsal Hand  Moderate depletion  Patellar Region  Mild depletion  Anterior Thigh Region  Mild depletion  Posterior Calf Region  Mild depletion  Edema (RD Assessment)  None  Hair  Reviewed  Eyes  Reviewed  Mouth  Reviewed  Skin  Reviewed  Nails  Reviewed       Diet Order:   Diet Order           Diet regular Room service appropriate? Yes; Fluid consistency: Thin  Diet effective now          EDUCATION NEEDS:   Education needs have been addressed  Skin:  Skin Assessment: Reviewed RN Assessment  Last BM:  PTA  Height:   Ht Readings from Last 1 Encounters:  04/29/18 6\' 3"  (1.905 m)    Weight:   Wt Readings from Last 1 Encounters:  04/29/18 163 lb (73.9 kg)    Ideal Body Weight:  89.1 kg  BMI:  Body  mass index is 20.37 kg/m.  Estimated Nutritional Needs:   Kcal:  2200-2400  Protein:  110-125 grams  Fluid:  2.2-2.4 L    Ryan [name] A. Jimmye Norman, RD, LDN, CDE Pager: [phone] After hours Pager: [phone]

## 2018-05-01 DIAGNOSIS — E440 Moderate protein-calorie malnutrition: Secondary | ICD-10-CM

## 2018-05-01 MED ORDER — ENSURE ENLIVE PO LIQD: 237.0000 mL | Freq: Two times a day (BID) | ORAL | 12 refills | Status: DC

## 2018-05-01 MED ORDER — DOXYCYCLINE HYCLATE 100 MG PO TABS: 100.0000 mg | ORAL_TABLET | Freq: Two times a day (BID) | ORAL | 0 refills | Status: DC

## 2018-05-01 MED ORDER — DOXYCYCLINE HYCLATE 100 MG PO TABS: 100.0000 mg | ORAL_TABLET | Freq: Two times a day (BID) | ORAL | Status: DC

## 2018-05-01 NOTE — Discharge Summary (Addendum)
Physician Discharge Summary  [name]:[id number] DOB: 11-30-1960 DOA: 04/28/2018  PCP: Townsend Roger, MD  Admit date: 04/28/2018 Discharge date: 05/01/2018  Admitted From: home Discharge disposition: home   Recommendations for Outpatient Follow-Up:   1. Close follow up to ensure resolution of cellulitis   Discharge Diagnosis:   Principal Problem:   Cellulitis of right leg Active Problems:   Cellulitis   Malnutrition of moderate degree    Discharge Condition: Improved.  Diet recommendation: Low sodium, heart healthy  Wound care: elevated extremity.  Code status: Full.   History of Present Illness:   Ryan [name] is a 57 y.o. male with history of chronic low back pain, benign prostatic hypertrophy status post surgery started experiencing swelling in the right foot on the dorsal aspect which patient initially thought was an insect bite.  Patient squeezed it following which it became bigger and started worsening with some subjective feeling of fever and chills.  At this point patient went to the ER at [facility] was given Keflex despite taking which patient symptoms worsen with red streaking moving proximally to his thigh area.  Patient went to urgent care center and since patient has worsening cellulitis despite oral antibiotics advised to come to the ER for admission.     Hospital Course by Problem:   Right foot cellulitis - failed outpatient oral antibiotics (was not taking 4x a day...) -vancomycin and Zosyn- IV change to PO doxy - blood cultures NGTD -MRI of the right foot- no sign of abscess or drainable area -elevate extremity/heat pack -HIV neg  History of chronic back pain continue home medications for pain.  Macrocytosis-no anemia. Follow CBC further work-up as outpatient. -suspect alcohol related -no sign of withdrawal  History of BPH status post surgery.  Malnutrition of mod deg -supplements  Anxiety -defer to  PCP   Medical Consultants:      Discharge Exam:   Vitals:   05/01/18 0548 05/01/18 1305  BP: 120/85 118/77  Pulse: 66 79  Resp: 16   Temp: 97.8 F (36.6 C) 98.2 F (36.8 C)  SpO2: 100% 98%   Vitals:   04/30/18 1440 04/30/18 2105 05/01/18 0548 05/01/18 1305  BP: 98/64 110/72 120/85 118/77  Pulse: 83 85 66 79  Resp: 16 16 16    Temp: 98.7 F (37.1 C) 99.6 F (37.6 C) 97.8 F (36.6 C) 98.2 F (36.8 C)  TempSrc: Oral Oral Oral Oral  SpO2: 99% 100% 100% 98%  Weight:      Height:        General exam: Appears calm and comfortable. Odd affect   The results of significant diagnostics from this hospitalization (including imaging, microbiology, ancillary and laboratory) are listed below for reference.     Procedures and Diagnostic Studies:   Dg Ankle Complete Right  Result Date: 04/28/2018 CLINICAL DATA:  Possible insect bite yesterday. Patient looks to have an infection on the dorsal side of right foot. Streaking going up right leg. Tender to the touch. EXAM: RIGHT ANKLE - COMPLETE 3+ VIEW COMPARISON:  None. FINDINGS: Ankle mortise intact. The talar dome is normal. No malleolar fracture. The calcaneus is normal. IMPRESSION: No acute osseous findings.  No obvious soft tissue abnormality. Electronically Signed   By: Suzy Bouchard M.D.   On: 04/28/2018 19:46   Mr Foot Right Wo Contrast  Result Date: 04/29/2018 CLINICAL DATA:  Progressive cellulitis in the right lower extremity. Swelling of the right foot. EXAM: MRI OF THE  RIGHT HINDFOOT WITHOUT CONTRAST TECHNIQUE: Multiplanar, multisequence MR imaging of the right hindfoot and midfoot was performed. No intravenous contrast was administered. COMPARISON:  Radiographs dated 04/28/2018 FINDINGS: Bones/Joint/Cartilage Normal.  No chondral defects or joint effusions. Ligaments The ligaments of the medial and lateral aspects the ankle normal. Muscles and Tendons The tendons around the ankle are normal except for slight chronic  degenerative changes of the distal Achilles tendon. Soft tissues There is slight subcutaneous edema on the dorsum of the foot at the anterior aspect of the ankle without a definable abscess. Tiny defect in the skin on image 11 of series 7 and image 36 of series 6. No definable abscess. IMPRESSION: 1. Nonspecific subcutaneous edema on the dorsum of the foot and anterior aspect of the ankle with a tiny skin lesion as described. 2. Otherwise, normal MRI of the right midfoot and hindfoot. Electronically Signed   By: Lorriane Shire M.D.   On: 04/29/2018 16:20     Labs:   Basic Metabolic Panel: Recent Labs  Lab 04/28/18 1917 04/29/18 0501 04/30/18 0557  NA 135 137 136  K 4.0 4.3 4.3  CL 99* 100* 100*  CO2 27 29 29   GLUCOSE 71 149* 109*  BUN 11 11 11   CREATININE 1.03 1.07 1.10  CALCIUM 8.6* 8.7* 8.6*   GFR Estimated Creatinine Clearance: 77.4 mL/min (by C-G formula based on SCr of 1.1 mg/dL). Liver Function Tests: Recent Labs  Lab 04/28/18 1917  AST 26  ALT 16*  ALKPHOS 55  BILITOT 0.5  PROT 6.7  ALBUMIN 3.7   No results for input(s): LIPASE, AMYLASE in the last 168 hours. No results for input(s): AMMONIA in the last 168 hours. Coagulation profile No results for input(s): INR, PROTIME in the last 168 hours.  CBC: Recent Labs  Lab 04/28/18 1917 04/29/18 0501 04/30/18 0557  WBC 3.1* 3.5* 4.0  NEUTROABS 1.6*  --   --   HGB 13.1 14.1 13.2  HCT 39.8 41.7 38.9*  MCV 101.0* 99.0 98.0  PLT 150 166 145*   Cardiac Enzymes: No results for input(s): CKTOTAL, CKMB, CKMBINDEX, TROPONINI in the last 168 hours. BNP: Invalid input(s): POCBNP CBG: No results for input(s): GLUCAP in the last 168 hours. D-Dimer No results for input(s): DDIMER in the last 72 hours. Hgb A1c No results for input(s): HGBA1C in the last 72 hours. Lipid Profile No results for input(s): CHOL, HDL, LDLCALC, TRIG, CHOLHDL, LDLDIRECT in the last 72 hours. Thyroid function studies No results for input(s):  TSH, T4TOTAL, T3FREE, THYROIDAB in the last 72 hours.  Invalid input(s): FREET3 Anemia work up No results for input(s): VITAMINB12, FOLATE, FERRITIN, TIBC, IRON, RETICCTPCT in the last 72 hours. Microbiology Recent Results (from the past 240 hour(s))  Blood culture (routine x 2)     Status: None (Preliminary result)   Collection Time: 04/28/18 10:49 PM  Result Value Ref Range Status   Specimen Description BLOOD LEFT ANTECUBITAL  Final   Special Requests   Final    BOTTLES DRAWN AEROBIC AND ANAEROBIC Blood Culture adequate volume   Culture NO GROWTH 3 DAYS  Final   Report Status PENDING  Incomplete  Blood culture (routine x 2)     Status: None (Preliminary result)   Collection Time: 04/28/18 10:55 PM  Result Value Ref Range Status   Specimen Description BLOOD RIGHT ANTECUBITAL  Final   Special Requests   Final    BOTTLES DRAWN AEROBIC AND ANAEROBIC Blood Culture adequate volume   Culture NO GROWTH 3  DAYS  Final   Report Status PENDING  Incomplete     Discharge Instructions:   Discharge Instructions    Diet - low sodium heart healthy   Complete by:  As directed    Diet general   Complete by:  As directed    Discharge instructions   Complete by:  As directed    Close follow up with PCP-- keep foot elevated-- can use warm compresses   Increase activity slowly   Complete by:  As directed    Increase activity slowly   Complete by:  As directed      Allergies as of 05/01/2018      Reactions   Niacin Rash      Medication List    STOP taking these medications   cephALEXin 500 MG capsule Commonly known as:  KEFLEX     TAKE these medications   acetaminophen 500 MG tablet Commonly known as:  TYLENOL Take 500-2,000 mg by mouth every 4 (four) hours as needed (pain).   azelastine 0.1 % nasal spray Commonly known as:  ASTELIN Use two sprays in each nostril twice daily as needed for nasal drainage. What changed:    how much to take  how to take this  when to take  this  reasons to take this  additional instructions   doxycycline 100 MG tablet Commonly known as:  VIBRA-TABS Take 1 tablet (100 mg total) by mouth every 12 (twelve) hours.   emtricitabine-tenofovir 200-300 MG tablet Commonly known as:  TRUVADA Take 1 tablet by mouth daily.   feeding supplement (ENSURE ENLIVE) Liqd Take 237 mLs by mouth 2 (two) times daily between meals.   FISH OIL PO Take 1 capsule by mouth daily.   fluvoxaMINE 100 MG tablet Commonly known as:  LUVOX Take 300 mg by mouth at bedtime.   hydrocortisone cream 1 % Apply 1 application topically 2 (two) times daily as needed for itching (rash).   loperamide 2 MG tablet Commonly known as:  IMODIUM A-D Take 1-2 mg by mouth daily as needed for diarrhea or loose stools.   loratadine 10 MG tablet Commonly known as:  CLARITIN Take 10 mg by mouth daily as needed for allergies.   multivitamin with minerals Tabs tablet Take 1 tablet by mouth daily.   mupirocin ointment 2 % Commonly known as:  BACTROBAN Place 1 application into the nose 3 (three) times daily. Filled 04/26/18   MYRBETRIQ 50 MG Tb24 tablet Generic drug:  mirabegron ER Take 50 mg by mouth at bedtime.   NEOSPORIN/BURN RELIEF 1 % Oint Generic drug:  Neomy-Bacit-Polymyx-Pramoxine Apply 1 application topically daily as needed (wound care).   OVER THE COUNTER MEDICATION Place 1 drop into both eyes daily as needed (dry eyes). Similasin Complete Eye relief (homeopathic)   OVER THE COUNTER MEDICATION Take 1 tablet by mouth daily. OTC testosterone booster   oxyCODONE 5 MG immediate release tablet Commonly known as:  Oxy IR/ROXICODONE Take 5 mg by mouth every 6 (six) hours as needed for severe pain.   PROBIOTIC PO Take 1 tablet by mouth daily.   simethicone 80 MG chewable tablet Commonly known as:  MYLICON Chew 80 mg by mouth every 6 (six) hours as needed for flatulence.   sodium chloride 0.65 % Soln nasal spray Commonly known as:  OCEAN Place  1 spray into both nostrils daily.   TURMERIC PO Take 1 capsule by mouth daily.   VITAMIN B-12 PO Take 1 drop by mouth daily.  Follow-up Information    Townsend Roger, MD Follow up in 1 week(s).   Specialty:  Internal Medicine Why:  for cellulitis follow up Contact information: [place] [id number] [phone]            Time coordinating discharge: 35 min  Signed:  Geradine Girt  [facility] 05/01/2018, 3:39 PM

## 2018-05-01 NOTE — Discharge Instructions (Signed)

## 2018-05-01 NOTE — Progress Notes (Signed)
Discharge instructions reviewed with Pt and his mother.  Pt to pick up his prescriptions at his pharmacy Pt left via w/co with staff, to go home with his mother

## 2018-05-03 LAB — CULTURE, BLOOD (ROUTINE X 2)
Culture: NO GROWTH
Culture: NO GROWTH
Special Requests: ADEQUATE
Special Requests: ADEQUATE

## 2018-05-13 DIAGNOSIS — L0390 Cellulitis, unspecified: Secondary | ICD-10-CM | POA: Diagnosis not present

## 2018-05-14 DIAGNOSIS — Z0100 Encounter for examination of eyes and vision without abnormal findings: Secondary | ICD-10-CM | POA: Diagnosis not present

## 2018-05-24 ENCOUNTER — Ambulatory Visit (INDEPENDENT_AMBULATORY_CARE_PROVIDER_SITE_OTHER): Payer: Medicare HMO | Admitting: Sports Medicine

## 2018-05-24 ENCOUNTER — Encounter: Payer: Self-pay | Admitting: Sports Medicine

## 2018-05-24 DIAGNOSIS — W57XXXA Bitten or stung by nonvenomous insect and other nonvenomous arthropods, initial encounter: Secondary | ICD-10-CM | POA: Diagnosis not present

## 2018-05-24 DIAGNOSIS — S90861A Insect bite (nonvenomous), right foot, initial encounter: Secondary | ICD-10-CM

## 2018-05-24 DIAGNOSIS — M79671 Pain in right foot: Secondary | ICD-10-CM | POA: Diagnosis not present

## 2018-05-24 MED ORDER — DOXYCYCLINE HYCLATE 100 MG PO TABS: 100.0000 mg | ORAL_TABLET | Freq: Two times a day (BID) | ORAL | 0 refills | Status: DC

## 2018-05-24 NOTE — Progress Notes (Signed)
Subjective: Ryan [name] is a 57 y.o. male patient seen in office for evaluation of wound to the right foot. Patient has a history of possible insect bite and was admitted to [facility] for cellulitis to his right foot on 04/28/2018.  Patient reports that since being discharged from hospital and completing doxycycline antibiotics he noticed that a small raised area of redness was starting around the wound bed and became concerned.  Patient denies any pain to the area but is concerned about the redness and swelling states that he has been covering the area with a Band-Aid. Denies nausea/fever/vomiting/chills/night sweats/shortness of breath/pain. Patient has no other pedal complaints at this time.  Review of Systems  Skin:       Wound right foot  All other systems reviewed and are negative.   Patient Active Problem List   Diagnosis Date Noted  . Malnutrition of moderate degree 05/01/2018  . Cellulitis of right leg 04/28/2018  . Cellulitis 04/28/2018   Current Outpatient Medications on File Prior to Visit  Medication Sig Dispense Refill  . acetaminophen (TYLENOL) 500 MG tablet Take 500-2,000 mg by mouth every 4 (four) hours as needed (pain).     Marland Kitchen azelastine (ASTELIN) 0.1 % nasal spray Use two sprays in each nostril twice daily as needed for nasal drainage. (Patient taking differently: Place 2 sprays into both nostrils 2 (two) times daily as needed (nasal drainage). ) 30 mL 5  . Cyanocobalamin (VITAMIN B-12 PO) Take 1 drop by mouth daily.    Marland Kitchen emtricitabine-tenofovir (TRUVADA) 200-300 MG tablet Take 1 tablet by mouth daily.    . feeding supplement, ENSURE ENLIVE, (ENSURE ENLIVE) LIQD Take 237 mLs by mouth 2 (two) times daily between meals. 237 mL 12  . fluvoxaMINE (LUVOX) 100 MG tablet Take 300 mg by mouth at bedtime.    . hydrocortisone cream 1 % Apply 1 application topically 2 (two) times daily as needed for itching (rash).    Marland Kitchen loperamide (IMODIUM A-D) 2 MG tablet Take 1-2 mg by mouth daily  as needed for diarrhea or loose stools.    Marland Kitchen loratadine (CLARITIN) 10 MG tablet Take 10 mg by mouth daily as needed for allergies.    . mirabegron ER (MYRBETRIQ) 50 MG TB24 tablet Take 50 mg by mouth at bedtime.     . Multiple Vitamin (MULTIVITAMIN WITH MINERALS) TABS tablet Take 1 tablet by mouth daily.    . mupirocin ointment (BACTROBAN) 2 % Place 1 application into the nose 3 (three) times daily. Filled 04/26/18    . Neomy-Bacit-Polymyx-Pramoxine (NEOSPORIN/BURN RELIEF) 1 % OINT Apply 1 application topically daily as needed (wound care).    . Omega-3 Fatty Acids (FISH OIL PO) Take 1 capsule by mouth daily.    Marland Kitchen OVER THE COUNTER MEDICATION Place 1 drop into both eyes daily as needed (dry eyes). Similasin Complete Eye relief (homeopathic)    . OVER THE COUNTER MEDICATION Take 1 tablet by mouth daily. OTC testosterone booster    . oxyCODONE (OXY IR/ROXICODONE) 5 MG immediate release tablet Take 5 mg by mouth every 6 (six) hours as needed for severe pain.    . Probiotic Product (PROBIOTIC PO) Take 1 tablet by mouth daily.     . simethicone (MYLICON) 80 MG chewable tablet Chew 80 mg by mouth every 6 (six) hours as needed for flatulence.    . sodium chloride (OCEAN) 0.65 % SOLN nasal spray Place 1 spray into both nostrils daily.    . TURMERIC PO Take 1  capsule by mouth daily.     No current facility-administered medications on file prior to visit.    Allergies  Allergen Reactions  . Niacin Rash    Recent Results (from the past 2160 hour(s))  Urinalysis, Routine w reflex microscopic     Status: Abnormal   Collection Time: 04/28/18  7:12 PM  Result Value Ref Range   Color, Urine STRAW (A) YELLOW   APPearance CLEAR CLEAR   Specific Gravity, Urine 1.006 1.005 - 1.030   pH 5.0 5.0 - 8.0   Glucose, UA NEGATIVE NEGATIVE mg/dL   Hgb urine dipstick MODERATE (A) NEGATIVE   Bilirubin Urine NEGATIVE NEGATIVE   Ketones, ur NEGATIVE NEGATIVE mg/dL   Protein, ur NEGATIVE NEGATIVE mg/dL   Nitrite  NEGATIVE NEGATIVE   Leukocytes, UA NEGATIVE NEGATIVE   RBC / HPF 0-5 0 - 5 RBC/hpf   WBC, UA 0-5 0 - 5 WBC/hpf   Bacteria, UA NONE SEEN NONE SEEN    Comment: Performed at [facility], [address] [place], [place] [address]  Comprehensive metabolic panel     Status: Abnormal   Collection Time: 04/28/18  7:17 PM  Result Value Ref Range   Sodium 135 135 - 145 mmol/L   Potassium 4.0 3.5 - 5.1 mmol/L   Chloride 99 (L) 101 - 111 mmol/L   CO2 27 22 - 32 mmol/L   Glucose, Bld 71 65 - 99 mg/dL   BUN 11 6 - 20 mg/dL   Creatinine, Ser 1.03 0.61 - 1.24 mg/dL   Calcium 8.6 (L) 8.9 - 10.3 mg/dL   Total Protein 6.7 6.5 - 8.1 g/dL   Albumin 3.7 3.5 - 5.0 g/dL   AST 26 15 - 41 U/L   ALT 16 (L) 17 - 63 U/L   Alkaline Phosphatase 55 38 - 126 U/L   Total Bilirubin 0.5 0.3 - 1.2 mg/dL   GFR calc non Af Amer >60 >60 mL/min   GFR calc Af Amer >60 >60 mL/min    Comment: (NOTE) The eGFR has been calculated using the CKD EPI equation. This calculation has not been validated in all clinical situations. eGFR's persistently <60 mL/min signify possible Chronic Kidney Disease.    Anion gap 9 5 - 15    Comment: Performed at [facility] [address], [place], [place] [address]  CBC with Differential     Status: Abnormal   Collection Time: 04/28/18  7:17 PM  Result Value Ref Range   WBC 3.1 (L) 4.0 - 10.5 K/uL   RBC 3.94 (L) 4.22 - 5.81 MIL/uL   Hemoglobin 13.1 13.0 - 17.0 g/dL   HCT 39.8 39.0 - 52.0 %   MCV 101.0 (H) 78.0 - 100.0 fL   MCH 33.2 26.0 - 34.0 pg   MCHC 32.9 30.0 - 36.0 g/dL   RDW 11.8 11.5 - 15.5 %   Platelets 150 150 - 400 K/uL   Neutrophils Relative % 54 %   Neutro Abs 1.6 (L) 1.7 - 7.7 K/uL   Lymphocytes Relative 28 %   Lymphs Abs 0.9 0.7 - 4.0 K/uL   Monocytes Relative 17 %   Monocytes Absolute 0.5 0.1 - 1.0 K/uL   Eosinophils Relative 0 %   Eosinophils Absolute 0.0 0.0 - 0.7 K/uL   Basophils Relative 1 %   Basophils Absolute 0.0 0.0 - 0.1 K/uL   Immature  Granulocytes 0 %   Abs Immature Granulocytes 0.0 0.0 - 0.1 K/uL    Comment:  Performed at [facility], [place] [address], [place], [place] [address]  I-Stat CG4 Lactic Acid, ED     Status: None   Collection Time: 04/28/18  7:25 PM  Result Value Ref Range   Lactic Acid, Venous 1.10 0.5 - 1.9 mmol/L  Blood culture (routine x 2)     Status: None   Collection Time: 04/28/18 10:49 PM  Result Value Ref Range   Specimen Description BLOOD LEFT ANTECUBITAL    Special Requests      BOTTLES DRAWN AEROBIC AND ANAEROBIC Blood Culture adequate volume   Culture      NO GROWTH 5 DAYS Performed at [facility], [place] [address], [place], [place] [address]    Report Status 05/03/2018 FINAL   Blood culture (routine x 2)     Status: None   Collection Time: 04/28/18 10:55 PM  Result Value Ref Range   Specimen Description BLOOD RIGHT ANTECUBITAL    Special Requests      BOTTLES DRAWN AEROBIC AND ANAEROBIC Blood Culture adequate volume   Culture      NO GROWTH 5 DAYS Performed at [facility], [place] [address], [place], [place] [address]    Report Status 05/03/2018 FINAL   HIV antibody (Routine Testing)     Status: None   Collection Time: 04/29/18  5:01 AM  Result Value Ref Range   HIV Screen 4th Generation wRfx Non Reactive Non Reactive    Comment: (NOTE) Performed At: [facility] [address] [place], [place] [id number] Rush Farmer MD [id number] Performed at [facility], [place] [address], [place], [place] [address]   Basic metabolic panel     Status: Abnormal   Collection Time: 04/29/18  5:01 AM  Result Value Ref Range   Sodium 137 135 - 145 mmol/L   Potassium 4.3 3.5 - 5.1 mmol/L   Chloride 100 (L) 101 - 111 mmol/L   CO2 29 22 - 32 mmol/L   Glucose, Bld 149 (H) 65 - 99 mg/dL   BUN 11 6 - 20 mg/dL   Creatinine, Ser 1.07 0.61 - 1.24 mg/dL   Calcium 8.7 (L) 8.9 - 10.3 mg/dL   GFR calc non Af Amer >60 >60 mL/min   GFR calc Af Amer >60 >60 mL/min     Comment: (NOTE) The eGFR has been calculated using the CKD EPI equation. This calculation has not been validated in all clinical situations. eGFR's persistently <60 mL/min signify possible Chronic Kidney Disease.    Anion gap 8 5 - 15    Comment: Performed at [facility] [address] [place], [place] [address]  CBC     Status: Abnormal   Collection Time: 04/29/18  5:01 AM  Result Value Ref Range   WBC 3.5 (L) 4.0 - 10.5 K/uL   RBC 4.21 (L) 4.22 - 5.81 MIL/uL   Hemoglobin 14.1 13.0 - 17.0 g/dL   HCT 41.7 39.0 - 52.0 %   MCV 99.0 78.0 - 100.0 fL   MCH 33.5 26.0 - 34.0 pg   MCHC 33.8 30.0 - 36.0 g/dL   RDW 11.7 11.5 - 15.5 %   Platelets 166 150 - 400 K/uL    Comment: Performed at [facility], [place] [address] [place], [place] [address]  CBC     Status: Abnormal   Collection Time: 04/30/18  5:57 AM  Result Value Ref Range   WBC 4.0 4.0 - 10.5 K/uL   RBC 3.97 (  L) 4.22 - 5.81 MIL/uL   Hemoglobin 13.2 13.0 - 17.0 g/dL   HCT 38.9 (L) 39.0 - 52.0 %   MCV 98.0 78.0 - 100.0 fL   MCH 33.2 26.0 - 34.0 pg   MCHC 33.9 30.0 - 36.0 g/dL   RDW 11.7 11.5 - 15.5 %   Platelets 145 (L) 150 - 400 K/uL    Comment: Performed at [facility] [address] [place], [place] [address]  Basic metabolic panel     Status: Abnormal   Collection Time: 04/30/18  5:57 AM  Result Value Ref Range   Sodium 136 135 - 145 mmol/L   Potassium 4.3 3.5 - 5.1 mmol/L   Chloride 100 (L) 101 - 111 mmol/L   CO2 29 22 - 32 mmol/L   Glucose, Bld 109 (H) 65 - 99 mg/dL   BUN 11 6 - 20 mg/dL   Creatinine, Ser 1.10 0.61 - 1.24 mg/dL   Calcium 8.6 (L) 8.9 - 10.3 mg/dL   GFR calc non Af Amer >60 >60 mL/min   GFR calc Af Amer >60 >60 mL/min    Comment: (NOTE) The eGFR has been calculated using the CKD EPI equation. This calculation has not been validated in all clinical situations. eGFR's persistently <60 mL/min signify possible Chronic Kidney Disease.    Anion gap 7 5 - 15    Comment: Performed  at [facility] [address], [place], [place] [address]    Objective: There were no vitals filed for this visit.  General: Patient is awake, alert, oriented x 3 and in no acute distress.  Dermatology: Skin is warm and dry bilateral with a partial thickness ulceration present  Dorsal surface of the right foot. Ulceration measures 0.5 cm x 0.4 cm x 0.1cm. There is a  Raised rolled red border with a granular base. The ulceration does not  probe to bone. There is no malodor, no active drainage, focal erythema, no edema. No other acute signs of infection.     Vascular: Dorsalis Pedis pulse = 2/4 Bilateral,  Posterior Tibial pulse = 1/4 Bilateral,  Capillary Fill Time < 5 seconds.  Neurologic: Gross pedal sensation intact bilateral.  Musculosketal: There is mild pain with palpation to ulcerated area. No pain with compression to calves bilateral.   No results for input(s): GRAMSTAIN, LABORGA in the last 8760 hours.  Assessment and Plan:  Problem List Items Addressed This Visit    None    Visit Diagnoses    Insect bite of right foot, initial encounter    -  Primary   Possible    Relevant Medications   doxycycline (VIBRA-TABS) 100 MG tablet   Right foot pain          -Examined patient and discussed the progression of the wound possibly secondary to insect bite and treatment alternatives. -Xrays and MRI from 04/29/2018 reviewed - Excisionally dedbrided ulceration at right dorsal foot to healthy bleeding borders removing nonviable tissue using a steriletissue nipper. Wound measures post debridement as above.  Wound was debrided to the level of the dermis with viable wound base exposed to promote healing. Hemostasis was achieved with manuel pressure. Patient tolerated procedure well without any discomfort or anesthesia necessary for this wound debridement.  -Applied triple antibiotic cream and Band-Aid and instructed patient to continue with daily dressings at home consisting of  the same.  Advised patient to refrain from using peroxide to the wound. -Advised patient to go to the ER or  return to office if the wound worsens or if constitutional symptoms are present. -Refilled doxycycline and advised patient to take Benadryl if he noticed the margins of the wound becoming intensely red or if there is any itching or increased pain to the area and if not relieved go to ER. -Patient to return to office in 2 weeks for follow up care and evaluation or sooner if problems arise.  Landis Martins, DPM

## 2018-05-24 NOTE — Progress Notes (Signed)
   Subjective:    Patient ID: Ryan [name], male    DOB: Sep 12, 1961, 57 y.o.   MRN: [id number]  HPI    Review of Systems  All other systems reviewed and are negative.      Objective:   Physical Exam        Assessment & Plan:

## 2018-06-05 DIAGNOSIS — G894 Chronic pain syndrome: Secondary | ICD-10-CM | POA: Diagnosis not present

## 2018-06-07 ENCOUNTER — Encounter: Payer: Self-pay | Admitting: Sports Medicine

## 2018-06-07 ENCOUNTER — Ambulatory Visit (INDEPENDENT_AMBULATORY_CARE_PROVIDER_SITE_OTHER): Payer: Medicare HMO | Admitting: Sports Medicine

## 2018-06-07 DIAGNOSIS — S90861A Insect bite (nonvenomous), right foot, initial encounter: Secondary | ICD-10-CM

## 2018-06-07 DIAGNOSIS — M79671 Pain in right foot: Secondary | ICD-10-CM

## 2018-06-07 DIAGNOSIS — W57XXXA Bitten or stung by nonvenomous insect and other nonvenomous arthropods, initial encounter: Secondary | ICD-10-CM | POA: Diagnosis not present

## 2018-06-07 NOTE — Progress Notes (Signed)
Subjective: Ryan [name] is a 57 y.o. male patient seen in office for follow-up evaluation of wound to the right foot, suspected insect bite. Patient reports that the area has not gotten any bigger but can feel something is in it. Denies any pain, warmth, redness, or drainage from the area. Patient has completed Doxycycline with no issues. Has been applying neosporin and bandaid to area daily and taking Tylenol as needed for pain. No other pedal complaints at this time.   Patient Active Problem List   Diagnosis Date Noted  . Malnutrition of moderate degree 05/01/2018  . Cellulitis of right leg 04/28/2018  . Cellulitis 04/28/2018   Current Outpatient Medications on File Prior to Visit  Medication Sig Dispense Refill  . acetaminophen (TYLENOL) 500 MG tablet Take 500-2,000 mg by mouth every 4 (four) hours as needed (pain).     Marland Kitchen azelastine (ASTELIN) 0.1 % nasal spray Use two sprays in each nostril twice daily as needed for nasal drainage. (Patient taking differently: Place 2 sprays into both nostrils 2 (two) times daily as needed (nasal drainage). ) 30 mL 5  . Cyanocobalamin (VITAMIN B-12 PO) Take 1 drop by mouth daily.    Marland Kitchen doxycycline (VIBRA-TABS) 100 MG tablet Take 1 tablet (100 mg total) by mouth 2 (two) times daily. 28 tablet 0  . emtricitabine-tenofovir (TRUVADA) 200-300 MG tablet Take 1 tablet by mouth daily.    . feeding supplement, ENSURE ENLIVE, (ENSURE ENLIVE) LIQD Take 237 mLs by mouth 2 (two) times daily between meals. 237 mL 12  . fluvoxaMINE (LUVOX) 100 MG tablet Take 300 mg by mouth at bedtime.    . hydrocortisone cream 1 % Apply 1 application topically 2 (two) times daily as needed for itching (rash).    Marland Kitchen loperamide (IMODIUM A-D) 2 MG tablet Take 1-2 mg by mouth daily as needed for diarrhea or loose stools.    Marland Kitchen loratadine (CLARITIN) 10 MG tablet Take 10 mg by mouth daily as needed for allergies.    . mirabegron ER (MYRBETRIQ) 50 MG TB24 tablet Take 50 mg by mouth at bedtime.      . Multiple Vitamin (MULTIVITAMIN WITH MINERALS) TABS tablet Take 1 tablet by mouth daily.    . mupirocin ointment (BACTROBAN) 2 % Place 1 application into the nose 3 (three) times daily. Filled 04/26/18    . Neomy-Bacit-Polymyx-Pramoxine (NEOSPORIN/BURN RELIEF) 1 % OINT Apply 1 application topically daily as needed (wound care).    . Omega-3 Fatty Acids (FISH OIL PO) Take 1 capsule by mouth daily.    Marland Kitchen OVER THE COUNTER MEDICATION Place 1 drop into both eyes daily as needed (dry eyes). Similasin Complete Eye relief (homeopathic)    . OVER THE COUNTER MEDICATION Take 1 tablet by mouth daily. OTC testosterone booster    . oxyCODONE (OXY IR/ROXICODONE) 5 MG immediate release tablet Take 5 mg by mouth every 6 (six) hours as needed for severe pain.    . Probiotic Product (PROBIOTIC PO) Take 1 tablet by mouth daily.     . simethicone (MYLICON) 80 MG chewable tablet Chew 80 mg by mouth every 6 (six) hours as needed for flatulence.    . sodium chloride (OCEAN) 0.65 % SOLN nasal spray Place 1 spray into both nostrils daily.    . TURMERIC PO Take 1 capsule by mouth daily.     No current facility-administered medications on file prior to visit.    Allergies  Allergen Reactions  . Niacin Rash    Recent Results (from  the past 2160 hour(s))  Urinalysis, Routine w reflex microscopic     Status: Abnormal   Collection Time: 04/28/18  7:12 PM  Result Value Ref Range   Color, Urine STRAW (A) YELLOW   APPearance CLEAR CLEAR   Specific Gravity, Urine 1.006 1.005 - 1.030   pH 5.0 5.0 - 8.0   Glucose, UA NEGATIVE NEGATIVE mg/dL   Hgb urine dipstick MODERATE (A) NEGATIVE   Bilirubin Urine NEGATIVE NEGATIVE   Ketones, ur NEGATIVE NEGATIVE mg/dL   Protein, ur NEGATIVE NEGATIVE mg/dL   Nitrite NEGATIVE NEGATIVE   Leukocytes, UA NEGATIVE NEGATIVE   RBC / HPF 0-5 0 - 5 RBC/hpf   WBC, UA 0-5 0 - 5 WBC/hpf   Bacteria, UA NONE SEEN NONE SEEN    Comment: Performed at [facility], [address],  [place], [place] [address]  Comprehensive metabolic panel     Status: Abnormal   Collection Time: 04/28/18  7:17 PM  Result Value Ref Range   Sodium 135 135 - 145 mmol/L   Potassium 4.0 3.5 - 5.1 mmol/L   Chloride 99 (L) 101 - 111 mmol/L   CO2 27 22 - 32 mmol/L   Glucose, Bld 71 65 - 99 mg/dL   BUN 11 6 - 20 mg/dL   Creatinine, Ser 1.03 0.61 - 1.24 mg/dL   Calcium 8.6 (L) 8.9 - 10.3 mg/dL   Total Protein 6.7 6.5 - 8.1 g/dL   Albumin 3.7 3.5 - 5.0 g/dL   AST 26 15 - 41 U/L   ALT 16 (L) 17 - 63 U/L   Alkaline Phosphatase 55 38 - 126 U/L   Total Bilirubin 0.5 0.3 - 1.2 mg/dL   GFR calc non Af Amer >60 >60 mL/min   GFR calc Af Amer >60 >60 mL/min    Comment: (NOTE) The eGFR has been calculated using the CKD EPI equation. This calculation has not been validated in all clinical situations. eGFR's persistently <60 mL/min signify possible Chronic Kidney Disease.    Anion gap 9 5 - 15    Comment: Performed at [facility] [address] [place], [place] [address]  CBC with Differential     Status: Abnormal   Collection Time: 04/28/18  7:17 PM  Result Value Ref Range   WBC 3.1 (L) 4.0 - 10.5 K/uL   RBC 3.94 (L) 4.22 - 5.81 MIL/uL   Hemoglobin 13.1 13.0 - 17.0 g/dL   HCT 39.8 39.0 - 52.0 %   MCV 101.0 (H) 78.0 - 100.0 fL   MCH 33.2 26.0 - 34.0 pg   MCHC 32.9 30.0 - 36.0 g/dL   RDW 11.8 11.5 - 15.5 %   Platelets 150 150 - 400 K/uL   Neutrophils Relative % 54 %   Neutro Abs 1.6 (L) 1.7 - 7.7 K/uL   Lymphocytes Relative 28 %   Lymphs Abs 0.9 0.7 - 4.0 K/uL   Monocytes Relative 17 %   Monocytes Absolute 0.5 0.1 - 1.0 K/uL   Eosinophils Relative 0 %   Eosinophils Absolute 0.0 0.0 - 0.7 K/uL   Basophils Relative 1 %   Basophils Absolute 0.0 0.0 - 0.1 K/uL   Immature Granulocytes 0 %   Abs Immature Granulocytes 0.0 0.0 - 0.1 K/uL    Comment: Performed at [facility], [address] [place], [place] [address]  I-Stat CG4 Lactic Acid, ED     Status: None   Collection Time:  04/28/18  7:25 PM  Result Value Ref Range   Lactic Acid, Venous 1.10 0.5 - 1.9 mmol/L  Blood culture (routine x 2)     Status: None   Collection Time: 04/28/18 10:49 PM  Result Value Ref Range   Specimen Description BLOOD LEFT ANTECUBITAL    Special Requests      BOTTLES DRAWN AEROBIC AND ANAEROBIC Blood Culture adequate volume   Culture      NO GROWTH 5 DAYS Performed at [facility], [place] [address], [place], [place] [address]    Report Status 05/03/2018 FINAL   Blood culture (routine x 2)     Status: None   Collection Time: 04/28/18 10:55 PM  Result Value Ref Range   Specimen Description BLOOD RIGHT ANTECUBITAL    Special Requests      BOTTLES DRAWN AEROBIC AND ANAEROBIC Blood Culture adequate volume   Culture      NO GROWTH 5 DAYS Performed at [facility], [place] [address], [place], [place] [address]    Report Status 05/03/2018 FINAL   HIV antibody (Routine Testing)     Status: None   Collection Time: 04/29/18  5:01 AM  Result Value Ref Range   HIV Screen 4th Generation wRfx Non Reactive Non Reactive    Comment: (NOTE) Performed At: [facility] [address] [place], [place] [id number] Rush Farmer MD GQ:[id number] Performed at [facility], [place] [address], [place], [place] [address]   Basic metabolic panel     Status: Abnormal   Collection Time: 04/29/18  5:01 AM  Result Value Ref Range   Sodium 137 135 - 145 mmol/L   Potassium 4.3 3.5 - 5.1 mmol/L   Chloride 100 (L) 101 - 111 mmol/L   CO2 29 22 - 32 mmol/L   Glucose, Bld 149 (H) 65 - 99 mg/dL   BUN 11 6 - 20 mg/dL   Creatinine, Ser 1.07 0.61 - 1.24 mg/dL   Calcium 8.7 (L) 8.9 - 10.3 mg/dL   GFR calc non Af Amer >60 >60 mL/min   GFR calc Af Amer >60 >60 mL/min    Comment: (NOTE) The eGFR has been calculated using the CKD EPI equation. This calculation has not been validated in all clinical situations. eGFR's persistently <60 mL/min signify possible Chronic Kidney Disease.     Anion gap 8 5 - 15    Comment: Performed at [facility] [address] [place], [place] [address]  CBC     Status: Abnormal   Collection Time: 04/29/18  5:01 AM  Result Value Ref Range   WBC 3.5 (L) 4.0 - 10.5 K/uL   RBC 4.21 (L) 4.22 - 5.81 MIL/uL   Hemoglobin 14.1 13.0 - 17.0 g/dL   HCT 41.7 39.0 - 52.0 %   MCV 99.0 78.0 - 100.0 fL   MCH 33.5 26.0 - 34.0 pg   MCHC 33.8 30.0 - 36.0 g/dL   RDW 11.7 11.5 - 15.5 %   Platelets 166 150 - 400 K/uL    Comment: Performed at [facility], [place] [address] [place], [place] [address]  CBC     Status: Abnormal   Collection Time: 04/30/18  5:57 AM  Result Value Ref Range   WBC 4.0 4.0 - 10.5 K/uL   RBC 3.97 (L) 4.22 - 5.81 MIL/uL   Hemoglobin 13.2 13.0 - 17.0 g/dL   HCT 38.9 (L) 39.0 - 52.0 %   MCV 98.0 78.0 - 100.0 fL   MCH 33.2  26.0 - 34.0 pg   MCHC 33.9 30.0 - 36.0 g/dL   RDW 11.7 11.5 - 15.5 %   Platelets 145 (L) 150 - 400 K/uL    Comment: Performed at [facility], [place] [address], [place], [place] [address]  Basic metabolic panel     Status: Abnormal   Collection Time: 04/30/18  5:57 AM  Result Value Ref Range   Sodium 136 135 - 145 mmol/L   Potassium 4.3 3.5 - 5.1 mmol/L   Chloride 100 (L) 101 - 111 mmol/L   CO2 29 22 - 32 mmol/L   Glucose, Bld 109 (H) 65 - 99 mg/dL   BUN 11 6 - 20 mg/dL   Creatinine, Ser 1.10 0.61 - 1.24 mg/dL   Calcium 8.6 (L) 8.9 - 10.3 mg/dL   GFR calc non Af Amer >60 >60 mL/min   GFR calc Af Amer >60 >60 mL/min    Comment: (NOTE) The eGFR has been calculated using the CKD EPI equation. This calculation has not been validated in all clinical situations. eGFR's persistently <60 mL/min signify possible Chronic Kidney Disease.    Anion gap 7 5 - 15    Comment: Performed at [facility] [address], [place], [place] [address]    Objective: There were no vitals filed for this visit.  General: Patient is awake, alert, oriented x 3 and in no acute distress.  Dermatology:  Skin is warm and dry bilateral with a scabbed over wound at the dorsal surface of the right foot. There is no opening, no malodor, no active drainage, resolved erythema, no edema. No other acute signs of infection.    Vascular: Dorsalis Pedis pulse = 2/4 Bilateral,  Posterior Tibial pulse = 1/4 Bilateral,  Capillary Fill Time < 5 seconds.  Neurologic: Gross pedal sensation intact bilateral.  Musculosketal: There is minimal pain with palpation to previous ulcerated area. No pain with compression to calves bilateral.   No results for input(s): GRAMSTAIN, LABORGA in the last 8760 hours.  Assessment and Plan:  Problem List Items Addressed This Visit    None    Visit Diagnoses    Insect bite of right foot, initial encounter    -  Primary   prematurely healed   Right foot pain         -Examined patient  -Wound appears prematurely healed at right foot -Advised patient to continue with protective dressings to the area and avoid shoes that may rub the dorsum of the foot -Advised patient to closely monitor if area worsens or recurs to return to office immediately or go to emergency room -Patient to return to office  as neededor sooner if problems arise.  Landis Martins, DPM

## 2018-06-10 DIAGNOSIS — Z7251 High risk heterosexual behavior: Secondary | ICD-10-CM | POA: Diagnosis not present

## 2018-06-26 DIAGNOSIS — R350 Frequency of micturition: Secondary | ICD-10-CM | POA: Diagnosis not present

## 2018-07-18 DIAGNOSIS — R350 Frequency of micturition: Secondary | ICD-10-CM | POA: Diagnosis not present

## 2018-07-18 DIAGNOSIS — N3281 Overactive bladder: Secondary | ICD-10-CM | POA: Diagnosis not present

## 2018-07-18 DIAGNOSIS — J329 Chronic sinusitis, unspecified: Secondary | ICD-10-CM | POA: Diagnosis not present

## 2018-07-18 DIAGNOSIS — N3941 Urge incontinence: Secondary | ICD-10-CM | POA: Diagnosis not present

## 2018-07-18 DIAGNOSIS — M5134 Other intervertebral disc degeneration, thoracic region: Secondary | ICD-10-CM | POA: Diagnosis not present

## 2018-07-18 DIAGNOSIS — M5136 Other intervertebral disc degeneration, lumbar region: Secondary | ICD-10-CM | POA: Diagnosis not present

## 2018-07-18 DIAGNOSIS — N400 Enlarged prostate without lower urinary tract symptoms: Secondary | ICD-10-CM | POA: Diagnosis not present

## 2018-07-18 DIAGNOSIS — N3943 Post-void dribbling: Secondary | ICD-10-CM | POA: Diagnosis not present

## 2018-07-26 DIAGNOSIS — I83892 Varicose veins of left lower extremities with other complications: Secondary | ICD-10-CM | POA: Diagnosis not present

## 2018-07-26 DIAGNOSIS — I83813 Varicose veins of bilateral lower extremities with pain: Secondary | ICD-10-CM | POA: Diagnosis not present

## 2018-08-05 DIAGNOSIS — M545 Low back pain: Secondary | ICD-10-CM | POA: Diagnosis not present

## 2018-08-05 DIAGNOSIS — R32 Unspecified urinary incontinence: Secondary | ICD-10-CM | POA: Diagnosis not present

## 2018-08-09 DIAGNOSIS — M545 Low back pain: Secondary | ICD-10-CM | POA: Diagnosis not present

## 2018-08-09 DIAGNOSIS — R32 Unspecified urinary incontinence: Secondary | ICD-10-CM | POA: Diagnosis not present

## 2018-08-16 DIAGNOSIS — M5136 Other intervertebral disc degeneration, lumbar region: Secondary | ICD-10-CM | POA: Diagnosis not present

## 2018-08-16 DIAGNOSIS — M545 Low back pain: Secondary | ICD-10-CM | POA: Diagnosis not present

## 2018-08-20 ENCOUNTER — Ambulatory Visit: Payer: Medicare Other | Admitting: Allergy

## 2018-08-23 DIAGNOSIS — J0120 Acute ethmoidal sinusitis, unspecified: Secondary | ICD-10-CM | POA: Diagnosis not present

## 2018-08-28 DIAGNOSIS — N3281 Overactive bladder: Secondary | ICD-10-CM | POA: Diagnosis not present

## 2018-08-29 DIAGNOSIS — I83813 Varicose veins of bilateral lower extremities with pain: Secondary | ICD-10-CM | POA: Diagnosis not present

## 2018-08-29 DIAGNOSIS — I83892 Varicose veins of left lower extremities with other complications: Secondary | ICD-10-CM | POA: Diagnosis not present

## 2018-09-04 DIAGNOSIS — M545 Low back pain: Secondary | ICD-10-CM | POA: Diagnosis not present

## 2018-09-10 DIAGNOSIS — I83813 Varicose veins of bilateral lower extremities with pain: Secondary | ICD-10-CM | POA: Diagnosis not present

## 2018-09-11 DIAGNOSIS — N400 Enlarged prostate without lower urinary tract symptoms: Secondary | ICD-10-CM | POA: Diagnosis not present

## 2018-09-11 DIAGNOSIS — Z0000 Encounter for general adult medical examination without abnormal findings: Secondary | ICD-10-CM | POA: Diagnosis not present

## 2018-09-11 DIAGNOSIS — E7800 Pure hypercholesterolemia, unspecified: Secondary | ICD-10-CM | POA: Diagnosis not present

## 2018-09-11 DIAGNOSIS — Z23 Encounter for immunization: Secondary | ICD-10-CM | POA: Diagnosis not present

## 2018-09-11 DIAGNOSIS — Z1389 Encounter for screening for other disorder: Secondary | ICD-10-CM | POA: Diagnosis not present

## 2018-09-11 DIAGNOSIS — N3281 Overactive bladder: Secondary | ICD-10-CM | POA: Diagnosis not present

## 2018-09-11 DIAGNOSIS — M5136 Other intervertebral disc degeneration, lumbar region: Secondary | ICD-10-CM | POA: Diagnosis not present

## 2018-09-11 DIAGNOSIS — M545 Low back pain: Secondary | ICD-10-CM | POA: Diagnosis not present

## 2018-09-11 DIAGNOSIS — M48061 Spinal stenosis, lumbar region without neurogenic claudication: Secondary | ICD-10-CM | POA: Diagnosis not present

## 2018-09-11 DIAGNOSIS — J302 Other seasonal allergic rhinitis: Secondary | ICD-10-CM | POA: Diagnosis not present

## 2018-09-18 DIAGNOSIS — M545 Low back pain: Secondary | ICD-10-CM | POA: Diagnosis not present

## 2018-09-23 DIAGNOSIS — M545 Low back pain: Secondary | ICD-10-CM | POA: Diagnosis not present

## 2018-09-26 DIAGNOSIS — J069 Acute upper respiratory infection, unspecified: Secondary | ICD-10-CM | POA: Diagnosis not present

## 2018-09-30 DIAGNOSIS — M545 Low back pain: Secondary | ICD-10-CM | POA: Diagnosis not present

## 2018-10-02 DIAGNOSIS — M545 Low back pain: Secondary | ICD-10-CM | POA: Diagnosis not present

## 2018-10-02 DIAGNOSIS — N3281 Overactive bladder: Secondary | ICD-10-CM | POA: Diagnosis not present

## 2018-10-09 DIAGNOSIS — M545 Low back pain: Secondary | ICD-10-CM | POA: Diagnosis not present

## 2018-10-11 DIAGNOSIS — M545 Low back pain: Secondary | ICD-10-CM | POA: Diagnosis not present

## 2018-10-18 DIAGNOSIS — M545 Low back pain: Secondary | ICD-10-CM | POA: Diagnosis not present

## 2018-10-18 DIAGNOSIS — F3481 Disruptive mood dysregulation disorder: Secondary | ICD-10-CM | POA: Diagnosis not present

## 2018-10-23 DIAGNOSIS — I83813 Varicose veins of bilateral lower extremities with pain: Secondary | ICD-10-CM | POA: Diagnosis not present

## 2018-10-23 DIAGNOSIS — I83892 Varicose veins of left lower extremities with other complications: Secondary | ICD-10-CM | POA: Diagnosis not present

## 2018-10-25 DIAGNOSIS — M545 Low back pain: Secondary | ICD-10-CM | POA: Diagnosis not present

## 2018-10-31 ENCOUNTER — Encounter

## 2018-10-31 ENCOUNTER — Ambulatory Visit (INDEPENDENT_AMBULATORY_CARE_PROVIDER_SITE_OTHER): Payer: Medicare HMO | Admitting: Neurology

## 2018-10-31 ENCOUNTER — Encounter: Payer: Self-pay | Admitting: Neurology

## 2018-10-31 ENCOUNTER — Telehealth: Payer: Self-pay | Admitting: Neurology

## 2018-10-31 VITALS — BP 134/82 | HR 74 | Ht 75.0 in | Wt 170.0 lb

## 2018-10-31 DIAGNOSIS — E538 Deficiency of other specified B group vitamins: Secondary | ICD-10-CM | POA: Diagnosis not present

## 2018-10-31 DIAGNOSIS — N319 Neuromuscular dysfunction of bladder, unspecified: Secondary | ICD-10-CM

## 2018-10-31 DIAGNOSIS — R799 Abnormal finding of blood chemistry, unspecified: Secondary | ICD-10-CM | POA: Diagnosis not present

## 2018-10-31 NOTE — Progress Notes (Signed)
Reason for visit: Neurogenic bladder  Referring physician: Dr. Nona Dell  Ryan [name] is a 57 y.o. male  History of present illness:  Ryan [name] is a 57 year old right-handed white male with a history of difficulty with micturition syncope that began about 5 years ago.  The patient had 2 such events that occurred while voiding the bladder.  Shortly thereafter, the patient began having problems with bladder control.  He did have a transurethral laser surgery for benign prostatic hypertrophy.  Following this procedure, the patient has continued note some problems with urinary urgency and incontinence.  The patient will go to the bathroom and void the bladder, he will get up from the toilet and then have urgency and incontinence of the bladder shortly thereafter.  The patient has been followed through urology, he has had urodynamic studies have shown a small contracted bladder.  The possibility of a neurogenic bladder was entertained, the patient has undergone MRI of the lumbar spine and thoracic spine, no definite cause of the bladder control issues have been noted.  The patient denies any numbness or weakness of the extremities, with sitting he may note sciatica going down the left leg.  The patient reports a mild problem with balance, he has not had any falls.  He does get daily headaches.  He is sent to this office for further evaluation.  He reports a longstanding history of obsessive-compulsive disorder.  Past Medical History:  Diagnosis Date  . Anemia    hx  . Arthritis    "joints of fingers; ankles" (04/29/2018)  . BPH (benign prostatic hyperplasia)   . Bulging lumbar disc dx'd 06/2016  . Cellulitis of leg, right 04/28/2018  . Chronic sinusitis   . Daily headache    "daily lately; at least weekly" (04/29/2018)  . GERD (gastroesophageal reflux disease)   . Hepatitis C ~ 2000   "turned yellow; jaundiced; not sure which kind of hepatitis" (04/29/2018)  . IBS (irritable bowel syndrome)     . OCD (obsessive compulsive disorder)   . OCD (obsessive compulsive disorder)   . Overactive bladder   . Reiter's syndrome [facility])     Past Surgical History:  Procedure Laterality Date  . CLOSED REDUCTION TOE FRACTURE Left 05/2014   "titanium plate in" 2nd digit  . FRACTURE SURGERY    . INGUINAL HERNIA REPAIR Left 06/2008  . NASAL SEPTUM SURGERY  2016  . PROSTATE SURGERY  2015   transurethral laser-induced prostatectomy  . RHINOPLASTY  2018  . TONSILLECTOMY AND ADENOIDECTOMY      Family History  Problem Relation Age of Onset  . Allergic rhinitis Mother   . Alzheimer's disease Father   . Breast cancer Maternal Grandmother     Social history:  reports that he quit smoking about 7 years ago. His smoking use included cigarettes. He has a 3.12 pack-year smoking history. He has never used smokeless tobacco. He reports current alcohol use of about 1.0 standard drinks of alcohol per week. He reports previous drug use. Drugs: Cocaine and Marijuana.  Medications:  Prior to Admission medications   Medication Sig Start Date End Date Taking? Authorizing Provider  acetaminophen (TYLENOL) 500 MG tablet Take 500-2,000 mg by mouth every 4 (four) hours as needed (pain).    Yes [provider]  fluticasone (FLONASE) 50 MCG/ACT nasal spray Place into both nostrils daily.   Yes [provider]  fluvoxaMINE (LUVOX) 100 MG tablet Take three (3) tablets by mouth at bedtime 10/18/18  Yes  [provider]  mirabegron ER (MYRBETRIQ) 50 MG TB24 tablet  10/02/18  Yes [provider]  Oxcarbazepine (TRILEPTAL) 300 MG tablet Take 300 mg by mouth daily.   Yes [provider]      Allergies  Allergen Reactions  . Niacin Rash    ROS:  Out of a complete 14 system review of symptoms, the patient complains only of the following symptoms, and all other reviewed systems are negative.  Urinary urgency, incontinence Balance problems  Blood pressure 134/82, pulse  74, height 6\' 3"  (1.905 m), weight 170 lb (77.1 kg).  Physical Exam  General: The patient is alert and cooperative at the time of the examination.  Eyes: Pupils are equal, round, and reactive to light. Discs are flat bilaterally.  Neck: The neck is supple, no carotid bruits are noted.  Respiratory: The respiratory examination is clear.  Cardiovascular: The cardiovascular examination reveals a regular rate and rhythm, no obvious murmurs or rubs are noted.  Skin: Extremities are without significant edema.  Neurologic Exam  Mental status: The patient is alert and oriented x 3 at the time of the examination. The patient has apparent normal recent and remote memory, with an apparently normal attention span and concentration ability.  Cranial nerves: Facial symmetry is present. There is good sensation of the face to pinprick and soft touch bilaterally. The strength of the facial muscles and the muscles to head turning and shoulder shrug are normal bilaterally. Speech is well enunciated, no aphasia or dysarthria is noted. Extraocular movements are full. Visual fields are full. The tongue is midline, and the patient has symmetric elevation of the soft palate. No obvious hearing deficits are noted.  Motor: The motor testing reveals 5 over 5 strength of all 4 extremities. Good symmetric motor tone is noted throughout.  Sensory: Sensory testing is intact to pinprick, soft touch, vibration sensation, and position sense on all 4 extremities. No evidence of extinction is noted.  Coordination: Cerebellar testing reveals good finger-nose-finger and heel-to-shin bilaterally.  Gait and station: Gait is normal. Tandem gait is slightly unsteady. Romberg is negative. No drift is seen.  Reflexes: Deep tendon reflexes are symmetric and normal bilaterally. Toes are downgoing bilaterally.   Assessment/Plan:  1.  Neurogenic bladder  The patient will be sent for further evaluation looking for central  nervous system etiology for his bladder control problems.  He will have MRI of the brain and cervical spine with and without gadolinium enhancement.  He will be set up for further blood work today.  If the above work-up is unremarkable, he is to return to his urologist for further management.  The Myrbetriq has helped his bladder control some.  Ryan Alexanders MD 10/31/2018 10:39 AM  [facility] [address] [place], [place] [address]  Phone [phone] Fax [phone]

## 2018-10-31 NOTE — Telephone Encounter (Signed)
[employer] pending faxed clinical notes  °

## 2018-11-01 DIAGNOSIS — R350 Frequency of micturition: Secondary | ICD-10-CM | POA: Diagnosis not present

## 2018-11-01 LAB — VITAMIN B12: Vitamin B-12: 697 pg/mL (ref 232–1245)

## 2018-11-01 LAB — ANA W/REFLEX: Anti Nuclear Antibody(ANA): NEGATIVE

## 2018-11-01 LAB — RPR: RPR: NONREACTIVE

## 2018-11-01 LAB — ANGIOTENSIN CONVERTING ENZYME: Angio Convert Enzyme: 42 U/L (ref 14–82)

## 2018-11-01 LAB — SEDIMENTATION RATE: SED RATE: 2 mm/h (ref 0–30)

## 2018-11-01 LAB — B. BURGDORFI ANTIBODIES

## 2018-11-04 ENCOUNTER — Telehealth: Payer: Self-pay | Admitting: Neurology

## 2018-11-04 ENCOUNTER — Telehealth: Payer: Self-pay

## 2018-11-04 DIAGNOSIS — R05 Cough: Secondary | ICD-10-CM | POA: Diagnosis not present

## 2018-11-04 DIAGNOSIS — J069 Acute upper respiratory infection, unspecified: Secondary | ICD-10-CM | POA: Diagnosis not present

## 2018-11-04 NOTE — Telephone Encounter (Signed)
I contacted the patient and advised of results. Patient advised of results and voiced understanding.  Patient requested I send a message to the schedulers about the MRI order for 10/31/2018 about scheduling test.

## 2018-11-04 NOTE — Telephone Encounter (Signed)
-----   Message from Kathrynn Ducking, MD sent at 11/03/2018  9:26 AM EST -----  The blood work results are unremarkable. Please call the patient. ----- Message ----- From: [facility] Results In Sent: 11/01/2018   7:39 AM EST To: Kathrynn Ducking, MD

## 2018-11-04 NOTE — Telephone Encounter (Signed)
Spoke to the patient and informed him that the MRI's are still pending with his [employer] his insurance.

## 2018-11-04 NOTE — Telephone Encounter (Signed)
Dr. Jannifer Franklin Dr. Justin Mend called and left a message on the voicemail asking for you to return his call [phone] about  MRI on this patient.

## 2018-11-07 NOTE — Telephone Encounter (Signed)
I contacted Dr. Justin Mend at the number listed and left a vm advising Dr. Jannifer Franklin is out of the office and will return on 11/11/2018. I advised if Dr. Justin Mend would like to discuss MRI questions with me I would be available today, but if not Dr. Jannifer Franklin will be back on 11/11/2018.

## 2018-11-08 NOTE — Telephone Encounter (Signed)
I called the patient. He wanted the MRI studies to be done with contrast. They have been ordered with and without gad. Contrast.

## 2018-11-11 NOTE — Telephone Encounter (Signed)
Patient is aware 

## 2018-11-11 NOTE — Telephone Encounter (Signed)
Mcarthur Rossetti Josem Kaufmann: [phone] (exp. 10/31/18 to 11/30/18) order sent to [facility].

## 2018-11-11 NOTE — Telephone Encounter (Signed)
I called to make patient aware MRI's were approved and had been sent to GI. He stated he has their phone number already and would call.

## 2018-11-14 NOTE — Telephone Encounter (Signed)
Ryan [name] Ryan [name]: [phone] (exp. 11/14/18 to 12/14/18) patient is scheduled at GI for 11/22/18.

## 2018-11-14 NOTE — Telephone Encounter (Signed)
Mcarthur Rossetti Josem Kaufmann: [phone] (exp. 11/14/18 to 12/14/18) patient is scheduled at GI for 11/22/18.

## 2018-11-15 ENCOUNTER — Ambulatory Visit
Admission: RE | Admit: 2018-11-15 | Discharge: 2018-11-15 | Disposition: A | Discharge status: Home / Self Care | Payer: Medicare HMO | Source: Ambulatory Visit | Attending: Physician Assistant | Admitting: Physician Assistant

## 2018-11-15 ENCOUNTER — Other Ambulatory Visit: Payer: Self-pay | Admitting: Physician Assistant

## 2018-11-15 DIAGNOSIS — M545 Low back pain, unspecified: Secondary | ICD-10-CM

## 2018-11-15 DIAGNOSIS — I83811 Varicose veins of right lower extremities with pain: Secondary | ICD-10-CM | POA: Diagnosis not present

## 2018-11-15 DIAGNOSIS — M47816 Spondylosis without myelopathy or radiculopathy, lumbar region: Secondary | ICD-10-CM | POA: Diagnosis not present

## 2018-11-15 DIAGNOSIS — I8311 Varicose veins of right lower extremity with inflammation: Secondary | ICD-10-CM | POA: Diagnosis not present

## 2018-11-20 DIAGNOSIS — M545 Low back pain: Secondary | ICD-10-CM | POA: Diagnosis not present

## 2018-11-22 ENCOUNTER — Ambulatory Visit
Admission: RE | Admit: 2018-11-22 | Discharge: 2018-11-22 | Disposition: A | Discharge status: Home / Self Care | Payer: Medicare HMO | Source: Ambulatory Visit | Attending: Neurology | Admitting: Neurology

## 2018-11-22 ENCOUNTER — Other Ambulatory Visit: Payer: Medicare HMO

## 2018-11-22 DIAGNOSIS — N319 Neuromuscular dysfunction of bladder, unspecified: Secondary | ICD-10-CM

## 2018-11-22 DIAGNOSIS — R202 Paresthesia of skin: Secondary | ICD-10-CM | POA: Diagnosis not present

## 2018-11-22 MED ORDER — GADOBENATE DIMEGLUMINE 529 MG/ML IV SOLN
15.0000 mL | Freq: Once | INTRAVENOUS | Status: AC | PRN
  Administered 2018-11-22: 15 mL via INTRAVENOUS

## 2018-11-24 ENCOUNTER — Telehealth: Payer: Self-pay | Admitting: Neurology

## 2018-11-24 NOTE — Telephone Encounter (Signed)
  I called the patient. The MRI study evaluations do not show a cause of the bladder dysfunction. I will send the report to his urologist.    MRI brain 11/22/18:  IMPRESSION: This is a normal MRI of the brain with and without contrast for age.  Incidental note is made of left maxillary and bilateral ethmoid and frontal sinusitis.   MRI cervical 11/22/18:  IMPRESSION: This MRI of the cervical spine with and without contrast shows the following: 1.    The spinal cord appears normal. 2.    There are multilevel degenerative changes as detailed above that do not lead to nerve root compression or spinal stenosis. 3.    There is a normal enhancement pattern.

## 2018-11-25 ENCOUNTER — Telehealth: Payer: Self-pay | Admitting: Neurology

## 2018-11-25 DIAGNOSIS — M545 Low back pain: Secondary | ICD-10-CM | POA: Diagnosis not present

## 2018-11-25 DIAGNOSIS — M5432 Sciatica, left side: Secondary | ICD-10-CM

## 2018-11-25 NOTE — Telephone Encounter (Signed)
Patient calling after receiving mri result. He thought he was having full spine MRI. He is not sure what is next step for burning foot  Please call

## 2018-11-25 NOTE — Telephone Encounter (Signed)
I called the patient.  The patient has already had MRI of the lumbar spine and thoracic spine before the MRI of the brain and cervical spine was done.  The entire spine has been evaluated.  The patient does have some left-sided sciatica issues, if the patient desires, we can do ongoing evaluation with EMG and nerve conduction study, MRI of the lumbar spine does show some potential for nerve root impingement on the left L4 nerve root.  The patient will call me and let me know if he wishes to pursue further evaluation.

## 2018-11-26 NOTE — Telephone Encounter (Signed)
Pt has called back to ask that Dr Jannifer Franklin puts the order in for the NCV/EMG, he would like to have it done

## 2018-11-26 NOTE — Addendum Note (Signed)
Addended by: Kathrynn Ducking on: 11/26/2018 10:42 AM   Modules accepted: Orders

## 2018-11-26 NOTE — Telephone Encounter (Signed)
The patient has requested EMG study, I will get this set up.  We will do nerve conduction studies on both legs, EMG on the left leg.

## 2018-11-29 DIAGNOSIS — I83811 Varicose veins of right lower extremities with pain: Secondary | ICD-10-CM | POA: Diagnosis not present

## 2018-11-29 DIAGNOSIS — I8311 Varicose veins of right lower extremity with inflammation: Secondary | ICD-10-CM | POA: Diagnosis not present

## 2018-12-02 DIAGNOSIS — J0101 Acute recurrent maxillary sinusitis: Secondary | ICD-10-CM | POA: Diagnosis not present

## 2018-12-11 DIAGNOSIS — N3281 Overactive bladder: Secondary | ICD-10-CM | POA: Diagnosis not present

## 2018-12-20 DIAGNOSIS — F422 Mixed obsessional thoughts and acts: Secondary | ICD-10-CM | POA: Diagnosis not present

## 2018-12-24 ENCOUNTER — Ambulatory Visit (INDEPENDENT_AMBULATORY_CARE_PROVIDER_SITE_OTHER): Payer: Medicare HMO | Admitting: Neurology

## 2018-12-24 ENCOUNTER — Encounter (INDEPENDENT_AMBULATORY_CARE_PROVIDER_SITE_OTHER): Payer: Self-pay

## 2018-12-24 ENCOUNTER — Telehealth: Payer: Self-pay | Admitting: Neurology

## 2018-12-24 ENCOUNTER — Encounter

## 2018-12-24 ENCOUNTER — Encounter: Payer: Self-pay | Admitting: Neurology

## 2018-12-24 DIAGNOSIS — M5432 Sciatica, left side: Secondary | ICD-10-CM

## 2018-12-24 NOTE — Telephone Encounter (Signed)
As we had discussed in our visit, I do not think that an EEG study is indicated for evaluation of his complaints of dizziness.  MRI of the brain has already been done, this appears to be unremarkable.

## 2018-12-24 NOTE — Procedures (Signed)
     HISTORY:  Ryan [name] is a 58 year old gentleman with a history of a neurogenic bladder.  The patient also reports a history of left-sided low back pain and some occasional pain down the left leg.  The patient has had MRI of the lumbar spine done in the past that questions impingement of the left L4 nerve root.  The patient is being evaluated for possible radiculopathy.   NERVE CONDUCTION STUDIES:  Nerve conduction studies were performed on both lower extremities. The distal motor latencies and motor amplitudes for the peroneal and posterior tibial nerves were within normal limits. The nerve conduction velocities for these nerves were also normal. The sensory latencies for the peroneal and sural nerves were within normal limits. The F wave latencies for the posterior tibial nerves were within normal limits.   EMG STUDIES:  EMG study was performed on the left lower extremity:  The tibialis anterior muscle reveals 2 to 4K motor units with full recruitment. No fibrillations or positive waves were seen. The peroneus tertius muscle reveals 2 to 4K motor units with full recruitment. No fibrillations or positive waves were seen. The medial gastrocnemius muscle reveals 1 to 3K motor units with full recruitment. No fibrillations or positive waves were seen. The vastus lateralis muscle reveals 2 to 4K motor units with full recruitment. No fibrillations or positive waves were seen. The iliopsoas muscle reveals 2 to 4K motor units with full recruitment. No fibrillations or positive waves were seen. The biceps femoris muscle (long head) reveals 2 to 4K motor units with full recruitment. No fibrillations or positive waves were seen. The lumbosacral paraspinal muscles were tested at 3 levels, and revealed no abnormalities of insertional activity at all 3 levels tested. There was good relaxation.   IMPRESSION:  Nerve conduction studies done on both lower extremities were within normal limits.  No  evidence of a peripheral neuropathy was seen.  EMG evaluation of the left lower extremity was unremarkable, no evidence of an overlying lumbosacral radiculopathy was seen.  Jill Alexanders MD 12/24/2018 1:50 PM  [facility] [address] [place], [place] [address]  Phone [phone] Fax [phone]

## 2018-12-24 NOTE — Progress Notes (Signed)
Please refer to EMG and nerve conduction procedure note.  

## 2018-12-24 NOTE — Progress Notes (Signed)
[place]    Nerve / Sites Muscle Latency Ref. Amplitude Ref. Rel Amp Segments Distance Velocity Ref. Area    ms ms mV mV %  cm m/s m/s mVms  R Peroneal - EDB     Ankle EDB 5.1 ?6.5 3.3 ?2.0 100 Ankle - EDB 9   13.7     Fib head EDB 12.7  2.6  78.4 Fib head - Ankle 33 44 ?44 12.9     Pop fossa EDB 14.9  2.5  97.5 Pop fossa - Fib head 10 44 ?44 12.2         Pop fossa - Ankle      L Peroneal - EDB     Ankle EDB 5.9 ?6.5 2.1 ?2.0 100 Ankle - EDB 9   8.7     Fib head EDB 13.6  1.8  84.4 Fib head - Ankle 34 44 ?44 7.6     Pop fossa EDB 15.8  1.7  98.8 Pop fossa - Fib head 10 45 ?44 7.0         Pop fossa - Ankle      R Tibial - AH     Ankle AH 3.6 ?5.8 4.2 ?4.0 100 Ankle - AH 9   11.6     Pop fossa AH 14.2  2.5  58.1 Pop fossa - Ankle 43 41 ?41 11.1  L Tibial - AH     Ankle AH 3.9 ?5.8 6.9 ?4.0 100 Ankle - AH 9   15.7     Pop fossa AH 14.3  4.2  61 Pop fossa - Ankle 42 41 ?41 10.0             SNC    Nerve / Sites Rec. Site Peak Lat Ref.  Amp Ref. Segments Distance    ms ms V V  cm  R Sural - Ankle (Calf)     Calf Ankle 3.9 ?4.4 7 ?6 Calf - Ankle 14  L Sural - Ankle (Calf)     Calf Ankle 4.0 ?4.4 7 ?6 Calf - Ankle 14  R Superficial peroneal - Ankle     Lat leg Ankle 4.4 ?4.4 5 ?6 Lat leg - Ankle 14  L Superficial peroneal - Ankle     Lat leg Ankle 4.4 ?4.4 5 ?6 Lat leg - Ankle 14              F  Wave    Nerve F Lat Ref.   ms ms  R Tibial - AH 57.4 ?56.0  L Tibial - AH 58.0 ?56.0

## 2018-12-24 NOTE — Telephone Encounter (Signed)
At [place], patient has requested to have an EEG due to him having dizzy spells and almost passing out. He wants to know if Dr. Jannifer Franklin is okay with him having an EEG and can put the orders in?

## 2019-01-01 ENCOUNTER — Other Ambulatory Visit: Payer: Self-pay | Admitting: Neurology

## 2019-01-01 DIAGNOSIS — R55 Syncope and collapse: Secondary | ICD-10-CM

## 2019-01-01 DIAGNOSIS — R350 Frequency of micturition: Secondary | ICD-10-CM | POA: Diagnosis not present

## 2019-01-08 DIAGNOSIS — L03211 Cellulitis of face: Secondary | ICD-10-CM | POA: Diagnosis not present

## 2019-01-08 DIAGNOSIS — J3489 Other specified disorders of nose and nasal sinuses: Secondary | ICD-10-CM | POA: Diagnosis not present

## 2019-01-08 DIAGNOSIS — J069 Acute upper respiratory infection, unspecified: Secondary | ICD-10-CM | POA: Diagnosis not present

## 2019-01-08 DIAGNOSIS — J329 Chronic sinusitis, unspecified: Secondary | ICD-10-CM | POA: Diagnosis not present

## 2019-01-08 DIAGNOSIS — L989 Disorder of the skin and subcutaneous tissue, unspecified: Secondary | ICD-10-CM | POA: Diagnosis not present

## 2019-01-15 DIAGNOSIS — N3941 Urge incontinence: Secondary | ICD-10-CM | POA: Diagnosis not present

## 2019-01-16 ENCOUNTER — Encounter: Payer: Self-pay | Admitting: Sports Medicine

## 2019-01-16 ENCOUNTER — Ambulatory Visit (INDEPENDENT_AMBULATORY_CARE_PROVIDER_SITE_OTHER): Payer: Medicare HMO | Admitting: Sports Medicine

## 2019-01-16 DIAGNOSIS — I8312 Varicose veins of left lower extremity with inflammation: Secondary | ICD-10-CM | POA: Diagnosis not present

## 2019-01-16 DIAGNOSIS — L309 Dermatitis, unspecified: Secondary | ICD-10-CM | POA: Diagnosis not present

## 2019-01-16 DIAGNOSIS — M79671 Pain in right foot: Secondary | ICD-10-CM

## 2019-01-16 DIAGNOSIS — I83812 Varicose veins of left lower extremities with pain: Secondary | ICD-10-CM | POA: Diagnosis not present

## 2019-01-16 MED ORDER — BETAMETHASONE DIPROPIONATE 0.05 % EX CREA: TOPICAL_CREAM | Freq: Two times a day (BID) | CUTANEOUS | 0 refills | Status: DC

## 2019-01-16 NOTE — Progress Notes (Signed)
Subjective: Ryan [name] is a 58 y.o. male patient seen in office for follow-up evaluation of wound to the right foot, suspected insect bite. Patient reports that the area has healed and there is no problems at one point the area was flesh-colored and now is starting to turn a little brown to become itchy patient wants to have the area checked because he was concerned that it may be something developing there again due to the brown discoloration.  Patient denies any increased redness warmth swelling or any problems other than skin discoloration and itching.  Denies nausea vomiting fever chills.  No other pedal complaints noted.   Patient Active Problem List   Diagnosis Date Noted  . Malnutrition of moderate degree 05/01/2018  . Cellulitis of right leg 04/28/2018  . Cellulitis 04/28/2018   Current Outpatient Medications on File Prior to Visit  Medication Sig Dispense Refill  . acetaminophen (TYLENOL) 500 MG tablet Take 500-2,000 mg by mouth every 4 (four) hours as needed (pain).     . fluticasone (FLONASE) 50 MCG/ACT nasal spray Place into both nostrils daily.    . fluvoxaMINE (LUVOX) 100 MG tablet Take three (3) tablets by mouth at bedtime    . mirabegron ER (MYRBETRIQ) 50 MG TB24 tablet     . Oxcarbazepine (TRILEPTAL) 300 MG tablet Take 300 mg by mouth daily.     No current facility-administered medications on file prior to visit.    Allergies  Allergen Reactions  . Niacin Rash    Recent Results (from the past 2160 hour(s))  Vitamin B12     Status: None   Collection Time: 10/31/18 11:18 AM  Result Value Ref Range   Vitamin B-12 697 232 - 1,245 pg/mL  RPR     Status: None   Collection Time: 10/31/18 11:18 AM  Result Value Ref Range   RPR Ser Ql Non Reactive Non Reactive  Sedimentation rate     Status: None   Collection Time: 10/31/18 11:18 AM  Result Value Ref Range   Sed Rate 2 0 - 30 mm/hr  Angiotensin converting enzyme     Status: None   Collection Time: 10/31/18 11:18 AM   Result Value Ref Range   Angio Convert Enzyme 42 14 - 82 U/L  ANA w/Reflex     Status: None   Collection Time: 10/31/18 11:18 AM  Result Value Ref Range   Anti Nuclear Antibody(ANA) Negative Negative  B. burgdorfi antibodies     Status: None   Collection Time: 10/31/18 11:18 AM  Result Value Ref Range   Lyme IgG/IgM Ab <0.91 0.00 - 0.90 ISR    Comment:                                 Negative         <0.91                                 Equivocal  0.91 - 1.09                                 Positive         >1.09     Objective: There were no vitals filed for this visit.  General: Patient is awake, alert, oriented x 3 and in no acute distress.  Dermatology: Skin is warm and dry bilateral with a small hyperpigmented patch noted to the dorsum of the right foot at previous area of suspected insect bite with mild excoriation from itching. There is no opening, no malodor, no active drainage, resolved erythema, no edema however subjectively patient states that he thinks he may can feel a little knot there even though on exam I do not feel anything that is concerning. No other acute signs of infection.    Vascular: Dorsalis Pedis pulse = 2/4 Bilateral,  Posterior Tibial pulse = 1/4 Bilateral,  Capillary Fill Time < 5 seconds.  Neurologic: Gross pedal sensation intact bilateral.  Musculosketal: There is no pain with palpation to previous suspected spider bite area of the dorsum of the right foot.  No pain with compression to calves bilateral.   No results for input(s): GRAMSTAIN, LABORGA in the last 8760 hours.  Assessment and Plan:  Problem List Items Addressed This Visit    None    Visit Diagnoses    Dermatitis    -  Primary   Right foot pain         -Examined patient  -Discussed with patient likely dermatitis secondary to itching which can cause changes to skin -Prescribed betamethasone to use twice daily for the next 2 weeks -Advised patient to closely monitor if area  worsens or recurs to return to office immediately or go to emergency room -Patient to return to office  as needed or sooner if problems arise.  Landis Martins, DPM

## 2019-01-22 DIAGNOSIS — N3941 Urge incontinence: Secondary | ICD-10-CM | POA: Diagnosis not present

## 2019-01-24 DIAGNOSIS — D487 Neoplasm of uncertain behavior of other specified sites: Secondary | ICD-10-CM | POA: Diagnosis not present

## 2019-01-24 DIAGNOSIS — D485 Neoplasm of uncertain behavior of skin: Secondary | ICD-10-CM | POA: Diagnosis not present

## 2019-01-24 DIAGNOSIS — L300 Nummular dermatitis: Secondary | ICD-10-CM | POA: Diagnosis not present

## 2019-01-25 DIAGNOSIS — M79662 Pain in left lower leg: Secondary | ICD-10-CM | POA: Diagnosis not present

## 2019-01-25 DIAGNOSIS — M25572 Pain in left ankle and joints of left foot: Secondary | ICD-10-CM | POA: Diagnosis not present

## 2019-01-30 DIAGNOSIS — L300 Nummular dermatitis: Secondary | ICD-10-CM | POA: Diagnosis not present

## 2019-02-04 DIAGNOSIS — I788 Other diseases of capillaries: Secondary | ICD-10-CM | POA: Diagnosis not present

## 2019-02-04 DIAGNOSIS — I83812 Varicose veins of left lower extremities with pain: Secondary | ICD-10-CM | POA: Diagnosis not present

## 2019-02-04 DIAGNOSIS — I8312 Varicose veins of left lower extremity with inflammation: Secondary | ICD-10-CM | POA: Diagnosis not present

## 2019-02-17 ENCOUNTER — Other Ambulatory Visit: Payer: Self-pay

## 2019-02-17 ENCOUNTER — Ambulatory Visit (INDEPENDENT_AMBULATORY_CARE_PROVIDER_SITE_OTHER): Payer: Medicare HMO | Admitting: Neurology

## 2019-02-17 DIAGNOSIS — R55 Syncope and collapse: Secondary | ICD-10-CM

## 2019-02-18 ENCOUNTER — Telehealth: Payer: Self-pay | Admitting: Neurology

## 2019-02-18 NOTE — Procedures (Signed)
    History:  Levon Boettcher is a 58 year old gentleman with a history of bladder dysfunction who also reports episodes of near syncope in the past.  The patient is being evaluated for this issue.  This is a routine EEG.  No skull defects are noted.  Medications include Tylenol, Flonase, Trileptal, and Myrbetriq.  EEG classification: Normal awake and drowsy  Description of the recording: The background rhythms of this recording consists of a fairly well modulated medium amplitude alpha rhythm of 9 Hz that is reactive to eye opening and closure. As the record progresses, the patient appears to remain in the waking state throughout the recording. Photic stimulation was performed, resulting in a bilateral and symmetric photic driving response. Hyperventilation was also performed, resulting in a minimal buildup of the background rhythm activities without significant slowing seen. Toward the end of the recording, the patient enters the drowsy state with slight symmetric slowing seen. The patient never enters stage II sleep. At no time during the recording does there appear to be evidence of spike or spike wave discharges or evidence of focal slowing. EKG monitor shows no evidence of cardiac rhythm abnormalities with a heart rate of 72.  Impression: This is a normal EEG recording in the waking and drowsy state. No evidence of ictal or interictal discharges are seen.

## 2019-02-18 NOTE — Telephone Encounter (Signed)
I called the patient.  The EEG study was normal.

## 2019-02-19 DIAGNOSIS — F3481 Disruptive mood dysregulation disorder: Secondary | ICD-10-CM | POA: Diagnosis not present

## 2019-03-14 ENCOUNTER — Encounter: Payer: Self-pay | Admitting: Sports Medicine

## 2019-03-14 ENCOUNTER — Other Ambulatory Visit: Payer: Self-pay

## 2019-03-14 ENCOUNTER — Ambulatory Visit (INDEPENDENT_AMBULATORY_CARE_PROVIDER_SITE_OTHER): Payer: Medicare HMO | Admitting: Sports Medicine

## 2019-03-14 VITALS — Temp 97.5°F | Resp 16

## 2019-03-14 DIAGNOSIS — L600 Ingrowing nail: Secondary | ICD-10-CM

## 2019-03-14 DIAGNOSIS — M79674 Pain in right toe(s): Secondary | ICD-10-CM

## 2019-03-14 MED ORDER — NEOMYCIN-POLYMYXIN-HC 3.5-10000-1 OT SOLN: OTIC | 0 refills | Status: DC

## 2019-03-14 MED ORDER — AMOXICILLIN-POT CLAVULANATE 875-125 MG PO TABS: 1.0000 | ORAL_TABLET | Freq: Two times a day (BID) | ORAL | 0 refills | Status: DC

## 2019-03-14 NOTE — Patient Instructions (Signed)

## 2019-03-14 NOTE — Progress Notes (Signed)
Subjective: KEM HENSEN is a 58 y.o. male patient presents to office today complaining of a moderately painful incurvated, red, hot, swollen medial nail border of the first toe on the right foot with a scrape secondary to bandaging. This has been present for a few days started out after he trimmed his toenail on Sunday and then woke up Monday morning with the toe swollen warm and red so he soaked with peroxide and applied Hansen incisor and a Band-Aid to the area and kept it wrapped for the subsequent 3 days and was afraid to look at it thought it was really bad states today is the first day that the dressing has been removed and does notice some drainage and admits that he thinks that his dressings may have also rubbed because he could feel some pain when he was in a shoe.  Denies nausea vomiting fever chills or any other constitutional symptoms at this time.  Patient Active Problem List   Diagnosis Date Noted  . Malnutrition of moderate degree 05/01/2018  . Cellulitis of right leg 04/28/2018  . Cellulitis 04/28/2018    Current Outpatient Medications on File Prior to Visit  Medication Sig Dispense Refill  . acetaminophen (TYLENOL) 500 MG tablet Take 500-2,000 mg by mouth every 4 (four) hours as needed (pain).     Marland Kitchen betamethasone dipropionate (DIPROLENE) 0.05 % cream Apply topically 2 (two) times daily. For 2 weeks 15 g 0  . fluticasone (FLONASE) 50 MCG/ACT nasal spray Place into both nostrils daily.    . fluvoxaMINE (LUVOX) 100 MG tablet Take three (3) tablets by mouth at bedtime    . mirabegron ER (MYRBETRIQ) 50 MG TB24 tablet     . Oxcarbazepine (TRILEPTAL) 300 MG tablet Take 300 mg by mouth daily.    Marland Kitchen triamcinolone cream (KENALOG) 0.5 % APPLY TO RASH TID     No current facility-administered medications on file prior to visit.     Allergies  Allergen Reactions  . Niacin Rash    Objective:  There were no vitals filed for this visit.  General: Well developed, nourished, in no  acute distress, alert and oriented x3   Dermatology: Skin is warm, dry and supple bilateral.  Right hallux nail appears to be  severely incurvated with hyperkeratosis formation at the distal aspects of  the medial nail border with abnormal shape from previous nail procedures in the past many years ago. (+) Erythema that extends around the entire proximal nail fold. (+) Edema. (+) Minimal serosanguous  drainage present likely from patient keeping bandage on several days without changing or soaking. The remaining nails appear unremarkable at this time. There are no open sores, lesions or other signs of infection  present.  Vascular: Dorsalis Pedis artery 2 out of 4 bilateral and Posterior Tibial artery pedal pulses are 1/4 bilateral with immedate capillary fill time. Pedal hair growth present. No lower extremity edema.   Neruologic: Grossly intact via light touch bilateral.  Musculoskeletal: Tenderness to palpation of the right hallux medial greater than proximal nail margin. Muscular strength within normal limits in all groups bilateral.   Assesement and Plan: Problem List Items Addressed This Visit    None    Visit Diagnoses    Ingrown toenail    -  Primary   Toe pain, right          -Discussed treatment alternatives and plan of care; Explained permanent/temporary nail avulsion and post procedure course to patient. Patient elects for PNA total nail right  hallux. - After a verbal and written consent, injected 3 ml of a 50:50 mixture of 2% plain  lidocaine and 0.5% plain marcaine in a normal hallux block fashion. Next, a  betadine prep was performed. Anesthesia was tested and found to be appropriate.  The offending right hallux nail in total was then incised from the hyponychium to the epinychium. The offending nail was removed and cleared from the field. The area was curretted for any remaining nail or spicules. Phenol application performed and the area was then flushed with alcohol and  dressed with antibiotic cream and a dry sterile dressing. -Rx Augmentin to take as instructed and Corticosporin drops -Patient was instructed to leave the dressing intact for today and begin soaking  in a weak solution of betadine or Epsom salt and water tomorrow. Patient was instructed to  soak for 15-20 minutes each day and apply corticosporin and a gauze or bandaid dressing each day. -Patient was instructed to monitor the toe for signs of infection and return to office if toe becomes red, hot or swollen. -Advised ice, elevation, and tylenol or motrin if needed for pain.  -Patient is to return in 2 weeks for follow up care/nail check or sooner if problems arise.  Landis Martins, DPM

## 2019-03-21 DIAGNOSIS — N3281 Overactive bladder: Secondary | ICD-10-CM | POA: Diagnosis not present

## 2019-03-28 ENCOUNTER — Encounter: Payer: Self-pay | Admitting: Sports Medicine

## 2019-03-28 ENCOUNTER — Ambulatory Visit (INDEPENDENT_AMBULATORY_CARE_PROVIDER_SITE_OTHER): Payer: Medicare HMO | Admitting: Sports Medicine

## 2019-03-28 ENCOUNTER — Other Ambulatory Visit: Payer: Self-pay

## 2019-03-28 VITALS — Temp 97.2°F | Resp 16

## 2019-03-28 DIAGNOSIS — Z9889 Other specified postprocedural states: Secondary | ICD-10-CM

## 2019-03-28 DIAGNOSIS — M79674 Pain in right toe(s): Secondary | ICD-10-CM

## 2019-03-28 NOTE — Progress Notes (Signed)
Subjective: Ryan [name] is a 58 y.o. male patient returns to office today for follow up evaluation after having Right hallux total permanent nail avulsion performed on (03-14-19). Patient has been soaking using soap and epsom salt and applying topical antibiotic drops covered with bandaid daily. 4/10 stinging pain with dressing changes. Finished Augmentin yesterday. Patient deniesfever/chills/nausea/vomitting/any other related constitutional symptoms at this time.  Patient Active Problem List   Diagnosis Date Noted  . Malnutrition of moderate degree 05/01/2018  . Cellulitis of right leg 04/28/2018  . Cellulitis 04/28/2018    Current Outpatient Medications on File Prior to Visit  Medication Sig Dispense Refill  . acetaminophen (TYLENOL) 500 MG tablet Take 500-2,000 mg by mouth every 4 (four) hours as needed (pain).     Marland Kitchen betamethasone dipropionate (DIPROLENE) 0.05 % cream Apply topically 2 (two) times daily. For 2 weeks 15 g 0  . fluticasone (FLONASE) 50 MCG/ACT nasal spray Place into both nostrils daily.    . fluvoxaMINE (LUVOX) 100 MG tablet Take three (3) tablets by mouth at bedtime    . mirabegron ER (MYRBETRIQ) 50 MG TB24 tablet     . neomycin-polymyxin-hydrocortisone (CORTISPORIN) OTIC solution Apply 2-3 drops to the ingrown toenail site twice daily. Cover with band-aid. 10 mL 0  . Oxcarbazepine (TRILEPTAL) 300 MG tablet Take 300 mg by mouth daily.    Marland Kitchen triamcinolone cream (KENALOG) 0.5 % APPLY TO RASH TID     No current facility-administered medications on file prior to visit.     Allergies  Allergen Reactions  . Niacin Rash    Objective:  General: Well developed, nourished, in no acute distress, alert and oriented x3   Dermatology: Skin is warm, dry and supple bilateral. Right hallux nail bed appears to be clean, dry, with mild fibrogranular tissue and surrounding maceration. (+) Focal blanchable Erythema. (+) Edema. (-) serosanguous drainage present. The remaining nails  appear unremarkable at this time. There are no other lesions or other signs of infection  present.  Neurovascular status: Intact. No lower extremity swelling; No pain with calf compression bilateral.  Musculoskeletal: There is tenderness to palpation of the Right hallux nail bed. Muscular strength within normal limits bilateral.   Assesement and Plan: Problem List Items Addressed This Visit    None    Visit Diagnoses    S/P nail surgery    -  Primary   Toe pain, right          -Examined patient  -Cleansed right hallux nail bed and gently scrubbed with peroxide and q-tip/curetted away fibrotic sloft at site and applied antibiotic cream covered with bandaid.  -Discussed plan of care with patient. -Patient to now begin soaking in a weak solution of betadine or Epsom salt and warm water. Patient was instructed to soak for 15-20 minutes each day until the toe appears normal and there is no drainage, redness, tenderness, or swelling at the procedure site, and apply corticosporin or neosporin and a gauze or bandaid dressing each day as needed. May leave open to air at night. No silk tape.  -Educated patient on long term care after nail surgery. -Patient was instructed to monitor the toe for reoccurrence and signs of infection; Patient advised to return to office or go to ER if toe becomes red, hot or swollen. -Patient is to return in 1 month for final toe check or sooner if problems arise.  Landis Martins, DPM

## 2019-03-28 NOTE — Patient Instructions (Signed)

## 2019-03-31 ENCOUNTER — Telehealth: Payer: Self-pay | Admitting: *Deleted

## 2019-03-31 MED ORDER — SULFAMETHOXAZOLE-TRIMETHOPRIM 400-80 MG PO TABS: 1.0000 | ORAL_TABLET | Freq: Two times a day (BID) | ORAL | 0 refills | Status: DC

## 2019-03-31 NOTE — Telephone Encounter (Signed)
Send bactrim 400/80 to his pharmacy I tab bid x 2 weeks and see if patient can send [place] a picture of what his toe looks like through mychart for me to compare what it looks like compared to his last visit Thanks Dr. Cannon Kettle

## 2019-03-31 NOTE — Telephone Encounter (Signed)
Patient called stating that he is having increased pain so much so that he took one of his pain pills.  Patient stated he is soaking in antibacterial soap water, I recommended he switch back to the epsom salt or to betadine soaks as these will help to pull out pain redness and infection and ibuprofen for the pain.  I also suggested using the Neosporin with pain relief patient stated he is using that and has been just tenting the band aid.  He states that he is having a yellowish pus like drainage around the entire toe.  Dr Cannon Kettle please advise.

## 2019-03-31 NOTE — Telephone Encounter (Signed)
Called and left message for patient with Dr Leeanne Rio request for a picture and notified that I was sending in the oral antibiotic.

## 2019-04-01 ENCOUNTER — Encounter: Payer: Self-pay | Admitting: Sports Medicine

## 2019-04-01 DIAGNOSIS — N3941 Urge incontinence: Secondary | ICD-10-CM | POA: Diagnosis not present

## 2019-04-01 DIAGNOSIS — R350 Frequency of micturition: Secondary | ICD-10-CM | POA: Diagnosis not present

## 2019-04-08 DIAGNOSIS — N3941 Urge incontinence: Secondary | ICD-10-CM | POA: Diagnosis not present

## 2019-04-11 DIAGNOSIS — J302 Other seasonal allergic rhinitis: Secondary | ICD-10-CM | POA: Diagnosis not present

## 2019-04-16 DIAGNOSIS — F332 Major depressive disorder, recurrent severe without psychotic features: Secondary | ICD-10-CM | POA: Diagnosis not present

## 2019-04-25 ENCOUNTER — Encounter: Payer: Self-pay | Admitting: Sports Medicine

## 2019-04-25 ENCOUNTER — Other Ambulatory Visit: Payer: Self-pay

## 2019-04-25 ENCOUNTER — Ambulatory Visit (INDEPENDENT_AMBULATORY_CARE_PROVIDER_SITE_OTHER): Payer: Medicare HMO | Admitting: Sports Medicine

## 2019-04-25 VITALS — Temp 97.5°F | Resp 16

## 2019-04-25 DIAGNOSIS — M79674 Pain in right toe(s): Secondary | ICD-10-CM | POA: Diagnosis not present

## 2019-04-25 DIAGNOSIS — Z9889 Other specified postprocedural states: Secondary | ICD-10-CM

## 2019-04-25 DIAGNOSIS — M2010 Hallux valgus (acquired), unspecified foot: Secondary | ICD-10-CM

## 2019-04-25 NOTE — Progress Notes (Signed)
Subjective: Ryan [name] is a 58 y.o. male patient returns to office today for follow up evaluation after having Right hallux total permanent nail avulsion performed on (03-14-19). Patient has been soaking using soap and epsom salt and applying topical antibiotic drops covered with bandaid daily. Repots that it looks like it is healing, the scab looks like a nail with less redness and no swelling or drainage. Patient deniesfever/chills/nausea/vomitting/any other related constitutional symptoms at this time.  Patient Active Problem List   Diagnosis Date Noted  . Malnutrition of moderate degree 05/01/2018  . Cellulitis of right leg 04/28/2018  . Cellulitis 04/28/2018    Current Outpatient Medications on File Prior to Visit  Medication Sig Dispense Refill  . acetaminophen (TYLENOL) 500 MG tablet Take 500-2,000 mg by mouth every 4 (four) hours as needed (pain).     Marland Kitchen betamethasone dipropionate (DIPROLENE) 0.05 % cream Apply topically 2 (two) times daily. For 2 weeks 15 g 0  . fluticasone (FLONASE) 50 MCG/ACT nasal spray Place into both nostrils daily.    . fluvoxaMINE (LUVOX) 100 MG tablet Take three (3) tablets by mouth at bedtime    . mirabegron ER (MYRBETRIQ) 50 MG TB24 tablet     . neomycin-polymyxin-hydrocortisone (CORTISPORIN) OTIC solution Apply 2-3 drops to the ingrown toenail site twice daily. Cover with band-aid. 10 mL 0  . Oxcarbazepine (TRILEPTAL) 300 MG tablet Take 300 mg by mouth daily.    Marland Kitchen sulfamethoxazole-trimethoprim (BACTRIM) 400-80 MG tablet Take 1 tablet by mouth 2 (two) times daily. 28 tablet 0  . triamcinolone cream (KENALOG) 0.5 % APPLY TO RASH TID     No current facility-administered medications on file prior to visit.     Allergies  Allergen Reactions  . Niacin Rash    Objective:  General: Well developed, nourished, in no acute distress, alert and oriented x3   Dermatology: Skin is warm, dry and supple bilateral. Right hallux nail bed appears to be clean, dry,  with scab. (-) Erythema. (-) Edema. (-) serosanguous drainage present. The remaining nails appear unremarkable at this time. There are no other lesions or other signs of infection present.  Neurovascular status: Intact. No lower extremity swelling; No pain with calf compression bilateral.  Musculoskeletal: There is minimal tenderness to palpation of the Right hallux nail bed. Muscular strength within normal limits bilateral. Mild hallux valgus.   Assesement and Plan: Problem List Items Addressed This Visit    None    Visit Diagnoses    S/P nail surgery    -  Primary   Toe pain, right       Acquired hallux valgus, unspecified laterality          -Examined patient  -Cleansed right hallux nail bed and gently scrubbed with peroxide and q-tip/curetted away loose scab and applied antibiotic cream and bandaid to the area -Dispensed toe spacer for valgus -Educated patient on long term care after nail surgery. -Patient may d/c soaking and antibiotic drops + bandaid if there is no soreness or drainage -Patient was instructed to monitor the toe for reoccurrence and signs of infection; Patient advised to return to office or go to ER if toe becomes red, hot or swollen. -Patient is to return in [place] sooner if problems arise.  Landis Martins, DPM

## 2019-04-29 DIAGNOSIS — H524 Presbyopia: Secondary | ICD-10-CM | POA: Diagnosis not present

## 2019-04-30 DIAGNOSIS — F332 Major depressive disorder, recurrent severe without psychotic features: Secondary | ICD-10-CM | POA: Diagnosis not present

## 2019-05-02 DIAGNOSIS — F332 Major depressive disorder, recurrent severe without psychotic features: Secondary | ICD-10-CM | POA: Diagnosis not present

## 2019-05-05 DIAGNOSIS — C44529 Squamous cell carcinoma of skin of other part of trunk: Secondary | ICD-10-CM | POA: Diagnosis not present

## 2019-05-05 DIAGNOSIS — I8312 Varicose veins of left lower extremity with inflammation: Secondary | ICD-10-CM | POA: Diagnosis not present

## 2019-05-05 DIAGNOSIS — L853 Xerosis cutis: Secondary | ICD-10-CM | POA: Diagnosis not present

## 2019-05-05 DIAGNOSIS — I8311 Varicose veins of right lower extremity with inflammation: Secondary | ICD-10-CM | POA: Diagnosis not present

## 2019-05-05 DIAGNOSIS — F332 Major depressive disorder, recurrent severe without psychotic features: Secondary | ICD-10-CM | POA: Diagnosis not present

## 2019-05-05 DIAGNOSIS — L300 Nummular dermatitis: Secondary | ICD-10-CM | POA: Diagnosis not present

## 2019-05-07 DIAGNOSIS — F332 Major depressive disorder, recurrent severe without psychotic features: Secondary | ICD-10-CM | POA: Diagnosis not present

## 2019-05-07 DIAGNOSIS — N3941 Urge incontinence: Secondary | ICD-10-CM | POA: Diagnosis not present

## 2019-05-08 DIAGNOSIS — F332 Major depressive disorder, recurrent severe without psychotic features: Secondary | ICD-10-CM | POA: Diagnosis not present

## 2019-05-12 ENCOUNTER — Encounter: Payer: Self-pay | Admitting: Sports Medicine

## 2019-05-12 DIAGNOSIS — F332 Major depressive disorder, recurrent severe without psychotic features: Secondary | ICD-10-CM | POA: Diagnosis not present

## 2019-05-13 ENCOUNTER — Other Ambulatory Visit: Payer: Self-pay | Admitting: Sports Medicine

## 2019-05-13 MED ORDER — NEOMYCIN-POLYMYXIN-HC 3.5-10000-1 OT SOLN: OTIC | 0 refills | Status: DC

## 2019-05-13 MED ORDER — SULFAMETHOXAZOLE-TRIMETHOPRIM 400-80 MG PO TABS: 1.0000 | ORAL_TABLET | Freq: Two times a day (BID) | ORAL | 0 refills | Status: DC

## 2019-05-13 NOTE — Progress Notes (Signed)
Refilled antibiotics due to recurrent redness and drainage to toe s/p nail procedure -Dr. Cannon Kettle

## 2019-05-13 NOTE — Telephone Encounter (Signed)
Patient came in because he sent a my chart message over the weekend and had not gotten a response.  Explained that Dr Cannon Kettle was in surgery on Monday and is in our [place] office today I am certain she just has not had the opportunity to respond yet.  I will forward his message with an urgent request and one of [place] will call him back.

## 2019-05-14 DIAGNOSIS — F332 Major depressive disorder, recurrent severe without psychotic features: Secondary | ICD-10-CM | POA: Diagnosis not present

## 2019-05-14 DIAGNOSIS — N3941 Urge incontinence: Secondary | ICD-10-CM | POA: Diagnosis not present

## 2019-05-19 DIAGNOSIS — F332 Major depressive disorder, recurrent severe without psychotic features: Secondary | ICD-10-CM | POA: Diagnosis not present

## 2019-05-21 DIAGNOSIS — I83812 Varicose veins of left lower extremities with pain: Secondary | ICD-10-CM | POA: Diagnosis not present

## 2019-05-21 DIAGNOSIS — I83811 Varicose veins of right lower extremities with pain: Secondary | ICD-10-CM | POA: Diagnosis not present

## 2019-05-21 DIAGNOSIS — I8311 Varicose veins of right lower extremity with inflammation: Secondary | ICD-10-CM | POA: Diagnosis not present

## 2019-05-21 DIAGNOSIS — F332 Major depressive disorder, recurrent severe without psychotic features: Secondary | ICD-10-CM | POA: Diagnosis not present

## 2019-05-21 DIAGNOSIS — I8312 Varicose veins of left lower extremity with inflammation: Secondary | ICD-10-CM | POA: Diagnosis not present

## 2019-05-21 DIAGNOSIS — I83813 Varicose veins of bilateral lower extremities with pain: Secondary | ICD-10-CM | POA: Diagnosis not present

## 2019-05-23 DIAGNOSIS — F332 Major depressive disorder, recurrent severe without psychotic features: Secondary | ICD-10-CM | POA: Diagnosis not present

## 2019-05-27 DIAGNOSIS — F332 Major depressive disorder, recurrent severe without psychotic features: Secondary | ICD-10-CM | POA: Diagnosis not present

## 2019-05-27 DIAGNOSIS — I83812 Varicose veins of left lower extremities with pain: Secondary | ICD-10-CM | POA: Diagnosis not present

## 2019-05-27 DIAGNOSIS — I8312 Varicose veins of left lower extremity with inflammation: Secondary | ICD-10-CM | POA: Diagnosis not present

## 2019-05-28 DIAGNOSIS — F332 Major depressive disorder, recurrent severe without psychotic features: Secondary | ICD-10-CM | POA: Diagnosis not present

## 2019-05-29 DIAGNOSIS — J302 Other seasonal allergic rhinitis: Secondary | ICD-10-CM | POA: Diagnosis not present

## 2019-05-29 DIAGNOSIS — F332 Major depressive disorder, recurrent severe without psychotic features: Secondary | ICD-10-CM | POA: Diagnosis not present

## 2019-05-30 DIAGNOSIS — F332 Major depressive disorder, recurrent severe without psychotic features: Secondary | ICD-10-CM | POA: Diagnosis not present

## 2019-06-05 DIAGNOSIS — F332 Major depressive disorder, recurrent severe without psychotic features: Secondary | ICD-10-CM | POA: Diagnosis not present

## 2019-06-09 DIAGNOSIS — F332 Major depressive disorder, recurrent severe without psychotic features: Secondary | ICD-10-CM | POA: Diagnosis not present

## 2019-06-11 DIAGNOSIS — F332 Major depressive disorder, recurrent severe without psychotic features: Secondary | ICD-10-CM | POA: Diagnosis not present

## 2019-06-12 DIAGNOSIS — F332 Major depressive disorder, recurrent severe without psychotic features: Secondary | ICD-10-CM | POA: Diagnosis not present

## 2019-06-13 DIAGNOSIS — F332 Major depressive disorder, recurrent severe without psychotic features: Secondary | ICD-10-CM | POA: Diagnosis not present

## 2019-06-16 DIAGNOSIS — Z23 Encounter for immunization: Secondary | ICD-10-CM | POA: Diagnosis not present

## 2019-06-16 DIAGNOSIS — F332 Major depressive disorder, recurrent severe without psychotic features: Secondary | ICD-10-CM | POA: Diagnosis not present

## 2019-06-19 DIAGNOSIS — F332 Major depressive disorder, recurrent severe without psychotic features: Secondary | ICD-10-CM | POA: Diagnosis not present

## 2019-06-20 DIAGNOSIS — F332 Major depressive disorder, recurrent severe without psychotic features: Secondary | ICD-10-CM | POA: Diagnosis not present

## 2019-06-20 DIAGNOSIS — I8312 Varicose veins of left lower extremity with inflammation: Secondary | ICD-10-CM | POA: Diagnosis not present

## 2019-06-23 DIAGNOSIS — F332 Major depressive disorder, recurrent severe without psychotic features: Secondary | ICD-10-CM | POA: Diagnosis not present

## 2019-06-27 DIAGNOSIS — F332 Major depressive disorder, recurrent severe without psychotic features: Secondary | ICD-10-CM | POA: Diagnosis not present

## 2019-06-30 DIAGNOSIS — F332 Major depressive disorder, recurrent severe without psychotic features: Secondary | ICD-10-CM | POA: Diagnosis not present

## 2019-07-04 ENCOUNTER — Ambulatory Visit (INDEPENDENT_AMBULATORY_CARE_PROVIDER_SITE_OTHER): Payer: Medicare HMO | Admitting: Sports Medicine

## 2019-07-04 ENCOUNTER — Encounter: Payer: Self-pay | Admitting: Sports Medicine

## 2019-07-04 ENCOUNTER — Other Ambulatory Visit: Payer: Self-pay

## 2019-07-04 VITALS — Temp 97.3°F | Resp 16

## 2019-07-04 DIAGNOSIS — J0100 Acute maxillary sinusitis, unspecified: Secondary | ICD-10-CM | POA: Diagnosis not present

## 2019-07-04 DIAGNOSIS — Z9889 Other specified postprocedural states: Secondary | ICD-10-CM

## 2019-07-04 DIAGNOSIS — L84 Corns and callosities: Secondary | ICD-10-CM

## 2019-07-04 DIAGNOSIS — L603 Nail dystrophy: Secondary | ICD-10-CM | POA: Diagnosis not present

## 2019-07-04 DIAGNOSIS — M79674 Pain in right toe(s): Secondary | ICD-10-CM

## 2019-07-04 DIAGNOSIS — H52209 Unspecified astigmatism, unspecified eye: Secondary | ICD-10-CM | POA: Diagnosis not present

## 2019-07-04 DIAGNOSIS — H5213 Myopia, bilateral: Secondary | ICD-10-CM | POA: Diagnosis not present

## 2019-07-04 NOTE — Progress Notes (Signed)
Subjective: Ryan [name] is a 58 y.o. male patient returns to office today for follow up evaluation after having Right hallux total permanent nail avulsion performed on (03-14-19). Patient reports that he has noticed a small piece/fragment of nail growing at the medial corner and want to have it checked states that it is not painful but he would like for it to be completely gone.  Patient reports pain has not done any treatment for this and has been closely watching it and keeping it out down.  Patient also states that he has a callus at the first toe and is wondering if I can trim that today as well. Patient deniesfever/chills/nausea/vomitting/any other related constitutional symptoms at this time.  Patient Active Problem List   Diagnosis Date Noted  . Malnutrition of moderate degree 05/01/2018  . Cellulitis of right leg 04/28/2018  . Cellulitis 04/28/2018    Current Outpatient Medications on File Prior to Visit  Medication Sig Dispense Refill  . fluticasone (FLONASE) 50 MCG/ACT nasal spray Place into both nostrils daily.    Marland Kitchen ketoconazole (NIZORAL) 2 % shampoo APPLY 5 TO 10 ML TOPICALLY TO WET HAIR TWICE WEEKLY FOR 4 WEEKS WITH AT LEAST 3 DAYS BETWEEN EACH SHAMPOOING    . mirabegron ER (MYRBETRIQ) 50 MG TB24 tablet      No current facility-administered medications on file prior to visit.     No Active Allergies  Objective:  General: Well developed, nourished, in no acute distress, alert and oriented x3   Dermatology: Skin is warm, dry and supple bilateral. Right hallux nail bed appears to be clean, dry, with scab versus a small nail particle at the medial nail fold. (-) Erythema. (-) Edema. (-) serosanguous drainage present. The remaining nails appear unremarkable at this time. There is a callus noted to the medial aspect of the right great toe with no signs of infection.  Neurovascular status: Intact. No lower extremity swelling; No pain with calf compression  bilateral.  Musculoskeletal: There no tenderness to palpation of the Right hallux nail bed. Muscular strength within normal limits bilateral. Mild hallux valgus.   Assesement and Plan: Problem List Items Addressed This Visit    None    Visit Diagnoses    Nail dystrophy    -  Primary   Callus       S/P nail surgery       Toe pain, right          -Examined patient  -Cleansed right hallux nail bed and gently scrubbed with peroxide and q-tip/curetted away loose scab and then use a nail nipper to remove the small fragments and applied a small drop of phenol medication then flushed the area with alcohol and then dressed the area with antibiotic cream and bandaid to the area -Advised patient to soak with Epson salt and warm water once a day for the next week and to use Corticosporin drops for the next week as well to make sure this area where I have removed the small scab/nail fragment -And no additional charge debrided the callus using a sterile chisel blade at the right first toe and advised patient that he will always get callusing due to his pronation and biomechanics.  Encourage patient to use O'Keefe healthy feet and pumice stone to slow the recurrence of the callus. -Continue with toe spacer for valgus -Continue with good supportive shoes that do not rub or irritate toes -Patient is to return in PRN or sooner if problems arise.  Landis Martins,  DPM

## 2019-07-24 DIAGNOSIS — N401 Enlarged prostate with lower urinary tract symptoms: Secondary | ICD-10-CM | POA: Diagnosis not present

## 2019-07-24 DIAGNOSIS — R350 Frequency of micturition: Secondary | ICD-10-CM | POA: Diagnosis not present

## 2019-07-24 DIAGNOSIS — N3941 Urge incontinence: Secondary | ICD-10-CM | POA: Diagnosis not present

## 2019-07-24 DIAGNOSIS — N3943 Post-void dribbling: Secondary | ICD-10-CM | POA: Diagnosis not present

## 2019-08-12 DIAGNOSIS — Z01812 Encounter for preprocedural laboratory examination: Secondary | ICD-10-CM | POA: Diagnosis not present

## 2019-08-12 DIAGNOSIS — R350 Frequency of micturition: Secondary | ICD-10-CM | POA: Diagnosis not present

## 2019-08-12 DIAGNOSIS — Z20828 Contact with and (suspected) exposure to other viral communicable diseases: Secondary | ICD-10-CM | POA: Diagnosis not present

## 2019-08-19 DIAGNOSIS — Z87891 Personal history of nicotine dependence: Secondary | ICD-10-CM | POA: Diagnosis not present

## 2019-08-19 DIAGNOSIS — N401 Enlarged prostate with lower urinary tract symptoms: Secondary | ICD-10-CM | POA: Diagnosis not present

## 2019-08-19 DIAGNOSIS — K219 Gastro-esophageal reflux disease without esophagitis: Secondary | ICD-10-CM | POA: Diagnosis not present

## 2019-08-19 DIAGNOSIS — N3943 Post-void dribbling: Secondary | ICD-10-CM | POA: Diagnosis not present

## 2019-08-19 DIAGNOSIS — N3941 Urge incontinence: Secondary | ICD-10-CM | POA: Diagnosis not present

## 2019-08-19 DIAGNOSIS — N3281 Overactive bladder: Secondary | ICD-10-CM | POA: Diagnosis not present

## 2019-08-19 DIAGNOSIS — R350 Frequency of micturition: Secondary | ICD-10-CM | POA: Diagnosis not present

## 2019-08-19 DIAGNOSIS — F419 Anxiety disorder, unspecified: Secondary | ICD-10-CM | POA: Diagnosis not present

## 2019-08-19 DIAGNOSIS — M5416 Radiculopathy, lumbar region: Secondary | ICD-10-CM | POA: Diagnosis not present

## 2019-08-19 DIAGNOSIS — Z87448 Personal history of other diseases of urinary system: Secondary | ICD-10-CM | POA: Diagnosis not present

## 2019-08-20 DIAGNOSIS — L910 Hypertrophic scar: Secondary | ICD-10-CM | POA: Diagnosis not present

## 2019-08-29 DIAGNOSIS — J0110 Acute frontal sinusitis, unspecified: Secondary | ICD-10-CM | POA: Diagnosis not present

## 2019-09-08 DIAGNOSIS — R339 Retention of urine, unspecified: Secondary | ICD-10-CM | POA: Diagnosis not present

## 2019-09-08 DIAGNOSIS — R32 Unspecified urinary incontinence: Secondary | ICD-10-CM | POA: Diagnosis not present

## 2019-09-08 DIAGNOSIS — Z87448 Personal history of other diseases of urinary system: Secondary | ICD-10-CM | POA: Diagnosis not present

## 2019-09-08 DIAGNOSIS — R350 Frequency of micturition: Secondary | ICD-10-CM | POA: Diagnosis not present

## 2019-09-17 DIAGNOSIS — F332 Major depressive disorder, recurrent severe without psychotic features: Secondary | ICD-10-CM | POA: Diagnosis not present

## 2019-09-17 DIAGNOSIS — N319 Neuromuscular dysfunction of bladder, unspecified: Secondary | ICD-10-CM | POA: Diagnosis not present

## 2019-09-17 DIAGNOSIS — R339 Retention of urine, unspecified: Secondary | ICD-10-CM | POA: Diagnosis not present

## 2019-09-25 DIAGNOSIS — L570 Actinic keratosis: Secondary | ICD-10-CM | POA: Diagnosis not present

## 2019-09-28 DIAGNOSIS — N390 Urinary tract infection, site not specified: Secondary | ICD-10-CM | POA: Diagnosis not present

## 2019-09-28 DIAGNOSIS — N399 Disorder of urinary system, unspecified: Secondary | ICD-10-CM | POA: Diagnosis not present

## 2019-09-29 ENCOUNTER — Encounter: Payer: Self-pay | Admitting: Sports Medicine

## 2019-10-02 DIAGNOSIS — Z79891 Long term (current) use of opiate analgesic: Secondary | ICD-10-CM | POA: Diagnosis not present

## 2019-10-02 DIAGNOSIS — I8311 Varicose veins of right lower extremity with inflammation: Secondary | ICD-10-CM | POA: Diagnosis not present

## 2019-10-02 DIAGNOSIS — F429 Obsessive-compulsive disorder, unspecified: Secondary | ICD-10-CM | POA: Diagnosis not present

## 2019-10-02 DIAGNOSIS — I8312 Varicose veins of left lower extremity with inflammation: Secondary | ICD-10-CM | POA: Diagnosis not present

## 2019-10-02 DIAGNOSIS — F331 Major depressive disorder, recurrent, moderate: Secondary | ICD-10-CM | POA: Diagnosis not present

## 2019-10-10 DIAGNOSIS — R300 Dysuria: Secondary | ICD-10-CM | POA: Diagnosis not present

## 2019-10-13 DIAGNOSIS — R32 Unspecified urinary incontinence: Secondary | ICD-10-CM | POA: Diagnosis not present

## 2019-10-13 DIAGNOSIS — M545 Low back pain: Secondary | ICD-10-CM | POA: Diagnosis not present

## 2019-10-22 DIAGNOSIS — Z20828 Contact with and (suspected) exposure to other viral communicable diseases: Secondary | ICD-10-CM | POA: Diagnosis not present

## 2019-10-22 DIAGNOSIS — R05 Cough: Secondary | ICD-10-CM | POA: Diagnosis not present

## 2019-10-22 DIAGNOSIS — J3489 Other specified disorders of nose and nasal sinuses: Secondary | ICD-10-CM | POA: Diagnosis not present

## 2019-10-22 DIAGNOSIS — J029 Acute pharyngitis, unspecified: Secondary | ICD-10-CM | POA: Diagnosis not present

## 2019-10-24 DIAGNOSIS — M545 Low back pain: Secondary | ICD-10-CM | POA: Diagnosis not present

## 2019-10-24 DIAGNOSIS — M533 Sacrococcygeal disorders, not elsewhere classified: Secondary | ICD-10-CM | POA: Diagnosis not present

## 2019-10-24 DIAGNOSIS — G8929 Other chronic pain: Secondary | ICD-10-CM | POA: Diagnosis not present

## 2019-10-29 DIAGNOSIS — N3000 Acute cystitis without hematuria: Secondary | ICD-10-CM | POA: Diagnosis not present

## 2019-10-31 DIAGNOSIS — R102 Pelvic and perineal pain: Secondary | ICD-10-CM | POA: Diagnosis not present

## 2019-10-31 DIAGNOSIS — R3989 Other symptoms and signs involving the genitourinary system: Secondary | ICD-10-CM | POA: Diagnosis not present

## 2019-10-31 DIAGNOSIS — R399 Unspecified symptoms and signs involving the genitourinary system: Secondary | ICD-10-CM | POA: Diagnosis not present

## 2019-10-31 DIAGNOSIS — N400 Enlarged prostate without lower urinary tract symptoms: Secondary | ICD-10-CM | POA: Diagnosis not present

## 2019-10-31 DIAGNOSIS — R310 Gross hematuria: Secondary | ICD-10-CM | POA: Diagnosis not present

## 2019-10-31 DIAGNOSIS — N390 Urinary tract infection, site not specified: Secondary | ICD-10-CM | POA: Diagnosis not present

## 2019-11-10 DIAGNOSIS — R8271 Bacteriuria: Secondary | ICD-10-CM | POA: Diagnosis not present

## 2019-11-10 DIAGNOSIS — R310 Gross hematuria: Secondary | ICD-10-CM | POA: Diagnosis not present

## 2019-11-10 DIAGNOSIS — R300 Dysuria: Secondary | ICD-10-CM | POA: Diagnosis not present

## 2019-11-10 DIAGNOSIS — R399 Unspecified symptoms and signs involving the genitourinary system: Secondary | ICD-10-CM | POA: Diagnosis not present

## 2019-11-12 DIAGNOSIS — M545 Low back pain: Secondary | ICD-10-CM | POA: Diagnosis not present

## 2019-11-18 ENCOUNTER — Encounter: Payer: Self-pay | Admitting: Sports Medicine

## 2019-11-19 DIAGNOSIS — F331 Major depressive disorder, recurrent, moderate: Secondary | ICD-10-CM | POA: Diagnosis not present

## 2019-11-19 DIAGNOSIS — F429 Obsessive-compulsive disorder, unspecified: Secondary | ICD-10-CM | POA: Diagnosis not present

## 2019-11-21 DIAGNOSIS — M545 Low back pain: Secondary | ICD-10-CM | POA: Diagnosis not present

## 2019-11-24 DIAGNOSIS — B009 Herpesviral infection, unspecified: Secondary | ICD-10-CM | POA: Diagnosis not present

## 2019-11-24 DIAGNOSIS — J329 Chronic sinusitis, unspecified: Secondary | ICD-10-CM | POA: Diagnosis not present

## 2019-11-24 DIAGNOSIS — R102 Pelvic and perineal pain: Secondary | ICD-10-CM | POA: Diagnosis not present

## 2019-11-26 DIAGNOSIS — M545 Low back pain: Secondary | ICD-10-CM | POA: Diagnosis not present

## 2019-11-27 ENCOUNTER — Ambulatory Visit (INDEPENDENT_AMBULATORY_CARE_PROVIDER_SITE_OTHER): Payer: Medicare HMO | Admitting: Sports Medicine

## 2019-11-27 ENCOUNTER — Other Ambulatory Visit: Payer: Self-pay

## 2019-11-27 ENCOUNTER — Encounter: Payer: Self-pay | Admitting: Sports Medicine

## 2019-11-27 DIAGNOSIS — L603 Nail dystrophy: Secondary | ICD-10-CM | POA: Diagnosis not present

## 2019-11-27 DIAGNOSIS — L84 Corns and callosities: Secondary | ICD-10-CM | POA: Diagnosis not present

## 2019-11-27 DIAGNOSIS — Z9889 Other specified postprocedural states: Secondary | ICD-10-CM | POA: Diagnosis not present

## 2019-11-27 DIAGNOSIS — M79674 Pain in right toe(s): Secondary | ICD-10-CM

## 2019-11-27 NOTE — Progress Notes (Signed)
Subjective: Ryan [name] is a 59 y.o. male patient returns to office today for follow up evaluation after having Right hallux total permanent nail avulsion performed on (03-14-19). Patient reports that he wanted to have the toe checked because there is a small crease with some dry skin and last week he pulled the dry skin and was unsure if it was going to get infected.  Patient reports that when he pulled the dry skin it did not bleed and did not turn red swell or drain and reports that there is no pain to the area but wanted to have it checked to make sure it was okay.  Patient deniesfever/chills/nausea/vomitting/any other related constitutional symptoms at this time.  Patient Active Problem List   Diagnosis Date Noted  . Malnutrition of moderate degree 05/01/2018  . Cellulitis of right leg 04/28/2018  . Cellulitis 04/28/2018    Current Outpatient Medications on File Prior to Visit  Medication Sig Dispense Refill  . celecoxib (CELEBREX) 200 MG capsule Take by mouth.    . fluticasone (FLONASE) 50 MCG/ACT nasal spray Place into both nostrils daily.    Marland Kitchen ketoconazole (NIZORAL) 2 % shampoo APPLY 5 TO 10 ML TOPICALLY TO WET HAIR TWICE WEEKLY FOR 4 WEEKS WITH AT LEAST 3 DAYS BETWEEN EACH SHAMPOOING    . mirabegron ER (MYRBETRIQ) 50 MG TB24 tablet      No current facility-administered medications on file prior to visit.    No Active Allergies  Objective:  General: Well developed, nourished, in no acute distress, alert and oriented x3   Dermatology: Skin is warm, dry and supple bilateral. Right hallux nail bed appears to be clean, dry, with callus at the medial nail fold versus a small nail particle at the medial nail fold. (-) Erythema. (-) Edema. (-) serosanguous drainage present. The remaining nails appear unremarkable at this time. There is a callus noted to the medial aspect of the right great toe with no signs of infection.  Neurovascular status: Intact. No lower extremity swelling; No pain  with calf compression bilateral.  Musculoskeletal: There no tenderness to palpation of the Right hallux nail bed. Muscular strength within normal limits bilateral. Mild hallux valgus.   Assesement and Plan: Problem List Items Addressed This Visit    None    Visit Diagnoses    Nail dystrophy    -  Primary   Callus       S/P nail surgery       Toe pain, right          -Examined patient  -Cleansed right hallux nail bed and using a tissue nipper keratotic skin was removed from the nail fold there is no sign for regrowth of nail however at the nail fold there is a small crease that tends to collect type callus tissue as well as callus noted at the medial aspect of the right great toe which was mechanically debrided and no additional charge at this visit using a sterile chisel blade. -Advised patient to use Vaseline or skin softening cream over the callus areas at the right great toe; dispensed a sample of foot miracle cream to use to the areas -Continue with toe spacer for valgus -Continue with good supportive shoes that do not rub or irritate toes recommend new balance or Brooks to control excessive pronation -Patient is to return in PRN or sooner if problems arise.  Landis Martins, DPM

## 2019-12-02 DIAGNOSIS — N200 Calculus of kidney: Secondary | ICD-10-CM | POA: Diagnosis not present

## 2019-12-02 DIAGNOSIS — R1032 Left lower quadrant pain: Secondary | ICD-10-CM | POA: Diagnosis not present

## 2019-12-03 DIAGNOSIS — R399 Unspecified symptoms and signs involving the genitourinary system: Secondary | ICD-10-CM | POA: Diagnosis not present

## 2019-12-03 DIAGNOSIS — R350 Frequency of micturition: Secondary | ICD-10-CM | POA: Diagnosis not present

## 2019-12-03 DIAGNOSIS — N3281 Overactive bladder: Secondary | ICD-10-CM | POA: Diagnosis not present

## 2019-12-03 DIAGNOSIS — N3941 Urge incontinence: Secondary | ICD-10-CM | POA: Diagnosis not present

## 2019-12-03 DIAGNOSIS — N401 Enlarged prostate with lower urinary tract symptoms: Secondary | ICD-10-CM | POA: Diagnosis not present

## 2019-12-04 DIAGNOSIS — M545 Low back pain: Secondary | ICD-10-CM | POA: Diagnosis not present

## 2019-12-06 ENCOUNTER — Other Ambulatory Visit: Payer: Self-pay | Admitting: Student

## 2019-12-09 DIAGNOSIS — R109 Unspecified abdominal pain: Secondary | ICD-10-CM | POA: Diagnosis not present

## 2019-12-09 DIAGNOSIS — I878 Other specified disorders of veins: Secondary | ICD-10-CM | POA: Diagnosis not present

## 2019-12-09 DIAGNOSIS — R198 Other specified symptoms and signs involving the digestive system and abdomen: Secondary | ICD-10-CM | POA: Diagnosis not present

## 2019-12-09 DIAGNOSIS — M47816 Spondylosis without myelopathy or radiculopathy, lumbar region: Secondary | ICD-10-CM | POA: Diagnosis not present

## 2019-12-10 DIAGNOSIS — R109 Unspecified abdominal pain: Secondary | ICD-10-CM | POA: Insufficient documentation

## 2019-12-12 DIAGNOSIS — R300 Dysuria: Secondary | ICD-10-CM | POA: Diagnosis not present

## 2019-12-12 DIAGNOSIS — Z20822 Contact with and (suspected) exposure to covid-19: Secondary | ICD-10-CM | POA: Diagnosis not present

## 2019-12-12 DIAGNOSIS — Z01812 Encounter for preprocedural laboratory examination: Secondary | ICD-10-CM | POA: Diagnosis not present

## 2019-12-12 DIAGNOSIS — R0981 Nasal congestion: Secondary | ICD-10-CM | POA: Diagnosis not present

## 2019-12-12 DIAGNOSIS — R109 Unspecified abdominal pain: Secondary | ICD-10-CM | POA: Diagnosis not present

## 2019-12-19 DIAGNOSIS — R350 Frequency of micturition: Secondary | ICD-10-CM | POA: Diagnosis not present

## 2019-12-19 DIAGNOSIS — R3915 Urgency of urination: Secondary | ICD-10-CM | POA: Diagnosis not present

## 2019-12-19 DIAGNOSIS — N3281 Overactive bladder: Secondary | ICD-10-CM | POA: Diagnosis not present

## 2019-12-20 DIAGNOSIS — M5126 Other intervertebral disc displacement, lumbar region: Secondary | ICD-10-CM | POA: Diagnosis not present

## 2020-01-06 DIAGNOSIS — M6289 Other specified disorders of muscle: Secondary | ICD-10-CM | POA: Diagnosis not present

## 2020-01-06 DIAGNOSIS — J019 Acute sinusitis, unspecified: Secondary | ICD-10-CM | POA: Diagnosis not present

## 2020-01-06 DIAGNOSIS — R102 Pelvic and perineal pain: Secondary | ICD-10-CM | POA: Diagnosis not present

## 2020-01-06 DIAGNOSIS — Z20822 Contact with and (suspected) exposure to covid-19: Secondary | ICD-10-CM | POA: Diagnosis not present

## 2020-01-06 DIAGNOSIS — N3941 Urge incontinence: Secondary | ICD-10-CM | POA: Diagnosis not present

## 2020-01-06 DIAGNOSIS — R300 Dysuria: Secondary | ICD-10-CM | POA: Diagnosis not present

## 2020-01-06 DIAGNOSIS — R350 Frequency of micturition: Secondary | ICD-10-CM | POA: Diagnosis not present

## 2020-01-13 DIAGNOSIS — J329 Chronic sinusitis, unspecified: Secondary | ICD-10-CM | POA: Diagnosis not present

## 2020-01-13 DIAGNOSIS — J309 Allergic rhinitis, unspecified: Secondary | ICD-10-CM | POA: Diagnosis not present

## 2020-01-15 DIAGNOSIS — F429 Obsessive-compulsive disorder, unspecified: Secondary | ICD-10-CM | POA: Diagnosis not present

## 2020-01-19 DIAGNOSIS — N3941 Urge incontinence: Secondary | ICD-10-CM | POA: Diagnosis not present

## 2020-01-19 DIAGNOSIS — R102 Pelvic and perineal pain: Secondary | ICD-10-CM | POA: Diagnosis not present

## 2020-01-19 DIAGNOSIS — R350 Frequency of micturition: Secondary | ICD-10-CM | POA: Diagnosis not present

## 2020-01-19 DIAGNOSIS — M6289 Other specified disorders of muscle: Secondary | ICD-10-CM | POA: Diagnosis not present

## 2020-01-21 ENCOUNTER — Telehealth: Payer: Self-pay | Admitting: Neurology

## 2020-01-21 NOTE — Telephone Encounter (Signed)
Pt has called wanting an appointment with Dr Jannifer Franklin so his CP of his MRI could be read by him.  When pt was given Dr Tobey Grim 1st available (06-24) he declined and is asking the request be submitted that he be changed over to another Dr with more availability. Pt declined the appointment being scheduled, pt declined the wait list.  Pt is aware that once a decision has been made he will be called

## 2020-01-21 NOTE — Telephone Encounter (Signed)
I called pt about wanting to be seen for his back issues. Pt was seen 2019 by Dr.Willis for back issues. Pt stated he saw his urologist and they order a MRi lumbar spine and was told he had some degenerative changes and possible spinal stenosis.The urologist recommend he see Dr .Jannifer Franklin for this.He was told years ago to get a epidural shot or do exercises.He chose to do pelvic floor exercise for his bladder.PT had MR lumbar spine that is in careeverywhere dated 12/2019.I stated message will be sent to Dr. Jannifer Franklin for review . Pt verbalized understanding.

## 2020-01-22 NOTE — Telephone Encounter (Signed)
I called pt about Dr. Jannifer Franklin message. Pt stated he does not want to do injection in the back because of possible bone density issues and risk for infection.He has already called Dr. Arnoldo Morale neurosurgery for more advice and will be seeing him for an appt.  He will be giving Dr. Arnoldo Morale the CD. He appreciate the call and will call us back if he needs anything.

## 2020-01-22 NOTE — Telephone Encounter (Signed)
The patient was previously seen for a neurogenic bladder, the entire neuraxis has been scanned, the source of the neurogenic bladder is not apparent, MRI done recently the lumbar spine shows potential for left L3 and L4 nerve root compression.  This would not be a source of bladder dysfunction.  There is no clear evidence of significant spinal stenosis.  Spinal stenosis would have to be quite severe to impact the function of the bladder.  I will be happy to see the patient in follow-up, not sure that this is urgent.  I called patient, left a message, if he wishes to have an epidural steroid injection and he is having significant sciatica pain down the left leg, I will be happy to get this set up.  In terms of a revisit, I will be happy to see him but he needs to decide what he wants to do, the options are limited.    MRI lumbar 12/22/19:  Result Impression  :  1. Interval development of moderate inferiorly dissecting left eccentric central and left subarticular L3-L4 disc extrusion has resulted in interval development of severe effacement of the left subarticular recess and progression of mild left eccentric spinal canal stenosis at this level with significant mass effect on the descending left L4 nerve root.  2. Multilevel mild-to-moderate lumbar spondylosis contributing to multilevel mild-to-moderate neural foraminal stenoses and effacement of subarticular recesses with mild acquired spinal canal stenosis at L4-L5 otherwise similar to prior detailed above.

## 2020-01-22 NOTE — Telephone Encounter (Signed)
Pt returned call. Please call back when available. Pt stated he was not clear about the appointment situation. Please advise.

## 2020-01-23 DIAGNOSIS — H524 Presbyopia: Secondary | ICD-10-CM | POA: Diagnosis not present

## 2020-01-23 DIAGNOSIS — H52209 Unspecified astigmatism, unspecified eye: Secondary | ICD-10-CM | POA: Diagnosis not present

## 2020-01-23 DIAGNOSIS — H5213 Myopia, bilateral: Secondary | ICD-10-CM | POA: Diagnosis not present

## 2020-01-27 DIAGNOSIS — M6289 Other specified disorders of muscle: Secondary | ICD-10-CM | POA: Diagnosis not present

## 2020-01-27 DIAGNOSIS — R350 Frequency of micturition: Secondary | ICD-10-CM | POA: Diagnosis not present

## 2020-01-27 DIAGNOSIS — R102 Pelvic and perineal pain: Secondary | ICD-10-CM | POA: Diagnosis not present

## 2020-01-27 DIAGNOSIS — N3941 Urge incontinence: Secondary | ICD-10-CM | POA: Diagnosis not present

## 2020-02-02 DIAGNOSIS — N400 Enlarged prostate without lower urinary tract symptoms: Secondary | ICD-10-CM | POA: Diagnosis not present

## 2020-02-02 DIAGNOSIS — J069 Acute upper respiratory infection, unspecified: Secondary | ICD-10-CM | POA: Diagnosis not present

## 2020-02-02 DIAGNOSIS — F411 Generalized anxiety disorder: Secondary | ICD-10-CM | POA: Diagnosis not present

## 2020-02-02 DIAGNOSIS — Z0000 Encounter for general adult medical examination without abnormal findings: Secondary | ICD-10-CM | POA: Diagnosis not present

## 2020-02-02 DIAGNOSIS — E7800 Pure hypercholesterolemia, unspecified: Secondary | ICD-10-CM | POA: Diagnosis not present

## 2020-02-02 DIAGNOSIS — F329 Major depressive disorder, single episode, unspecified: Secondary | ICD-10-CM | POA: Diagnosis not present

## 2020-02-02 DIAGNOSIS — Z1389 Encounter for screening for other disorder: Secondary | ICD-10-CM | POA: Diagnosis not present

## 2020-02-02 DIAGNOSIS — K219 Gastro-esophageal reflux disease without esophagitis: Secondary | ICD-10-CM | POA: Diagnosis not present

## 2020-02-02 DIAGNOSIS — F419 Anxiety disorder, unspecified: Secondary | ICD-10-CM | POA: Diagnosis not present

## 2020-02-03 DIAGNOSIS — M6289 Other specified disorders of muscle: Secondary | ICD-10-CM | POA: Diagnosis not present

## 2020-02-03 DIAGNOSIS — R102 Pelvic and perineal pain: Secondary | ICD-10-CM | POA: Diagnosis not present

## 2020-02-03 DIAGNOSIS — R350 Frequency of micturition: Secondary | ICD-10-CM | POA: Diagnosis not present

## 2020-02-03 DIAGNOSIS — N3941 Urge incontinence: Secondary | ICD-10-CM | POA: Diagnosis not present

## 2020-02-04 DIAGNOSIS — N400 Enlarged prostate without lower urinary tract symptoms: Secondary | ICD-10-CM | POA: Diagnosis not present

## 2020-02-04 DIAGNOSIS — G894 Chronic pain syndrome: Secondary | ICD-10-CM | POA: Diagnosis not present

## 2020-02-04 DIAGNOSIS — F419 Anxiety disorder, unspecified: Secondary | ICD-10-CM | POA: Diagnosis not present

## 2020-02-04 DIAGNOSIS — Z0000 Encounter for general adult medical examination without abnormal findings: Secondary | ICD-10-CM | POA: Diagnosis not present

## 2020-02-04 DIAGNOSIS — F411 Generalized anxiety disorder: Secondary | ICD-10-CM | POA: Diagnosis not present

## 2020-02-04 DIAGNOSIS — E7800 Pure hypercholesterolemia, unspecified: Secondary | ICD-10-CM | POA: Diagnosis not present

## 2020-02-10 DIAGNOSIS — M545 Low back pain: Secondary | ICD-10-CM | POA: Diagnosis not present

## 2020-02-12 ENCOUNTER — Other Ambulatory Visit: Payer: Self-pay | Admitting: Neurosurgery

## 2020-02-12 ENCOUNTER — Telehealth: Payer: Self-pay | Admitting: Nurse Practitioner

## 2020-02-12 DIAGNOSIS — M545 Low back pain, unspecified: Secondary | ICD-10-CM

## 2020-02-12 DIAGNOSIS — G8929 Other chronic pain: Secondary | ICD-10-CM

## 2020-02-12 NOTE — Telephone Encounter (Signed)
Phone call to patient to verify medication list and allergies for myelogram procedure. Pt instructed to hold Luvox for 48hrs prior to myelogram appointment time. Pt verbalized understanding. Pre and post procedure instructions reviewed with pt.

## 2020-02-18 DIAGNOSIS — R350 Frequency of micturition: Secondary | ICD-10-CM | POA: Diagnosis not present

## 2020-02-18 DIAGNOSIS — N3941 Urge incontinence: Secondary | ICD-10-CM | POA: Diagnosis not present

## 2020-02-18 DIAGNOSIS — M6289 Other specified disorders of muscle: Secondary | ICD-10-CM | POA: Diagnosis not present

## 2020-02-18 DIAGNOSIS — R102 Pelvic and perineal pain: Secondary | ICD-10-CM | POA: Diagnosis not present

## 2020-02-19 NOTE — Discharge Instructions (Signed)
Myelogram Discharge Instructions  1. Go home and rest quietly for the next 24 hours.  It is important to lie flat for the next 24 hours.  Get up only to go to the restroom.  You may lie in the bed or on a couch on your back, your stomach, your left side or your right side.  You may have one pillow under your head.  You may have pillows between your knees while you are on your side or under your knees while you are on your back.  2. DO NOT drive today.  Recline the seat as far back as it will go, while still wearing your seat belt, on the way home.  3. You may get up to go to the bathroom as needed.  You may sit up for 10 minutes to eat.  You may resume your normal diet and medications unless otherwise indicated.  Drink lots of extra fluids today and tomorrow.  4. The incidence of headache, nausea, or vomiting is about 5% (one in 20 patients).  If you develop a headache, lie flat and drink plenty of fluids until the headache goes away.  Caffeinated beverages may be helpful.  If you develop severe nausea and vomiting or a headache that does not go away with flat bed rest, call [phone].  5. You may resume normal activities after your 24 hours of bed rest is over; however, do not exert yourself strongly or do any heavy lifting tomorrow. If when you get up you have a headache when standing, go back to bed and force fluids for another 24 hours.  6. Call your physician for a follow-up appointment.  The results of your myelogram will be sent directly to your physician by the following day.  7. If you have any questions or if complications develop after you arrive home, please call [phone].  Discharge instructions have been explained to the patient.  The patient, or the person responsible for the patient, fully understands these instructions.  YOU MAY RESTART YOUR LUVOX TOMORROW 02/21/20 AT 09:30AM.

## 2020-02-20 ENCOUNTER — Ambulatory Visit
Admission: RE | Admit: 2020-02-20 | Discharge: 2020-02-20 | Disposition: A | Discharge status: Home / Self Care | Payer: Medicare HMO | Source: Ambulatory Visit | Attending: Neurosurgery | Admitting: Neurosurgery

## 2020-02-20 ENCOUNTER — Other Ambulatory Visit: Payer: Self-pay

## 2020-02-20 DIAGNOSIS — G8929 Other chronic pain: Secondary | ICD-10-CM

## 2020-02-20 DIAGNOSIS — M5126 Other intervertebral disc displacement, lumbar region: Secondary | ICD-10-CM | POA: Diagnosis not present

## 2020-02-20 DIAGNOSIS — M48061 Spinal stenosis, lumbar region without neurogenic claudication: Secondary | ICD-10-CM | POA: Diagnosis not present

## 2020-02-20 DIAGNOSIS — M545 Low back pain, unspecified: Secondary | ICD-10-CM

## 2020-02-20 MED ORDER — IOPAMIDOL (ISOVUE-M 200) INJECTION 41%
18.0000 mL | Freq: Once | INTRAMUSCULAR | Status: AC
  Administered 2020-02-20: 18 mL via INTRATHECAL

## 2020-02-20 MED ORDER — DIAZEPAM 5 MG PO TABS
10.0000 mg | ORAL_TABLET | Freq: Once | ORAL | Status: AC
  Administered 2020-02-20: 5 mg via ORAL

## 2020-02-20 NOTE — Progress Notes (Signed)
Pt has been off the luvox for at least 48hrs for his myelogram procedure today.

## 2020-02-23 DIAGNOSIS — M545 Low back pain: Secondary | ICD-10-CM | POA: Diagnosis not present

## 2020-02-25 DIAGNOSIS — N3941 Urge incontinence: Secondary | ICD-10-CM | POA: Diagnosis not present

## 2020-02-25 DIAGNOSIS — R102 Pelvic and perineal pain: Secondary | ICD-10-CM | POA: Diagnosis not present

## 2020-02-25 DIAGNOSIS — M6289 Other specified disorders of muscle: Secondary | ICD-10-CM | POA: Diagnosis not present

## 2020-02-25 DIAGNOSIS — R350 Frequency of micturition: Secondary | ICD-10-CM | POA: Diagnosis not present

## 2020-03-01 DIAGNOSIS — N3941 Urge incontinence: Secondary | ICD-10-CM | POA: Diagnosis not present

## 2020-03-01 DIAGNOSIS — R102 Pelvic and perineal pain: Secondary | ICD-10-CM | POA: Diagnosis not present

## 2020-03-01 DIAGNOSIS — M6289 Other specified disorders of muscle: Secondary | ICD-10-CM | POA: Diagnosis not present

## 2020-03-01 DIAGNOSIS — R350 Frequency of micturition: Secondary | ICD-10-CM | POA: Diagnosis not present

## 2020-03-02 DIAGNOSIS — I83812 Varicose veins of left lower extremities with pain: Secondary | ICD-10-CM | POA: Diagnosis not present

## 2020-03-17 DIAGNOSIS — I8312 Varicose veins of left lower extremity with inflammation: Secondary | ICD-10-CM | POA: Diagnosis not present

## 2020-03-17 DIAGNOSIS — I83812 Varicose veins of left lower extremities with pain: Secondary | ICD-10-CM | POA: Diagnosis not present

## 2020-03-23 DIAGNOSIS — R350 Frequency of micturition: Secondary | ICD-10-CM | POA: Diagnosis not present

## 2020-03-23 DIAGNOSIS — M6289 Other specified disorders of muscle: Secondary | ICD-10-CM | POA: Diagnosis not present

## 2020-03-23 DIAGNOSIS — M533 Sacrococcygeal disorders, not elsewhere classified: Secondary | ICD-10-CM | POA: Diagnosis not present

## 2020-03-23 DIAGNOSIS — R102 Pelvic and perineal pain: Secondary | ICD-10-CM | POA: Diagnosis not present

## 2020-03-23 DIAGNOSIS — N3941 Urge incontinence: Secondary | ICD-10-CM | POA: Diagnosis not present

## 2020-03-23 DIAGNOSIS — N3943 Post-void dribbling: Secondary | ICD-10-CM | POA: Diagnosis not present

## 2020-03-30 DIAGNOSIS — I8311 Varicose veins of right lower extremity with inflammation: Secondary | ICD-10-CM | POA: Diagnosis not present

## 2020-03-30 DIAGNOSIS — M79651 Pain in right thigh: Secondary | ICD-10-CM | POA: Diagnosis not present

## 2020-03-30 DIAGNOSIS — I8312 Varicose veins of left lower extremity with inflammation: Secondary | ICD-10-CM | POA: Diagnosis not present

## 2020-03-30 DIAGNOSIS — M79604 Pain in right leg: Secondary | ICD-10-CM | POA: Diagnosis not present

## 2020-04-01 DIAGNOSIS — M461 Sacroiliitis, not elsewhere classified: Secondary | ICD-10-CM | POA: Diagnosis not present

## 2020-04-01 DIAGNOSIS — M48061 Spinal stenosis, lumbar region without neurogenic claudication: Secondary | ICD-10-CM | POA: Diagnosis not present

## 2020-04-12 IMAGING — CR DG ANKLE COMPLETE 3+V*R*
3 series · 3 of 3 positions shown · non-contrast
Comparison: None.

CLINICAL DATA: Possible insect bite yesterday. Patient looks to
have an infection on the dorsal side of right foot. Streaking going
up right leg. Tender to the touch.

EXAM:
RIGHT ANKLE - COMPLETE 3+ VIEW

[ankle ap]
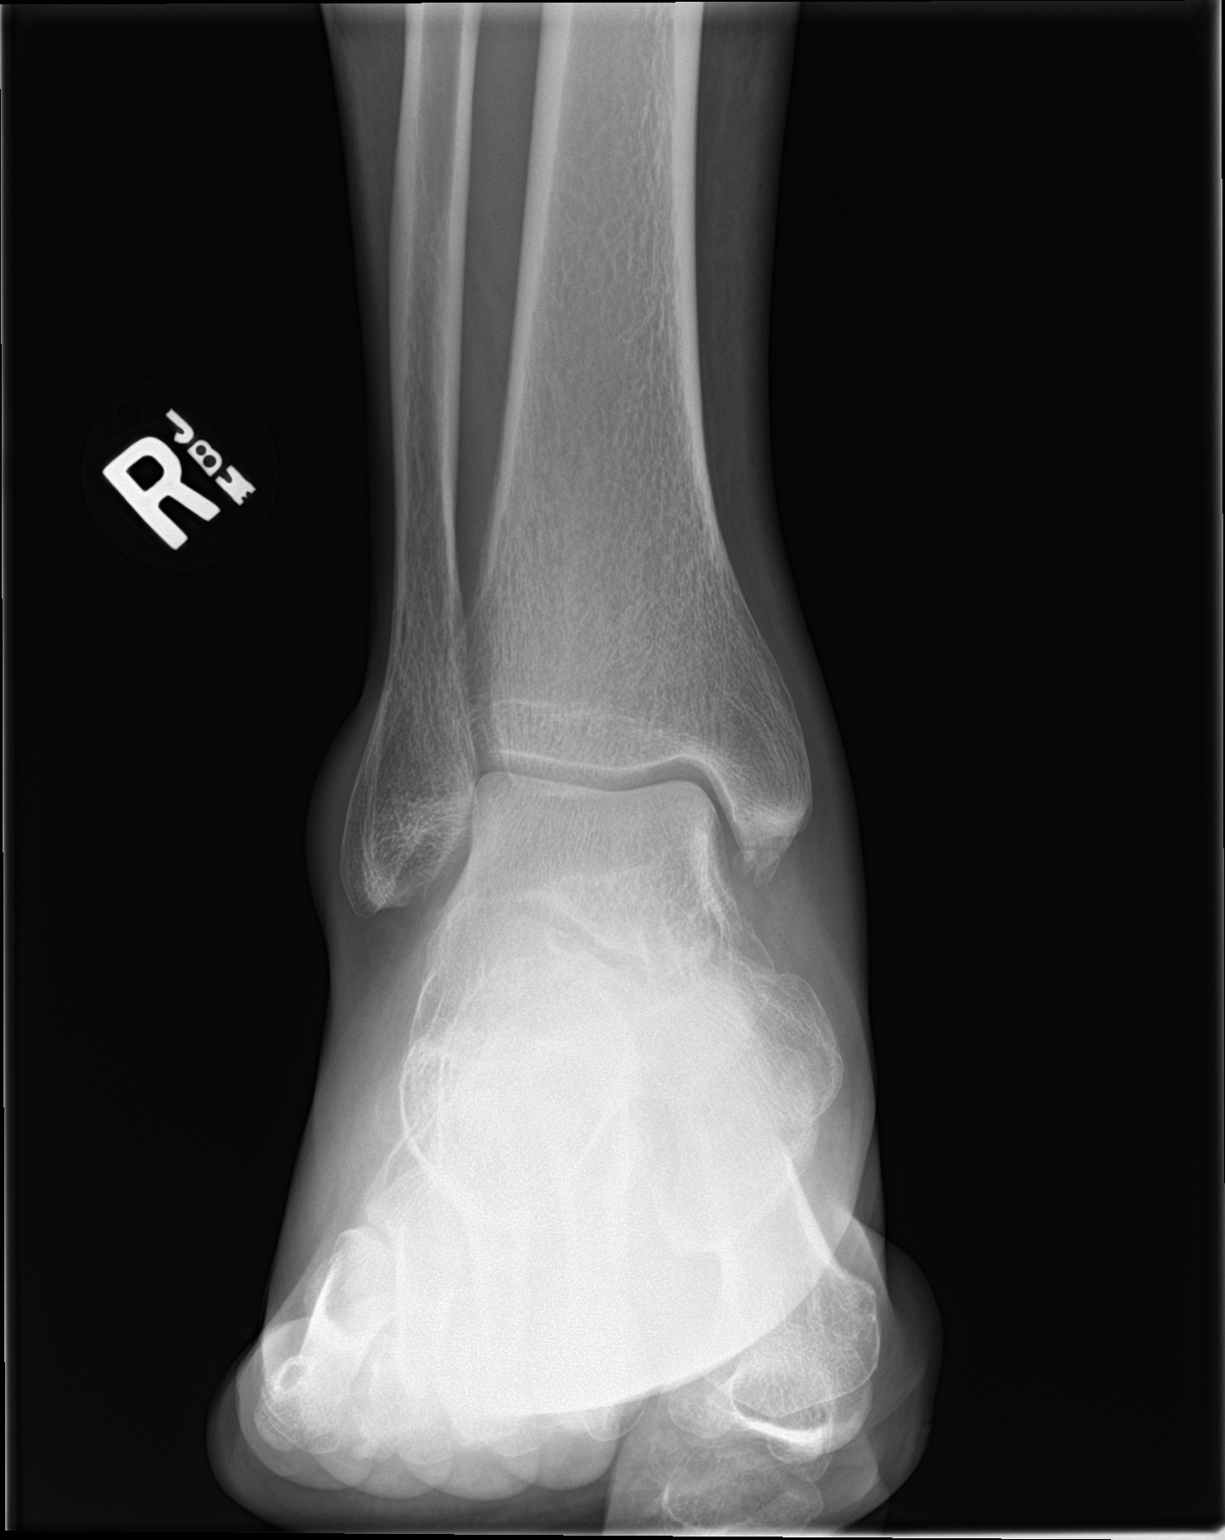

[ankle obl]
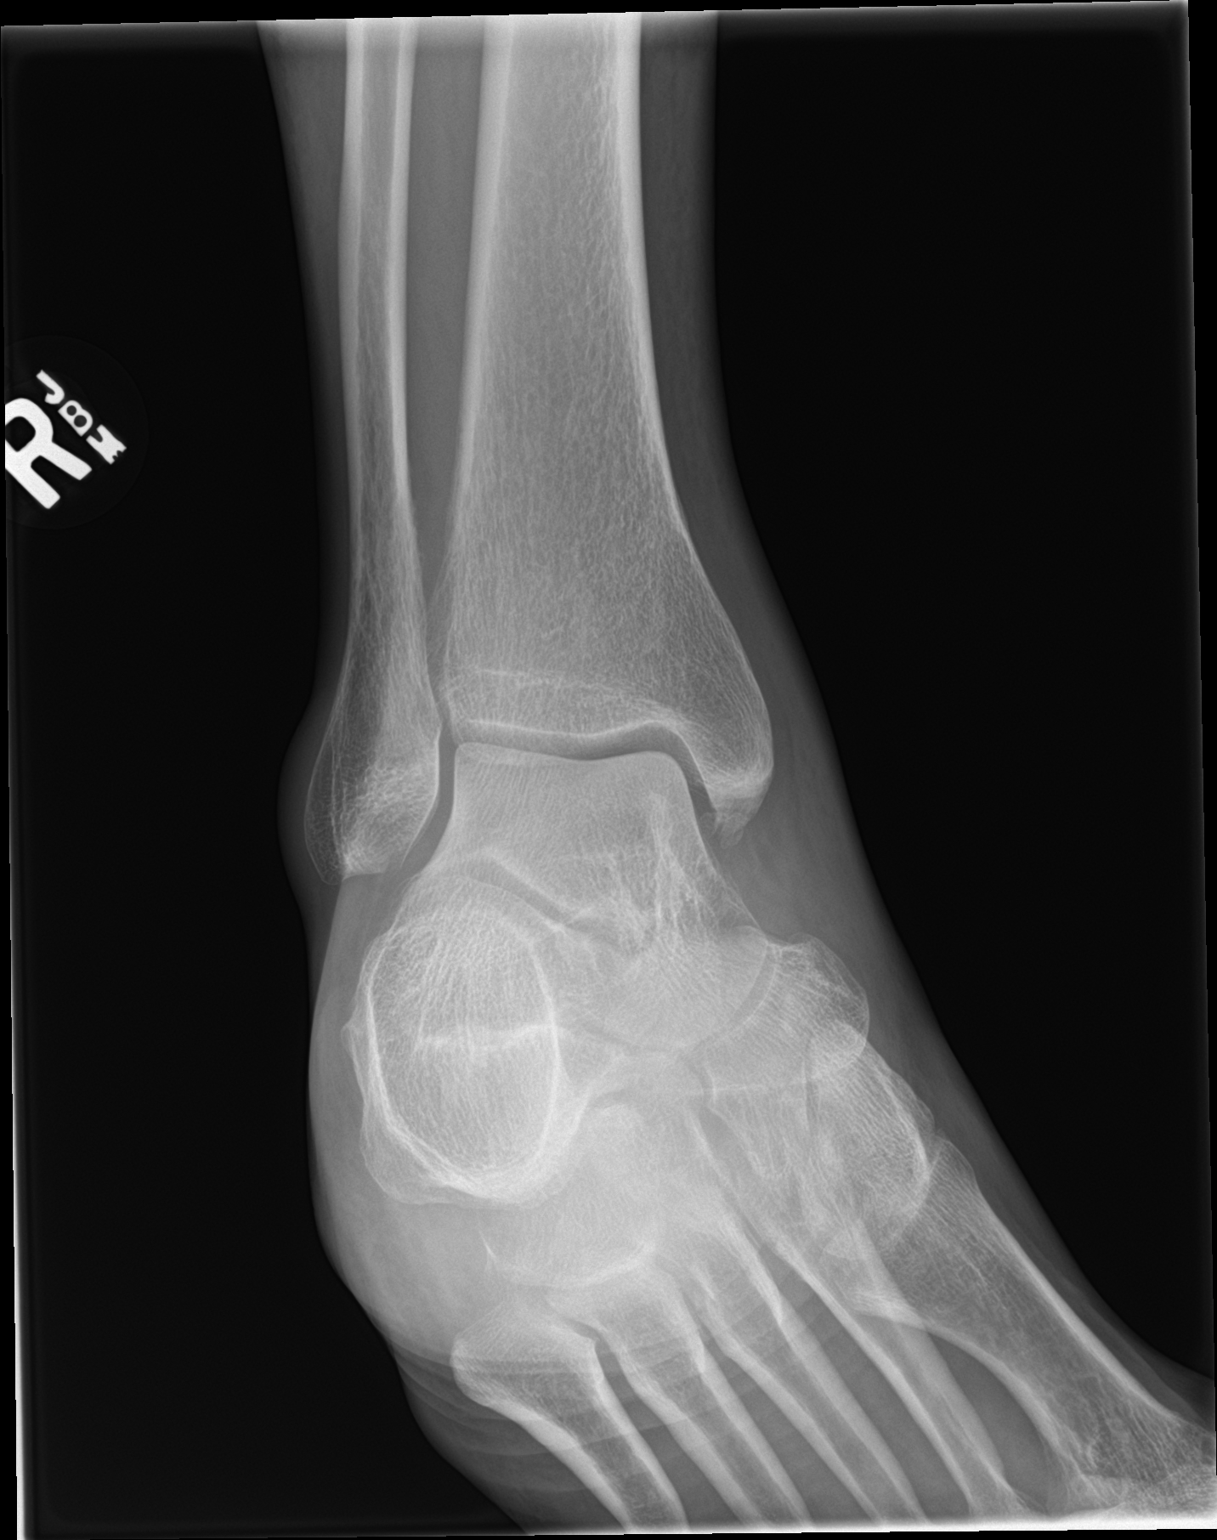

[ankle lat]
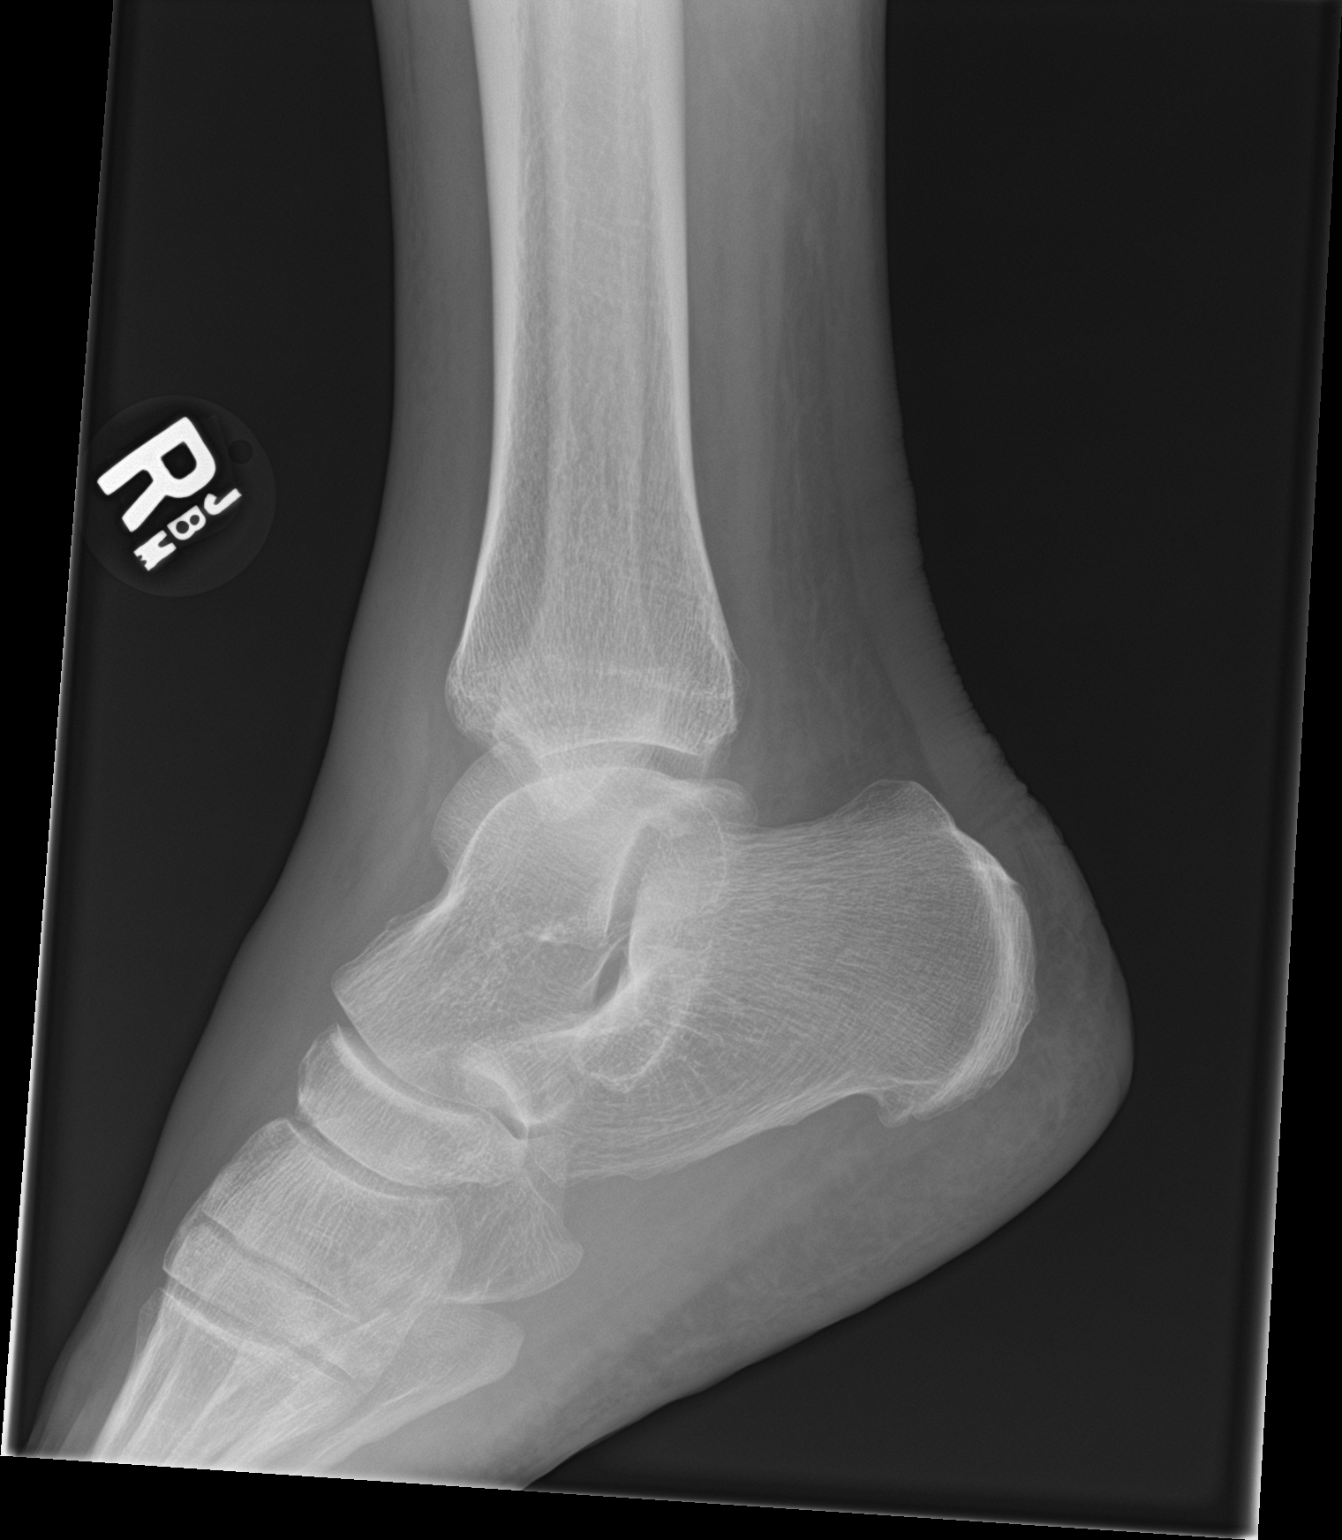

[3 of 3 positions shown; findings below may reference images not displayed]

FINDINGS: Ankle mortise intact. The talar dome is normal. No malleolar
fracture. The calcaneus is normal.
IMPRESSION: No acute osseous findings.  No obvious soft tissue abnormality.

## 2020-04-14 DIAGNOSIS — M461 Sacroiliitis, not elsewhere classified: Secondary | ICD-10-CM | POA: Diagnosis not present

## 2020-05-04 DIAGNOSIS — J329 Chronic sinusitis, unspecified: Secondary | ICD-10-CM | POA: Diagnosis not present

## 2020-05-04 DIAGNOSIS — J310 Chronic rhinitis: Secondary | ICD-10-CM | POA: Diagnosis not present

## 2020-05-04 DIAGNOSIS — L550 Sunburn of first degree: Secondary | ICD-10-CM | POA: Diagnosis not present

## 2020-05-04 DIAGNOSIS — N529 Male erectile dysfunction, unspecified: Secondary | ICD-10-CM | POA: Diagnosis not present

## 2020-05-04 DIAGNOSIS — B001 Herpesviral vesicular dermatitis: Secondary | ICD-10-CM | POA: Diagnosis not present

## 2020-05-05 DIAGNOSIS — N529 Male erectile dysfunction, unspecified: Secondary | ICD-10-CM | POA: Diagnosis not present

## 2020-05-07 DIAGNOSIS — N3281 Overactive bladder: Secondary | ICD-10-CM | POA: Diagnosis not present

## 2020-05-07 DIAGNOSIS — F330 Major depressive disorder, recurrent, mild: Secondary | ICD-10-CM | POA: Diagnosis not present

## 2020-05-07 DIAGNOSIS — F411 Generalized anxiety disorder: Secondary | ICD-10-CM | POA: Diagnosis not present

## 2020-05-07 DIAGNOSIS — R030 Elevated blood-pressure reading, without diagnosis of hypertension: Secondary | ICD-10-CM | POA: Diagnosis not present

## 2020-05-24 DIAGNOSIS — R05 Cough: Secondary | ICD-10-CM | POA: Diagnosis not present

## 2020-05-24 DIAGNOSIS — Z20822 Contact with and (suspected) exposure to covid-19: Secondary | ICD-10-CM | POA: Diagnosis not present

## 2020-05-24 DIAGNOSIS — J0191 Acute recurrent sinusitis, unspecified: Secondary | ICD-10-CM | POA: Diagnosis not present

## 2020-05-26 DIAGNOSIS — F429 Obsessive-compulsive disorder, unspecified: Secondary | ICD-10-CM | POA: Diagnosis not present

## 2020-05-26 DIAGNOSIS — N3941 Urge incontinence: Secondary | ICD-10-CM | POA: Diagnosis not present

## 2020-05-26 DIAGNOSIS — M6289 Other specified disorders of muscle: Secondary | ICD-10-CM | POA: Diagnosis not present

## 2020-05-26 DIAGNOSIS — R350 Frequency of micturition: Secondary | ICD-10-CM | POA: Diagnosis not present

## 2020-05-26 DIAGNOSIS — R102 Pelvic and perineal pain: Secondary | ICD-10-CM | POA: Diagnosis not present

## 2020-05-26 DIAGNOSIS — M533 Sacrococcygeal disorders, not elsewhere classified: Secondary | ICD-10-CM | POA: Diagnosis not present

## 2020-05-26 DIAGNOSIS — N3281 Overactive bladder: Secondary | ICD-10-CM | POA: Diagnosis not present

## 2020-05-26 DIAGNOSIS — N3943 Post-void dribbling: Secondary | ICD-10-CM | POA: Diagnosis not present

## 2020-06-01 DIAGNOSIS — M6289 Other specified disorders of muscle: Secondary | ICD-10-CM | POA: Diagnosis not present

## 2020-06-01 DIAGNOSIS — R338 Other retention of urine: Secondary | ICD-10-CM | POA: Diagnosis not present

## 2020-06-01 DIAGNOSIS — N3943 Post-void dribbling: Secondary | ICD-10-CM | POA: Diagnosis not present

## 2020-06-01 DIAGNOSIS — N398 Other specified disorders of urinary system: Secondary | ICD-10-CM | POA: Diagnosis not present

## 2020-06-01 DIAGNOSIS — F429 Obsessive-compulsive disorder, unspecified: Secondary | ICD-10-CM | POA: Diagnosis not present

## 2020-06-01 DIAGNOSIS — N401 Enlarged prostate with lower urinary tract symptoms: Secondary | ICD-10-CM | POA: Diagnosis not present

## 2020-06-02 DIAGNOSIS — N3941 Urge incontinence: Secondary | ICD-10-CM | POA: Diagnosis not present

## 2020-06-02 DIAGNOSIS — M6289 Other specified disorders of muscle: Secondary | ICD-10-CM | POA: Diagnosis not present

## 2020-06-02 DIAGNOSIS — N3943 Post-void dribbling: Secondary | ICD-10-CM | POA: Diagnosis not present

## 2020-06-02 DIAGNOSIS — N3281 Overactive bladder: Secondary | ICD-10-CM | POA: Diagnosis not present

## 2020-06-02 DIAGNOSIS — R350 Frequency of micturition: Secondary | ICD-10-CM | POA: Diagnosis not present

## 2020-06-02 DIAGNOSIS — M533 Sacrococcygeal disorders, not elsewhere classified: Secondary | ICD-10-CM | POA: Diagnosis not present

## 2020-06-02 DIAGNOSIS — R102 Pelvic and perineal pain: Secondary | ICD-10-CM | POA: Diagnosis not present

## 2020-06-03 DIAGNOSIS — H903 Sensorineural hearing loss, bilateral: Secondary | ICD-10-CM | POA: Diagnosis not present

## 2020-06-03 DIAGNOSIS — Z8669 Personal history of other diseases of the nervous system and sense organs: Secondary | ICD-10-CM | POA: Diagnosis not present

## 2020-06-09 DIAGNOSIS — M79604 Pain in right leg: Secondary | ICD-10-CM | POA: Diagnosis not present

## 2020-06-09 DIAGNOSIS — I8311 Varicose veins of right lower extremity with inflammation: Secondary | ICD-10-CM | POA: Diagnosis not present

## 2020-06-10 DIAGNOSIS — N3941 Urge incontinence: Secondary | ICD-10-CM | POA: Diagnosis not present

## 2020-06-10 DIAGNOSIS — M6289 Other specified disorders of muscle: Secondary | ICD-10-CM | POA: Diagnosis not present

## 2020-06-10 DIAGNOSIS — R102 Pelvic and perineal pain: Secondary | ICD-10-CM | POA: Diagnosis not present

## 2020-06-10 DIAGNOSIS — N3943 Post-void dribbling: Secondary | ICD-10-CM | POA: Diagnosis not present

## 2020-06-10 DIAGNOSIS — N3281 Overactive bladder: Secondary | ICD-10-CM | POA: Diagnosis not present

## 2020-06-10 DIAGNOSIS — M533 Sacrococcygeal disorders, not elsewhere classified: Secondary | ICD-10-CM | POA: Diagnosis not present

## 2020-06-10 DIAGNOSIS — R350 Frequency of micturition: Secondary | ICD-10-CM | POA: Diagnosis not present

## 2020-06-28 DIAGNOSIS — R3914 Feeling of incomplete bladder emptying: Secondary | ICD-10-CM | POA: Diagnosis not present

## 2020-06-28 DIAGNOSIS — M5416 Radiculopathy, lumbar region: Secondary | ICD-10-CM | POA: Diagnosis not present

## 2020-06-28 DIAGNOSIS — R350 Frequency of micturition: Secondary | ICD-10-CM | POA: Diagnosis not present

## 2020-06-28 DIAGNOSIS — N3941 Urge incontinence: Secondary | ICD-10-CM | POA: Diagnosis not present

## 2020-06-28 DIAGNOSIS — F429 Obsessive-compulsive disorder, unspecified: Secondary | ICD-10-CM | POA: Diagnosis not present

## 2020-06-28 DIAGNOSIS — M533 Sacrococcygeal disorders, not elsewhere classified: Secondary | ICD-10-CM | POA: Diagnosis not present

## 2020-06-28 DIAGNOSIS — N35919 Unspecified urethral stricture, male, unspecified site: Secondary | ICD-10-CM | POA: Diagnosis not present

## 2020-06-28 DIAGNOSIS — N401 Enlarged prostate with lower urinary tract symptoms: Secondary | ICD-10-CM | POA: Diagnosis not present

## 2020-06-28 DIAGNOSIS — N3943 Post-void dribbling: Secondary | ICD-10-CM | POA: Diagnosis not present

## 2020-06-28 DIAGNOSIS — N411 Chronic prostatitis: Secondary | ICD-10-CM | POA: Diagnosis not present

## 2020-06-28 DIAGNOSIS — N138 Other obstructive and reflux uropathy: Secondary | ICD-10-CM | POA: Diagnosis not present

## 2020-06-29 DIAGNOSIS — R339 Retention of urine, unspecified: Secondary | ICD-10-CM | POA: Diagnosis not present

## 2020-06-29 DIAGNOSIS — Z466 Encounter for fitting and adjustment of urinary device: Secondary | ICD-10-CM | POA: Diagnosis not present

## 2020-07-01 DIAGNOSIS — N3001 Acute cystitis with hematuria: Secondary | ICD-10-CM | POA: Diagnosis not present

## 2020-07-01 DIAGNOSIS — J0140 Acute pansinusitis, unspecified: Secondary | ICD-10-CM | POA: Diagnosis not present

## 2020-07-08 DIAGNOSIS — N3281 Overactive bladder: Secondary | ICD-10-CM | POA: Diagnosis not present

## 2020-07-08 DIAGNOSIS — M6289 Other specified disorders of muscle: Secondary | ICD-10-CM | POA: Diagnosis not present

## 2020-07-08 DIAGNOSIS — M533 Sacrococcygeal disorders, not elsewhere classified: Secondary | ICD-10-CM | POA: Diagnosis not present

## 2020-07-08 DIAGNOSIS — R102 Pelvic and perineal pain: Secondary | ICD-10-CM | POA: Diagnosis not present

## 2020-07-08 DIAGNOSIS — R350 Frequency of micturition: Secondary | ICD-10-CM | POA: Diagnosis not present

## 2020-07-08 DIAGNOSIS — N3943 Post-void dribbling: Secondary | ICD-10-CM | POA: Diagnosis not present

## 2020-07-08 DIAGNOSIS — N3941 Urge incontinence: Secondary | ICD-10-CM | POA: Diagnosis not present

## 2020-07-13 DIAGNOSIS — R102 Pelvic and perineal pain: Secondary | ICD-10-CM | POA: Diagnosis not present

## 2020-07-13 DIAGNOSIS — N3941 Urge incontinence: Secondary | ICD-10-CM | POA: Diagnosis not present

## 2020-07-13 DIAGNOSIS — N3281 Overactive bladder: Secondary | ICD-10-CM | POA: Diagnosis not present

## 2020-07-13 DIAGNOSIS — M533 Sacrococcygeal disorders, not elsewhere classified: Secondary | ICD-10-CM | POA: Diagnosis not present

## 2020-07-13 DIAGNOSIS — N3943 Post-void dribbling: Secondary | ICD-10-CM | POA: Diagnosis not present

## 2020-07-13 DIAGNOSIS — M6289 Other specified disorders of muscle: Secondary | ICD-10-CM | POA: Diagnosis not present

## 2020-07-13 DIAGNOSIS — R350 Frequency of micturition: Secondary | ICD-10-CM | POA: Diagnosis not present

## 2020-07-20 DIAGNOSIS — R3129 Other microscopic hematuria: Secondary | ICD-10-CM | POA: Diagnosis not present

## 2020-07-20 DIAGNOSIS — R338 Other retention of urine: Secondary | ICD-10-CM | POA: Diagnosis not present

## 2020-07-20 DIAGNOSIS — R82998 Other abnormal findings in urine: Secondary | ICD-10-CM | POA: Diagnosis not present

## 2020-07-20 DIAGNOSIS — N9489 Other specified conditions associated with female genital organs and menstrual cycle: Secondary | ICD-10-CM | POA: Diagnosis not present

## 2020-07-20 DIAGNOSIS — N401 Enlarged prostate with lower urinary tract symptoms: Secondary | ICD-10-CM | POA: Diagnosis not present

## 2020-07-20 DIAGNOSIS — R69 Illness, unspecified: Secondary | ICD-10-CM | POA: Diagnosis not present

## 2020-07-20 DIAGNOSIS — R3914 Feeling of incomplete bladder emptying: Secondary | ICD-10-CM | POA: Diagnosis not present

## 2020-07-20 DIAGNOSIS — R350 Frequency of micturition: Secondary | ICD-10-CM | POA: Diagnosis not present

## 2020-08-05 DIAGNOSIS — K629 Disease of anus and rectum, unspecified: Secondary | ICD-10-CM | POA: Diagnosis not present

## 2020-08-05 DIAGNOSIS — K625 Hemorrhage of anus and rectum: Secondary | ICD-10-CM | POA: Diagnosis not present

## 2020-08-18 DIAGNOSIS — R350 Frequency of micturition: Secondary | ICD-10-CM | POA: Diagnosis not present

## 2020-08-18 DIAGNOSIS — M533 Sacrococcygeal disorders, not elsewhere classified: Secondary | ICD-10-CM | POA: Diagnosis not present

## 2020-08-18 DIAGNOSIS — R3915 Urgency of urination: Secondary | ICD-10-CM | POA: Diagnosis not present

## 2020-08-18 DIAGNOSIS — N3281 Overactive bladder: Secondary | ICD-10-CM | POA: Diagnosis not present

## 2020-08-18 DIAGNOSIS — M6289 Other specified disorders of muscle: Secondary | ICD-10-CM | POA: Diagnosis not present

## 2020-08-18 DIAGNOSIS — M6281 Muscle weakness (generalized): Secondary | ICD-10-CM | POA: Diagnosis not present

## 2020-08-19 DIAGNOSIS — M6289 Other specified disorders of muscle: Secondary | ICD-10-CM | POA: Diagnosis not present

## 2020-08-25 DIAGNOSIS — M533 Sacrococcygeal disorders, not elsewhere classified: Secondary | ICD-10-CM | POA: Diagnosis not present

## 2020-08-25 DIAGNOSIS — M6289 Other specified disorders of muscle: Secondary | ICD-10-CM | POA: Diagnosis not present

## 2020-08-25 DIAGNOSIS — N3281 Overactive bladder: Secondary | ICD-10-CM | POA: Diagnosis not present

## 2020-08-25 DIAGNOSIS — M6281 Muscle weakness (generalized): Secondary | ICD-10-CM | POA: Diagnosis not present

## 2020-08-25 DIAGNOSIS — R3915 Urgency of urination: Secondary | ICD-10-CM | POA: Diagnosis not present

## 2020-08-25 DIAGNOSIS — R350 Frequency of micturition: Secondary | ICD-10-CM | POA: Diagnosis not present

## 2020-09-01 DIAGNOSIS — M533 Sacrococcygeal disorders, not elsewhere classified: Secondary | ICD-10-CM | POA: Diagnosis not present

## 2020-09-01 DIAGNOSIS — R350 Frequency of micturition: Secondary | ICD-10-CM | POA: Diagnosis not present

## 2020-09-01 DIAGNOSIS — M6281 Muscle weakness (generalized): Secondary | ICD-10-CM | POA: Diagnosis not present

## 2020-09-01 DIAGNOSIS — M6289 Other specified disorders of muscle: Secondary | ICD-10-CM | POA: Diagnosis not present

## 2020-09-01 DIAGNOSIS — R3915 Urgency of urination: Secondary | ICD-10-CM | POA: Diagnosis not present

## 2020-09-01 DIAGNOSIS — N3281 Overactive bladder: Secondary | ICD-10-CM | POA: Diagnosis not present

## 2020-09-07 DIAGNOSIS — N3281 Overactive bladder: Secondary | ICD-10-CM | POA: Diagnosis not present

## 2020-09-07 DIAGNOSIS — M6289 Other specified disorders of muscle: Secondary | ICD-10-CM | POA: Diagnosis not present

## 2020-09-07 DIAGNOSIS — M6281 Muscle weakness (generalized): Secondary | ICD-10-CM | POA: Diagnosis not present

## 2020-09-07 DIAGNOSIS — M533 Sacrococcygeal disorders, not elsewhere classified: Secondary | ICD-10-CM | POA: Diagnosis not present

## 2020-09-07 DIAGNOSIS — R3915 Urgency of urination: Secondary | ICD-10-CM | POA: Diagnosis not present

## 2020-09-07 DIAGNOSIS — R350 Frequency of micturition: Secondary | ICD-10-CM | POA: Diagnosis not present

## 2020-09-10 DIAGNOSIS — R350 Frequency of micturition: Secondary | ICD-10-CM | POA: Diagnosis not present

## 2020-09-13 DIAGNOSIS — K929 Disease of digestive system, unspecified: Secondary | ICD-10-CM | POA: Diagnosis not present

## 2020-09-13 DIAGNOSIS — R935 Abnormal findings on diagnostic imaging of other abdominal regions, including retroperitoneum: Secondary | ICD-10-CM | POA: Diagnosis not present

## 2020-09-14 DIAGNOSIS — R198 Other specified symptoms and signs involving the digestive system and abdomen: Secondary | ICD-10-CM | POA: Diagnosis not present

## 2020-09-15 DIAGNOSIS — M533 Sacrococcygeal disorders, not elsewhere classified: Secondary | ICD-10-CM | POA: Diagnosis not present

## 2020-09-15 DIAGNOSIS — R339 Retention of urine, unspecified: Secondary | ICD-10-CM | POA: Diagnosis not present

## 2020-09-15 DIAGNOSIS — R350 Frequency of micturition: Secondary | ICD-10-CM | POA: Diagnosis not present

## 2020-09-15 DIAGNOSIS — N3281 Overactive bladder: Secondary | ICD-10-CM | POA: Diagnosis not present

## 2020-09-15 DIAGNOSIS — M6289 Other specified disorders of muscle: Secondary | ICD-10-CM | POA: Diagnosis not present

## 2020-09-22 DIAGNOSIS — M6289 Other specified disorders of muscle: Secondary | ICD-10-CM | POA: Diagnosis not present

## 2020-09-22 DIAGNOSIS — N3281 Overactive bladder: Secondary | ICD-10-CM | POA: Diagnosis not present

## 2020-09-22 DIAGNOSIS — R339 Retention of urine, unspecified: Secondary | ICD-10-CM | POA: Diagnosis not present

## 2020-09-22 DIAGNOSIS — M533 Sacrococcygeal disorders, not elsewhere classified: Secondary | ICD-10-CM | POA: Diagnosis not present

## 2020-09-22 DIAGNOSIS — R350 Frequency of micturition: Secondary | ICD-10-CM | POA: Diagnosis not present

## 2020-10-11 DIAGNOSIS — D125 Benign neoplasm of sigmoid colon: Secondary | ICD-10-CM | POA: Diagnosis not present

## 2020-10-11 DIAGNOSIS — D126 Benign neoplasm of colon, unspecified: Secondary | ICD-10-CM | POA: Diagnosis not present

## 2020-10-11 DIAGNOSIS — Z1211 Encounter for screening for malignant neoplasm of colon: Secondary | ICD-10-CM | POA: Diagnosis not present

## 2020-10-11 DIAGNOSIS — K921 Melena: Secondary | ICD-10-CM | POA: Diagnosis not present

## 2020-10-11 DIAGNOSIS — K625 Hemorrhage of anus and rectum: Secondary | ICD-10-CM | POA: Diagnosis not present

## 2020-10-20 DIAGNOSIS — M533 Sacrococcygeal disorders, not elsewhere classified: Secondary | ICD-10-CM | POA: Diagnosis not present

## 2020-10-20 DIAGNOSIS — M6289 Other specified disorders of muscle: Secondary | ICD-10-CM | POA: Diagnosis not present

## 2020-10-20 DIAGNOSIS — R339 Retention of urine, unspecified: Secondary | ICD-10-CM | POA: Diagnosis not present

## 2020-10-20 DIAGNOSIS — M6281 Muscle weakness (generalized): Secondary | ICD-10-CM | POA: Diagnosis not present

## 2020-10-20 DIAGNOSIS — N3281 Overactive bladder: Secondary | ICD-10-CM | POA: Diagnosis not present

## 2020-10-20 DIAGNOSIS — R350 Frequency of micturition: Secondary | ICD-10-CM | POA: Diagnosis not present

## 2020-10-27 DIAGNOSIS — R350 Frequency of micturition: Secondary | ICD-10-CM | POA: Diagnosis not present

## 2020-10-27 DIAGNOSIS — R339 Retention of urine, unspecified: Secondary | ICD-10-CM | POA: Diagnosis not present

## 2020-10-27 DIAGNOSIS — M6281 Muscle weakness (generalized): Secondary | ICD-10-CM | POA: Diagnosis not present

## 2020-10-27 DIAGNOSIS — M6289 Other specified disorders of muscle: Secondary | ICD-10-CM | POA: Diagnosis not present

## 2020-10-27 DIAGNOSIS — N3281 Overactive bladder: Secondary | ICD-10-CM | POA: Diagnosis not present

## 2020-10-27 DIAGNOSIS — M533 Sacrococcygeal disorders, not elsewhere classified: Secondary | ICD-10-CM | POA: Diagnosis not present

## 2020-10-28 DIAGNOSIS — N3281 Overactive bladder: Secondary | ICD-10-CM | POA: Diagnosis not present

## 2020-11-03 DIAGNOSIS — M6281 Muscle weakness (generalized): Secondary | ICD-10-CM | POA: Diagnosis not present

## 2020-11-03 DIAGNOSIS — R350 Frequency of micturition: Secondary | ICD-10-CM | POA: Diagnosis not present

## 2020-11-03 DIAGNOSIS — N3281 Overactive bladder: Secondary | ICD-10-CM | POA: Diagnosis not present

## 2020-11-03 DIAGNOSIS — M6289 Other specified disorders of muscle: Secondary | ICD-10-CM | POA: Diagnosis not present

## 2020-11-03 DIAGNOSIS — R339 Retention of urine, unspecified: Secondary | ICD-10-CM | POA: Diagnosis not present

## 2020-11-03 DIAGNOSIS — M533 Sacrococcygeal disorders, not elsewhere classified: Secondary | ICD-10-CM | POA: Diagnosis not present

## 2020-11-04 DIAGNOSIS — M6281 Muscle weakness (generalized): Secondary | ICD-10-CM | POA: Diagnosis not present

## 2020-11-04 DIAGNOSIS — M62838 Other muscle spasm: Secondary | ICD-10-CM | POA: Diagnosis not present

## 2020-11-04 DIAGNOSIS — N3281 Overactive bladder: Secondary | ICD-10-CM | POA: Diagnosis not present

## 2020-11-04 DIAGNOSIS — M6289 Other specified disorders of muscle: Secondary | ICD-10-CM | POA: Diagnosis not present

## 2020-11-10 DIAGNOSIS — M533 Sacrococcygeal disorders, not elsewhere classified: Secondary | ICD-10-CM | POA: Diagnosis not present

## 2020-11-10 DIAGNOSIS — N3281 Overactive bladder: Secondary | ICD-10-CM | POA: Diagnosis not present

## 2020-11-10 DIAGNOSIS — R339 Retention of urine, unspecified: Secondary | ICD-10-CM | POA: Diagnosis not present

## 2020-11-10 DIAGNOSIS — M6281 Muscle weakness (generalized): Secondary | ICD-10-CM | POA: Diagnosis not present

## 2020-11-10 DIAGNOSIS — M6289 Other specified disorders of muscle: Secondary | ICD-10-CM | POA: Diagnosis not present

## 2020-11-10 DIAGNOSIS — R350 Frequency of micturition: Secondary | ICD-10-CM | POA: Diagnosis not present

## 2020-12-02 DIAGNOSIS — H905 Unspecified sensorineural hearing loss: Secondary | ICD-10-CM | POA: Diagnosis not present

## 2020-12-03 DIAGNOSIS — M62838 Other muscle spasm: Secondary | ICD-10-CM | POA: Diagnosis not present

## 2020-12-03 DIAGNOSIS — M6289 Other specified disorders of muscle: Secondary | ICD-10-CM | POA: Diagnosis not present

## 2020-12-03 DIAGNOSIS — M6281 Muscle weakness (generalized): Secondary | ICD-10-CM | POA: Diagnosis not present

## 2020-12-03 DIAGNOSIS — N3281 Overactive bladder: Secondary | ICD-10-CM | POA: Diagnosis not present

## 2020-12-08 DIAGNOSIS — Z20822 Contact with and (suspected) exposure to covid-19: Secondary | ICD-10-CM | POA: Diagnosis not present

## 2020-12-08 DIAGNOSIS — J018 Other acute sinusitis: Secondary | ICD-10-CM | POA: Diagnosis not present

## 2020-12-13 DIAGNOSIS — M62838 Other muscle spasm: Secondary | ICD-10-CM | POA: Diagnosis not present

## 2020-12-13 DIAGNOSIS — M6281 Muscle weakness (generalized): Secondary | ICD-10-CM | POA: Diagnosis not present

## 2020-12-20 DIAGNOSIS — M6289 Other specified disorders of muscle: Secondary | ICD-10-CM | POA: Diagnosis not present

## 2020-12-20 DIAGNOSIS — R3912 Poor urinary stream: Secondary | ICD-10-CM | POA: Diagnosis not present

## 2020-12-20 DIAGNOSIS — R3913 Splitting of urinary stream: Secondary | ICD-10-CM | POA: Diagnosis not present

## 2020-12-31 DIAGNOSIS — Z20822 Contact with and (suspected) exposure to covid-19: Secondary | ICD-10-CM | POA: Diagnosis not present

## 2020-12-31 DIAGNOSIS — J00 Acute nasopharyngitis [common cold]: Secondary | ICD-10-CM | POA: Diagnosis not present

## 2021-01-03 DIAGNOSIS — M62838 Other muscle spasm: Secondary | ICD-10-CM | POA: Diagnosis not present

## 2021-01-03 DIAGNOSIS — M6281 Muscle weakness (generalized): Secondary | ICD-10-CM | POA: Diagnosis not present

## 2021-01-03 DIAGNOSIS — R3912 Poor urinary stream: Secondary | ICD-10-CM | POA: Diagnosis not present

## 2021-01-03 DIAGNOSIS — R3913 Splitting of urinary stream: Secondary | ICD-10-CM | POA: Diagnosis not present

## 2021-01-10 DIAGNOSIS — M62838 Other muscle spasm: Secondary | ICD-10-CM | POA: Diagnosis not present

## 2021-01-10 DIAGNOSIS — R3912 Poor urinary stream: Secondary | ICD-10-CM | POA: Diagnosis not present

## 2021-01-10 DIAGNOSIS — M6281 Muscle weakness (generalized): Secondary | ICD-10-CM | POA: Diagnosis not present

## 2021-01-13 DIAGNOSIS — R059 Cough, unspecified: Secondary | ICD-10-CM | POA: Diagnosis not present

## 2021-01-17 DIAGNOSIS — R3912 Poor urinary stream: Secondary | ICD-10-CM | POA: Diagnosis not present

## 2021-01-17 DIAGNOSIS — M62838 Other muscle spasm: Secondary | ICD-10-CM | POA: Diagnosis not present

## 2021-01-17 DIAGNOSIS — N3281 Overactive bladder: Secondary | ICD-10-CM | POA: Diagnosis not present

## 2021-01-17 DIAGNOSIS — M6281 Muscle weakness (generalized): Secondary | ICD-10-CM | POA: Diagnosis not present

## 2021-02-02 DIAGNOSIS — M6281 Muscle weakness (generalized): Secondary | ICD-10-CM | POA: Diagnosis not present

## 2021-02-02 DIAGNOSIS — M62838 Other muscle spasm: Secondary | ICD-10-CM | POA: Diagnosis not present

## 2021-02-02 DIAGNOSIS — R3912 Poor urinary stream: Secondary | ICD-10-CM | POA: Diagnosis not present

## 2021-02-04 DIAGNOSIS — Z6821 Body mass index (BMI) 21.0-21.9, adult: Secondary | ICD-10-CM | POA: Diagnosis not present

## 2021-02-04 DIAGNOSIS — Z0000 Encounter for general adult medical examination without abnormal findings: Secondary | ICD-10-CM | POA: Diagnosis not present

## 2021-02-04 DIAGNOSIS — K219 Gastro-esophageal reflux disease without esophagitis: Secondary | ICD-10-CM | POA: Diagnosis not present

## 2021-02-04 DIAGNOSIS — G894 Chronic pain syndrome: Secondary | ICD-10-CM | POA: Diagnosis not present

## 2021-02-04 DIAGNOSIS — J302 Other seasonal allergic rhinitis: Secondary | ICD-10-CM | POA: Diagnosis not present

## 2021-02-04 DIAGNOSIS — N320 Bladder-neck obstruction: Secondary | ICD-10-CM | POA: Diagnosis not present

## 2021-02-04 DIAGNOSIS — I8390 Asymptomatic varicose veins of unspecified lower extremity: Secondary | ICD-10-CM | POA: Diagnosis not present

## 2021-02-04 DIAGNOSIS — E7800 Pure hypercholesterolemia, unspecified: Secondary | ICD-10-CM | POA: Diagnosis not present

## 2021-02-21 DIAGNOSIS — M6281 Muscle weakness (generalized): Secondary | ICD-10-CM | POA: Diagnosis not present

## 2021-02-21 DIAGNOSIS — M62838 Other muscle spasm: Secondary | ICD-10-CM | POA: Diagnosis not present

## 2021-02-21 DIAGNOSIS — R3912 Poor urinary stream: Secondary | ICD-10-CM | POA: Diagnosis not present

## 2021-03-10 DIAGNOSIS — R3912 Poor urinary stream: Secondary | ICD-10-CM | POA: Diagnosis not present

## 2021-03-10 DIAGNOSIS — M6281 Muscle weakness (generalized): Secondary | ICD-10-CM | POA: Diagnosis not present

## 2021-03-10 DIAGNOSIS — M62838 Other muscle spasm: Secondary | ICD-10-CM | POA: Diagnosis not present

## 2021-03-19 DIAGNOSIS — M25571 Pain in right ankle and joints of right foot: Secondary | ICD-10-CM | POA: Diagnosis not present

## 2021-03-19 DIAGNOSIS — S93401A Sprain of unspecified ligament of right ankle, initial encounter: Secondary | ICD-10-CM | POA: Diagnosis not present

## 2021-03-22 DIAGNOSIS — S86111A Strain of other muscle(s) and tendon(s) of posterior muscle group at lower leg level, right leg, initial encounter: Secondary | ICD-10-CM | POA: Diagnosis not present

## 2021-03-23 DIAGNOSIS — R3912 Poor urinary stream: Secondary | ICD-10-CM | POA: Diagnosis not present

## 2021-03-23 DIAGNOSIS — M6281 Muscle weakness (generalized): Secondary | ICD-10-CM | POA: Diagnosis not present

## 2021-03-23 DIAGNOSIS — M62838 Other muscle spasm: Secondary | ICD-10-CM | POA: Diagnosis not present

## 2021-03-24 DIAGNOSIS — R600 Localized edema: Secondary | ICD-10-CM | POA: Diagnosis not present

## 2021-03-24 DIAGNOSIS — M79661 Pain in right lower leg: Secondary | ICD-10-CM | POA: Diagnosis not present

## 2021-03-24 DIAGNOSIS — Z87828 Personal history of other (healed) physical injury and trauma: Secondary | ICD-10-CM | POA: Diagnosis not present

## 2021-03-28 DIAGNOSIS — M25571 Pain in right ankle and joints of right foot: Secondary | ICD-10-CM | POA: Diagnosis not present

## 2021-03-28 DIAGNOSIS — M79661 Pain in right lower leg: Secondary | ICD-10-CM | POA: Diagnosis not present

## 2021-03-28 DIAGNOSIS — M25671 Stiffness of right ankle, not elsewhere classified: Secondary | ICD-10-CM | POA: Diagnosis not present

## 2021-04-01 DIAGNOSIS — M25671 Stiffness of right ankle, not elsewhere classified: Secondary | ICD-10-CM | POA: Diagnosis not present

## 2021-04-01 DIAGNOSIS — M79661 Pain in right lower leg: Secondary | ICD-10-CM | POA: Diagnosis not present

## 2021-04-01 DIAGNOSIS — M25571 Pain in right ankle and joints of right foot: Secondary | ICD-10-CM | POA: Diagnosis not present

## 2021-04-05 DIAGNOSIS — M25571 Pain in right ankle and joints of right foot: Secondary | ICD-10-CM | POA: Diagnosis not present

## 2021-04-05 DIAGNOSIS — M79661 Pain in right lower leg: Secondary | ICD-10-CM | POA: Diagnosis not present

## 2021-04-05 DIAGNOSIS — M25671 Stiffness of right ankle, not elsewhere classified: Secondary | ICD-10-CM | POA: Diagnosis not present

## 2021-04-06 DIAGNOSIS — M6281 Muscle weakness (generalized): Secondary | ICD-10-CM | POA: Diagnosis not present

## 2021-04-06 DIAGNOSIS — R3912 Poor urinary stream: Secondary | ICD-10-CM | POA: Diagnosis not present

## 2021-04-06 DIAGNOSIS — M62838 Other muscle spasm: Secondary | ICD-10-CM | POA: Diagnosis not present

## 2021-04-07 DIAGNOSIS — M79661 Pain in right lower leg: Secondary | ICD-10-CM | POA: Diagnosis not present

## 2021-04-07 DIAGNOSIS — M25571 Pain in right ankle and joints of right foot: Secondary | ICD-10-CM | POA: Diagnosis not present

## 2021-04-07 DIAGNOSIS — M25671 Stiffness of right ankle, not elsewhere classified: Secondary | ICD-10-CM | POA: Diagnosis not present

## 2021-04-12 DIAGNOSIS — E7800 Pure hypercholesterolemia, unspecified: Secondary | ICD-10-CM | POA: Diagnosis not present

## 2021-04-14 DIAGNOSIS — M25671 Stiffness of right ankle, not elsewhere classified: Secondary | ICD-10-CM | POA: Diagnosis not present

## 2021-04-14 DIAGNOSIS — M25571 Pain in right ankle and joints of right foot: Secondary | ICD-10-CM | POA: Diagnosis not present

## 2021-04-14 DIAGNOSIS — M79661 Pain in right lower leg: Secondary | ICD-10-CM | POA: Diagnosis not present

## 2021-04-19 DIAGNOSIS — L309 Dermatitis, unspecified: Secondary | ICD-10-CM | POA: Diagnosis not present

## 2021-04-19 DIAGNOSIS — D2239 Melanocytic nevi of other parts of face: Secondary | ICD-10-CM | POA: Diagnosis not present

## 2021-04-19 DIAGNOSIS — D225 Melanocytic nevi of trunk: Secondary | ICD-10-CM | POA: Diagnosis not present

## 2021-04-19 DIAGNOSIS — L570 Actinic keratosis: Secondary | ICD-10-CM | POA: Diagnosis not present

## 2021-04-19 DIAGNOSIS — L821 Other seborrheic keratosis: Secondary | ICD-10-CM | POA: Diagnosis not present

## 2021-04-20 DIAGNOSIS — S76012A Strain of muscle, fascia and tendon of left hip, initial encounter: Secondary | ICD-10-CM | POA: Diagnosis not present

## 2021-04-20 DIAGNOSIS — J329 Chronic sinusitis, unspecified: Secondary | ICD-10-CM | POA: Diagnosis not present

## 2021-04-20 DIAGNOSIS — R059 Cough, unspecified: Secondary | ICD-10-CM | POA: Diagnosis not present

## 2021-04-21 DIAGNOSIS — M25671 Stiffness of right ankle, not elsewhere classified: Secondary | ICD-10-CM | POA: Diagnosis not present

## 2021-04-21 DIAGNOSIS — M25571 Pain in right ankle and joints of right foot: Secondary | ICD-10-CM | POA: Diagnosis not present

## 2021-04-21 DIAGNOSIS — M79661 Pain in right lower leg: Secondary | ICD-10-CM | POA: Diagnosis not present

## 2021-04-25 DIAGNOSIS — S86111A Strain of other muscle(s) and tendon(s) of posterior muscle group at lower leg level, right leg, initial encounter: Secondary | ICD-10-CM | POA: Diagnosis not present

## 2021-04-26 DIAGNOSIS — M62838 Other muscle spasm: Secondary | ICD-10-CM | POA: Diagnosis not present

## 2021-04-26 DIAGNOSIS — N3281 Overactive bladder: Secondary | ICD-10-CM | POA: Diagnosis not present

## 2021-04-26 DIAGNOSIS — M6281 Muscle weakness (generalized): Secondary | ICD-10-CM | POA: Diagnosis not present

## 2021-04-26 DIAGNOSIS — R3912 Poor urinary stream: Secondary | ICD-10-CM | POA: Diagnosis not present

## 2021-04-28 DIAGNOSIS — M79661 Pain in right lower leg: Secondary | ICD-10-CM | POA: Diagnosis not present

## 2021-04-28 DIAGNOSIS — M25671 Stiffness of right ankle, not elsewhere classified: Secondary | ICD-10-CM | POA: Diagnosis not present

## 2021-04-28 DIAGNOSIS — M25571 Pain in right ankle and joints of right foot: Secondary | ICD-10-CM | POA: Diagnosis not present

## 2021-05-12 DIAGNOSIS — R3912 Poor urinary stream: Secondary | ICD-10-CM | POA: Diagnosis not present

## 2021-05-12 DIAGNOSIS — M62838 Other muscle spasm: Secondary | ICD-10-CM | POA: Diagnosis not present

## 2021-05-12 DIAGNOSIS — M6281 Muscle weakness (generalized): Secondary | ICD-10-CM | POA: Diagnosis not present

## 2021-05-13 DIAGNOSIS — Z9889 Other specified postprocedural states: Secondary | ICD-10-CM | POA: Diagnosis not present

## 2021-05-13 DIAGNOSIS — R519 Headache, unspecified: Secondary | ICD-10-CM | POA: Diagnosis not present

## 2021-05-13 DIAGNOSIS — J3489 Other specified disorders of nose and nasal sinuses: Secondary | ICD-10-CM | POA: Diagnosis not present

## 2021-05-13 DIAGNOSIS — J069 Acute upper respiratory infection, unspecified: Secondary | ICD-10-CM | POA: Diagnosis not present

## 2021-05-20 DIAGNOSIS — J329 Chronic sinusitis, unspecified: Secondary | ICD-10-CM | POA: Diagnosis not present

## 2021-05-20 DIAGNOSIS — Z20822 Contact with and (suspected) exposure to covid-19: Secondary | ICD-10-CM | POA: Diagnosis not present

## 2021-05-20 DIAGNOSIS — J0121 Acute recurrent ethmoidal sinusitis: Secondary | ICD-10-CM | POA: Diagnosis not present

## 2021-05-30 DIAGNOSIS — R3912 Poor urinary stream: Secondary | ICD-10-CM | POA: Diagnosis not present

## 2021-05-30 DIAGNOSIS — M6281 Muscle weakness (generalized): Secondary | ICD-10-CM | POA: Diagnosis not present

## 2021-05-30 DIAGNOSIS — M62838 Other muscle spasm: Secondary | ICD-10-CM | POA: Diagnosis not present

## 2021-06-07 DIAGNOSIS — E7800 Pure hypercholesterolemia, unspecified: Secondary | ICD-10-CM | POA: Diagnosis not present

## 2021-06-07 DIAGNOSIS — J329 Chronic sinusitis, unspecified: Secondary | ICD-10-CM | POA: Diagnosis not present

## 2021-06-07 DIAGNOSIS — L237 Allergic contact dermatitis due to plants, except food: Secondary | ICD-10-CM | POA: Diagnosis not present

## 2021-06-09 DIAGNOSIS — E7800 Pure hypercholesterolemia, unspecified: Secondary | ICD-10-CM | POA: Diagnosis not present

## 2021-06-13 DIAGNOSIS — M62838 Other muscle spasm: Secondary | ICD-10-CM | POA: Diagnosis not present

## 2021-06-13 DIAGNOSIS — M25331 Other instability, right wrist: Secondary | ICD-10-CM | POA: Diagnosis not present

## 2021-06-13 DIAGNOSIS — R3912 Poor urinary stream: Secondary | ICD-10-CM | POA: Diagnosis not present

## 2021-06-13 DIAGNOSIS — M6281 Muscle weakness (generalized): Secondary | ICD-10-CM | POA: Diagnosis not present

## 2021-06-22 DIAGNOSIS — M25331 Other instability, right wrist: Secondary | ICD-10-CM | POA: Diagnosis not present

## 2021-06-22 DIAGNOSIS — M19131 Post-traumatic osteoarthritis, right wrist: Secondary | ICD-10-CM | POA: Diagnosis not present

## 2021-06-24 DIAGNOSIS — M25331 Other instability, right wrist: Secondary | ICD-10-CM | POA: Diagnosis not present

## 2021-06-30 DIAGNOSIS — S51031A Puncture wound without foreign body of right elbow, initial encounter: Secondary | ICD-10-CM | POA: Diagnosis not present

## 2021-06-30 DIAGNOSIS — M25532 Pain in left wrist: Secondary | ICD-10-CM | POA: Diagnosis not present

## 2021-06-30 DIAGNOSIS — Z23 Encounter for immunization: Secondary | ICD-10-CM | POA: Diagnosis not present

## 2021-06-30 DIAGNOSIS — T148XXA Other injury of unspecified body region, initial encounter: Secondary | ICD-10-CM | POA: Diagnosis not present

## 2021-06-30 DIAGNOSIS — S80811A Abrasion, right lower leg, initial encounter: Secondary | ICD-10-CM | POA: Diagnosis not present

## 2021-06-30 DIAGNOSIS — M7989 Other specified soft tissue disorders: Secondary | ICD-10-CM | POA: Diagnosis not present

## 2021-06-30 DIAGNOSIS — S80812A Abrasion, left lower leg, initial encounter: Secondary | ICD-10-CM | POA: Diagnosis not present

## 2021-06-30 DIAGNOSIS — R600 Localized edema: Secondary | ICD-10-CM | POA: Diagnosis not present

## 2021-06-30 DIAGNOSIS — S5001XA Contusion of right elbow, initial encounter: Secondary | ICD-10-CM | POA: Diagnosis not present

## 2021-07-01 DIAGNOSIS — M25532 Pain in left wrist: Secondary | ICD-10-CM | POA: Diagnosis not present

## 2021-07-04 DIAGNOSIS — M6281 Muscle weakness (generalized): Secondary | ICD-10-CM | POA: Diagnosis not present

## 2021-07-04 DIAGNOSIS — S76311A Strain of muscle, fascia and tendon of the posterior muscle group at thigh level, right thigh, initial encounter: Secondary | ICD-10-CM | POA: Diagnosis not present

## 2021-07-04 DIAGNOSIS — M62838 Other muscle spasm: Secondary | ICD-10-CM | POA: Diagnosis not present

## 2021-07-04 DIAGNOSIS — R3912 Poor urinary stream: Secondary | ICD-10-CM | POA: Diagnosis not present

## 2021-07-08 DIAGNOSIS — J329 Chronic sinusitis, unspecified: Secondary | ICD-10-CM | POA: Diagnosis not present

## 2021-07-08 DIAGNOSIS — W11XXXA Fall on and from ladder, initial encounter: Secondary | ICD-10-CM | POA: Diagnosis not present

## 2021-07-08 DIAGNOSIS — J3489 Other specified disorders of nose and nasal sinuses: Secondary | ICD-10-CM | POA: Diagnosis not present

## 2021-07-08 DIAGNOSIS — R0982 Postnasal drip: Secondary | ICD-10-CM | POA: Diagnosis not present

## 2021-07-08 DIAGNOSIS — S40021A Contusion of right upper arm, initial encounter: Secondary | ICD-10-CM | POA: Diagnosis not present

## 2021-07-11 DIAGNOSIS — T148XXA Other injury of unspecified body region, initial encounter: Secondary | ICD-10-CM | POA: Diagnosis not present

## 2021-07-11 DIAGNOSIS — S5001XA Contusion of right elbow, initial encounter: Secondary | ICD-10-CM | POA: Diagnosis not present

## 2021-07-11 DIAGNOSIS — L03113 Cellulitis of right upper limb: Secondary | ICD-10-CM | POA: Diagnosis not present

## 2021-07-12 DIAGNOSIS — R531 Weakness: Secondary | ICD-10-CM | POA: Diagnosis not present

## 2021-07-12 DIAGNOSIS — R2689 Other abnormalities of gait and mobility: Secondary | ICD-10-CM | POA: Diagnosis not present

## 2021-07-12 DIAGNOSIS — M79604 Pain in right leg: Secondary | ICD-10-CM | POA: Diagnosis not present

## 2021-07-13 ENCOUNTER — Other Ambulatory Visit: Payer: Self-pay

## 2021-07-13 ENCOUNTER — Ambulatory Visit (INDEPENDENT_AMBULATORY_CARE_PROVIDER_SITE_OTHER): Payer: Medicare Other | Admitting: Sports Medicine

## 2021-07-13 ENCOUNTER — Encounter: Payer: Self-pay | Admitting: Sports Medicine

## 2021-07-13 DIAGNOSIS — M79674 Pain in right toe(s): Secondary | ICD-10-CM

## 2021-07-13 DIAGNOSIS — L603 Nail dystrophy: Secondary | ICD-10-CM

## 2021-07-13 DIAGNOSIS — Z9889 Other specified postprocedural states: Secondary | ICD-10-CM | POA: Diagnosis not present

## 2021-07-13 NOTE — Progress Notes (Signed)
Subjective: Ryan [name] is a 60 y.o. male patient returns to office today for follow up evaluation after having Right hallux total permanent nail avulsion performed on (03-14-19). Patient reports that he wanted to have the toe checked again had a little fleck of skin/nail the other day.  Patient deniesfever/chills/nausea/vomitting/any other related constitutional symptoms at this time.   Reports that he injured his hand after falling off ladder.  Patient Active Problem List   Diagnosis Date Noted   Flank pain 12/10/2019   Sacrococcygeal pain 10/24/2019   Left lumbar radiculopathy 03/22/2018   Post-void dribbling 01/16/2017   Urethral stricture 01/16/2017   Urge incontinence 10/07/2014   OCD (obsessive compulsive disorder) 10/01/2013   BPH (benign prostatic hyperplasia) 06/23/2013   Overactive bladder 06/13/2012    Current Outpatient Medications on File Prior to Visit  Medication Sig Dispense Refill   fluticasone (FLONASE) 50 MCG/ACT nasal spray Place into both nostrils daily.     fluvoxaMINE (LUVOX) 25 MG tablet Take 25 mg by mouth at bedtime.     ketoconazole (NIZORAL) 2 % shampoo APPLY 5 TO 10 ML TOPICALLY TO WET HAIR TWICE WEEKLY FOR 4 WEEKS WITH AT LEAST 3 DAYS BETWEEN EACH SHAMPOOING     valACYclovir (VALTREX) 500 MG tablet Take 500 mg by mouth 2 (two) times daily.     No current facility-administered medications on file prior to visit.    No Known Allergies  Objective:  General: Well developed, nourished, in no acute distress, alert and oriented x3   Dermatology: Skin is warm, dry and supple bilateral. Right hallux nail bed appears to be clean, dry, with callus at the medial nail fold versus a small nail particle at the medial nail fold. (-) Erythema. (-) Edema. (-) serosanguous drainage present. The remaining nails appear unremarkable at this time. There is a callus noted to the medial aspect of the right great toe with no signs of infection.  Neurovascular status: Intact.  No lower extremity swelling; No pain with calf compression bilateral.  Musculoskeletal: There no tenderness to palpation of the Right hallux nail bed. Muscular strength within normal limits bilateral. Mild hallux valgus.   Assesement and Plan: Problem List Items Addressed This Visit   None Visit Diagnoses     S/P nail surgery    -  Primary   Toe pain, right       Nail dystrophy           -Examined patient  -Cleansed right hallux nail bed and using a tissue nipper keratotic skin was removed from the nail fold there is no sign for regrowth of nail however at the nail fold there is a small crease that tends to collect type callus tissue as well as callus noted at the medial aspect of the right great toe which was mechanically debrided and no additional charge at this visit using a sterile chisel blade. -Advised patient to use Vaseline or skin softening cream over the callus areas at the right great toe -Continue with toe spacer for valgus like before -Continue with good supportive shoes that do not rub or irritate toes like before -Patient is to return in PRN or sooner if problems arise.  Landis Martins, DPM

## 2021-07-19 DIAGNOSIS — Z23 Encounter for immunization: Secondary | ICD-10-CM | POA: Diagnosis not present

## 2021-07-20 DIAGNOSIS — M79604 Pain in right leg: Secondary | ICD-10-CM | POA: Diagnosis not present

## 2021-07-20 DIAGNOSIS — R531 Weakness: Secondary | ICD-10-CM | POA: Diagnosis not present

## 2021-07-20 DIAGNOSIS — R2689 Other abnormalities of gait and mobility: Secondary | ICD-10-CM | POA: Diagnosis not present

## 2021-07-27 DIAGNOSIS — M79604 Pain in right leg: Secondary | ICD-10-CM | POA: Diagnosis not present

## 2021-07-27 DIAGNOSIS — R531 Weakness: Secondary | ICD-10-CM | POA: Diagnosis not present

## 2021-07-27 DIAGNOSIS — R2689 Other abnormalities of gait and mobility: Secondary | ICD-10-CM | POA: Diagnosis not present

## 2021-08-01 DIAGNOSIS — R2689 Other abnormalities of gait and mobility: Secondary | ICD-10-CM | POA: Diagnosis not present

## 2021-08-01 DIAGNOSIS — J329 Chronic sinusitis, unspecified: Secondary | ICD-10-CM | POA: Diagnosis not present

## 2021-08-01 DIAGNOSIS — J342 Deviated nasal septum: Secondary | ICD-10-CM | POA: Diagnosis not present

## 2021-08-01 DIAGNOSIS — M79604 Pain in right leg: Secondary | ICD-10-CM | POA: Diagnosis not present

## 2021-08-01 DIAGNOSIS — R531 Weakness: Secondary | ICD-10-CM | POA: Diagnosis not present

## 2021-08-01 DIAGNOSIS — J320 Chronic maxillary sinusitis: Secondary | ICD-10-CM | POA: Diagnosis not present

## 2021-08-04 DIAGNOSIS — J300 Vasomotor rhinitis: Secondary | ICD-10-CM | POA: Diagnosis not present

## 2021-08-04 DIAGNOSIS — R519 Headache, unspecified: Secondary | ICD-10-CM | POA: Diagnosis not present

## 2021-08-04 DIAGNOSIS — Z9889 Other specified postprocedural states: Secondary | ICD-10-CM | POA: Diagnosis not present

## 2021-08-04 DIAGNOSIS — Z87898 Personal history of other specified conditions: Secondary | ICD-10-CM | POA: Diagnosis not present

## 2021-08-04 DIAGNOSIS — J320 Chronic maxillary sinusitis: Secondary | ICD-10-CM | POA: Diagnosis not present

## 2021-08-08 DIAGNOSIS — R2689 Other abnormalities of gait and mobility: Secondary | ICD-10-CM | POA: Diagnosis not present

## 2021-08-08 DIAGNOSIS — M79604 Pain in right leg: Secondary | ICD-10-CM | POA: Diagnosis not present

## 2021-08-08 DIAGNOSIS — R531 Weakness: Secondary | ICD-10-CM | POA: Diagnosis not present

## 2021-08-10 DIAGNOSIS — M19031 Primary osteoarthritis, right wrist: Secondary | ICD-10-CM | POA: Diagnosis not present

## 2021-08-10 DIAGNOSIS — M25331 Other instability, right wrist: Secondary | ICD-10-CM | POA: Diagnosis not present

## 2021-08-10 DIAGNOSIS — M25521 Pain in right elbow: Secondary | ICD-10-CM | POA: Diagnosis not present

## 2021-08-10 DIAGNOSIS — M19131 Post-traumatic osteoarthritis, right wrist: Secondary | ICD-10-CM | POA: Diagnosis not present

## 2021-08-10 DIAGNOSIS — R2231 Localized swelling, mass and lump, right upper limb: Secondary | ICD-10-CM | POA: Diagnosis not present

## 2021-08-15 DIAGNOSIS — S5001XA Contusion of right elbow, initial encounter: Secondary | ICD-10-CM | POA: Diagnosis not present

## 2021-08-15 DIAGNOSIS — S76311A Strain of muscle, fascia and tendon of the posterior muscle group at thigh level, right thigh, initial encounter: Secondary | ICD-10-CM | POA: Diagnosis not present

## 2021-08-16 DIAGNOSIS — J069 Acute upper respiratory infection, unspecified: Secondary | ICD-10-CM | POA: Diagnosis not present

## 2021-08-16 DIAGNOSIS — U071 COVID-19: Secondary | ICD-10-CM | POA: Diagnosis not present

## 2021-08-16 DIAGNOSIS — R051 Acute cough: Secondary | ICD-10-CM | POA: Diagnosis not present

## 2021-08-23 DIAGNOSIS — R059 Cough, unspecified: Secondary | ICD-10-CM | POA: Diagnosis not present

## 2021-08-23 DIAGNOSIS — J040 Acute laryngitis: Secondary | ICD-10-CM | POA: Diagnosis not present

## 2021-08-23 DIAGNOSIS — U071 COVID-19: Secondary | ICD-10-CM | POA: Diagnosis not present

## 2021-08-23 DIAGNOSIS — U099 Post covid-19 condition, unspecified: Secondary | ICD-10-CM | POA: Diagnosis not present

## 2021-08-23 DIAGNOSIS — R490 Dysphonia: Secondary | ICD-10-CM | POA: Diagnosis not present

## 2021-08-23 DIAGNOSIS — J384 Edema of larynx: Secondary | ICD-10-CM | POA: Diagnosis not present

## 2021-08-24 DIAGNOSIS — M533 Sacrococcygeal disorders, not elsewhere classified: Secondary | ICD-10-CM | POA: Diagnosis not present

## 2021-08-24 DIAGNOSIS — S76311A Strain of muscle, fascia and tendon of the posterior muscle group at thigh level, right thigh, initial encounter: Secondary | ICD-10-CM | POA: Diagnosis not present

## 2021-08-24 DIAGNOSIS — M1611 Unilateral primary osteoarthritis, right hip: Secondary | ICD-10-CM | POA: Diagnosis not present

## 2021-09-06 DIAGNOSIS — S76311A Strain of muscle, fascia and tendon of the posterior muscle group at thigh level, right thigh, initial encounter: Secondary | ICD-10-CM | POA: Diagnosis not present

## 2021-09-12 DIAGNOSIS — L239 Allergic contact dermatitis, unspecified cause: Secondary | ICD-10-CM | POA: Diagnosis not present

## 2021-09-13 ENCOUNTER — Ambulatory Visit: Payer: Medicare HMO | Admitting: Orthopaedic Surgery

## 2021-09-21 DIAGNOSIS — M19032 Primary osteoarthritis, left wrist: Secondary | ICD-10-CM | POA: Diagnosis not present

## 2021-09-21 DIAGNOSIS — M25532 Pain in left wrist: Secondary | ICD-10-CM | POA: Diagnosis not present

## 2021-09-21 DIAGNOSIS — S6982XA Other specified injuries of left wrist, hand and finger(s), initial encounter: Secondary | ICD-10-CM | POA: Diagnosis not present

## 2021-09-30 DIAGNOSIS — I872 Venous insufficiency (chronic) (peripheral): Secondary | ICD-10-CM | POA: Diagnosis not present

## 2021-09-30 DIAGNOSIS — I87391 Chronic venous hypertension (idiopathic) with other complications of right lower extremity: Secondary | ICD-10-CM | POA: Diagnosis not present

## 2021-10-10 DIAGNOSIS — R059 Cough, unspecified: Secondary | ICD-10-CM | POA: Diagnosis not present

## 2021-11-03 DIAGNOSIS — I83811 Varicose veins of right lower extremities with pain: Secondary | ICD-10-CM | POA: Diagnosis not present

## 2021-11-03 DIAGNOSIS — M7989 Other specified soft tissue disorders: Secondary | ICD-10-CM | POA: Diagnosis not present

## 2021-11-18 DIAGNOSIS — R3912 Poor urinary stream: Secondary | ICD-10-CM | POA: Diagnosis not present

## 2021-11-18 DIAGNOSIS — M62838 Other muscle spasm: Secondary | ICD-10-CM | POA: Diagnosis not present

## 2021-11-18 DIAGNOSIS — M6281 Muscle weakness (generalized): Secondary | ICD-10-CM | POA: Diagnosis not present

## 2021-12-05 DIAGNOSIS — M62838 Other muscle spasm: Secondary | ICD-10-CM | POA: Diagnosis not present

## 2021-12-05 DIAGNOSIS — M6281 Muscle weakness (generalized): Secondary | ICD-10-CM | POA: Diagnosis not present

## 2021-12-15 DIAGNOSIS — R300 Dysuria: Secondary | ICD-10-CM | POA: Diagnosis not present

## 2021-12-20 DIAGNOSIS — M62838 Other muscle spasm: Secondary | ICD-10-CM | POA: Diagnosis not present

## 2021-12-20 DIAGNOSIS — M6281 Muscle weakness (generalized): Secondary | ICD-10-CM | POA: Diagnosis not present

## 2021-12-22 DIAGNOSIS — D72819 Decreased white blood cell count, unspecified: Secondary | ICD-10-CM | POA: Diagnosis not present

## 2021-12-29 DIAGNOSIS — M6281 Muscle weakness (generalized): Secondary | ICD-10-CM | POA: Diagnosis not present

## 2021-12-29 DIAGNOSIS — M62838 Other muscle spasm: Secondary | ICD-10-CM | POA: Diagnosis not present

## 2021-12-29 DIAGNOSIS — N3281 Overactive bladder: Secondary | ICD-10-CM | POA: Diagnosis not present

## 2022-01-03 ENCOUNTER — Other Ambulatory Visit: Payer: Self-pay | Admitting: Hematology and Oncology

## 2022-01-03 ENCOUNTER — Inpatient Hospital Stay: Payer: Medicare Other | Attending: Hematology and Oncology | Admitting: Hematology and Oncology

## 2022-01-03 ENCOUNTER — Inpatient Hospital Stay: Payer: Medicare Other

## 2022-01-03 ENCOUNTER — Other Ambulatory Visit: Payer: Self-pay

## 2022-01-03 ENCOUNTER — Encounter: Payer: Self-pay | Admitting: Hematology and Oncology

## 2022-01-03 VITALS — BP 106/61 | HR 82 | Temp 98.5°F | Resp 18 | Ht 72.6 in | Wt 157.6 lb

## 2022-01-03 DIAGNOSIS — Z9079 Acquired absence of other genital organ(s): Secondary | ICD-10-CM | POA: Diagnosis not present

## 2022-01-03 DIAGNOSIS — M25552 Pain in left hip: Secondary | ICD-10-CM | POA: Insufficient documentation

## 2022-01-03 DIAGNOSIS — R634 Abnormal weight loss: Secondary | ICD-10-CM

## 2022-01-03 DIAGNOSIS — Z79899 Other long term (current) drug therapy: Secondary | ICD-10-CM | POA: Insufficient documentation

## 2022-01-03 DIAGNOSIS — M549 Dorsalgia, unspecified: Secondary | ICD-10-CM | POA: Diagnosis not present

## 2022-01-03 DIAGNOSIS — N3281 Overactive bladder: Secondary | ICD-10-CM | POA: Diagnosis not present

## 2022-01-03 DIAGNOSIS — D649 Anemia, unspecified: Secondary | ICD-10-CM

## 2022-01-03 DIAGNOSIS — Z809 Family history of malignant neoplasm, unspecified: Secondary | ICD-10-CM | POA: Diagnosis not present

## 2022-01-03 DIAGNOSIS — M545 Low back pain, unspecified: Secondary | ICD-10-CM

## 2022-01-03 DIAGNOSIS — Z823 Family history of stroke: Secondary | ICD-10-CM | POA: Diagnosis not present

## 2022-01-03 DIAGNOSIS — Z87891 Personal history of nicotine dependence: Secondary | ICD-10-CM | POA: Insufficient documentation

## 2022-01-03 DIAGNOSIS — N401 Enlarged prostate with lower urinary tract symptoms: Secondary | ICD-10-CM | POA: Diagnosis not present

## 2022-01-03 DIAGNOSIS — M25551 Pain in right hip: Secondary | ICD-10-CM | POA: Diagnosis not present

## 2022-01-03 DIAGNOSIS — D72818 Other decreased white blood cell count: Secondary | ICD-10-CM

## 2022-01-03 DIAGNOSIS — Z8249 Family history of ischemic heart disease and other diseases of the circulatory system: Secondary | ICD-10-CM | POA: Insufficient documentation

## 2022-01-03 DIAGNOSIS — R32 Unspecified urinary incontinence: Secondary | ICD-10-CM | POA: Diagnosis not present

## 2022-01-03 DIAGNOSIS — N35919 Unspecified urethral stricture, male, unspecified site: Secondary | ICD-10-CM | POA: Insufficient documentation

## 2022-01-03 DIAGNOSIS — Z800 Family history of malignant neoplasm of digestive organs: Secondary | ICD-10-CM | POA: Diagnosis not present

## 2022-01-03 DIAGNOSIS — D72819 Decreased white blood cell count, unspecified: Secondary | ICD-10-CM | POA: Insufficient documentation

## 2022-01-03 DIAGNOSIS — F429 Obsessive-compulsive disorder, unspecified: Secondary | ICD-10-CM | POA: Insufficient documentation

## 2022-01-03 DIAGNOSIS — N3941 Urge incontinence: Secondary | ICD-10-CM | POA: Diagnosis not present

## 2022-01-03 DIAGNOSIS — Z818 Family history of other mental and behavioral disorders: Secondary | ICD-10-CM | POA: Diagnosis not present

## 2022-01-03 DIAGNOSIS — Z803 Family history of malignant neoplasm of breast: Secondary | ICD-10-CM | POA: Diagnosis not present

## 2022-01-03 DIAGNOSIS — Z808 Family history of malignant neoplasm of other organs or systems: Secondary | ICD-10-CM | POA: Insufficient documentation

## 2022-01-03 DIAGNOSIS — F419 Anxiety disorder, unspecified: Secondary | ICD-10-CM

## 2022-01-03 LAB — COMPREHENSIVE METABOLIC PANEL
Albumin: 4.4 (ref 3.5–5.0)
Calcium: 9.2 (ref 8.7–10.7)

## 2022-01-03 LAB — BASIC METABOLIC PANEL
BUN: 14 (ref 4–21)
CO2: 28 — AB (ref 13–22)
Chloride: 102 (ref 99–108)
Creatinine: 0.8 (ref 0.6–1.3)
Glucose: 73
Potassium: 4.1 (ref 3.4–5.3)
Sodium: 136 — AB (ref 137–147)

## 2022-01-03 LAB — IRON AND TIBC
Iron: 22 ug/dL — ABNORMAL LOW (ref 45–182)
Saturation Ratios: 7 % — ABNORMAL LOW (ref 17.9–39.5)
TIBC: 308 ug/dL (ref 250–450)
UIBC: 286 ug/dL

## 2022-01-03 LAB — FERRITIN: Ferritin: 81 ng/mL (ref 24–336)

## 2022-01-03 LAB — CBC AND DIFFERENTIAL
HCT: 43 (ref 41–53)
Hemoglobin: 14.7 (ref 13.5–17.5)
Neutrophils Absolute: 7.74
Platelets: 235 (ref 150–399)
WBC: 9.1

## 2022-01-03 LAB — CBC
Absolute Lymphocytes: 0.64 — AB (ref 0.65–4.75)
MCV: 96 — AB (ref 80–94)
RBC: 4.52 (ref 3.87–5.11)

## 2022-01-03 LAB — HEPATIC FUNCTION PANEL
ALT: 21 (ref 10–40)
AST: 35 (ref 14–40)
Alkaline Phosphatase: 43 (ref 25–125)
Bilirubin, Total: 1.1

## 2022-01-03 LAB — VITAMIN B12: Vitamin B-12: 636 pg/mL (ref 180–914)

## 2022-01-03 LAB — FOLATE: Folate: 15.5 ng/mL (ref >5.9)

## 2022-01-03 NOTE — Progress Notes (Signed)
[place]  [address] [place],    [address] [phone]  Clinic Day:  01/03/2022  Referring physician: Townsend Roger, MD   REASON FOR CONSULTATION:  Leukopenia  HISTORY OF PRESENT ILLNESS:  Ryan [name] is a 61 y.o. male with a history of borderline leukopenia apparently since 2011 who is referred in consultation by Dr. Manson Allan for assessment and management.  Most recently, patient was seen at a local urgent care and was thought to possibly have prostatitis.  CBC without differential revealed a total white count of 3.1, hemoglobin 13.3, with an MCV 95 and platelets 208,000.  He saw Dr. Manson Allan for follow-up.  He did not feel he had prostatitis.  As the patient had previously leukopenia, he was referred to [place].  The patient denies any sign of recent infection.  He feels generally well except for his hip pain.  He states that he has a bulging lumbar disc.  He takes multiple vitamin supplements.  He is a former smoker but smoked only 1 pack/week.  He drinks alcohol about once a week.  The white blood cell count was 4.1 with a normal differential and normal hemoglobin and platelets in March 2022.    The white blood count was 3.3 with an ANC 1.2 with a normal differential and normal hemoglobin and platelets in March 2021.  Review of his hospital records revealed a white blood cell count of 5 with a normal differential and borderline low hemoglobin of 13.3 with an MCV 95, normal platelets in July 2014.  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  He has BPH and has had TURP x 2.  He also reports a urethral stricture, overactive bladder and urge incontinence.  He is undergoing physical therapy for the incontinence.  He has OCD but is not currently on any medication.  REVIEW OF SYSTEMS:  Review of Systems  Constitutional:  Positive for unexpected weight change (Decreased from 163lb to 157.6lb earlier this month per  patient). Negative for appetite change, chills, fatigue and fever.  HENT:   Negative for lump/mass, mouth sores and sore throat.   Respiratory:  Negative for cough and shortness of breath.   Cardiovascular:  Negative for chest pain and leg swelling.  Gastrointestinal:  Negative for abdominal pain, constipation, diarrhea, nausea and vomiting.  Genitourinary:  Positive for bladder incontinence (Status post TURP x2). Negative for difficulty urinating, dysuria, frequency and hematuria.   Musculoskeletal:  Positive for arthralgias (Bilateral hip pain) and back pain (Low back). Negative for myalgias.  Skin:  Negative for itching, rash and wound.  Neurological:  Negative for dizziness, extremity weakness, headaches, light-headedness and numbness.  Hematological:  Negative for adenopathy.  Psychiatric/Behavioral:  Negative for depression and sleep disturbance. The patient is nervous/anxious (OCD, not on medication).     VITALS:  Blood pressure 106/61, pulse 82, temperature 98.5 F (36.9 C), temperature source Oral, resp. rate 18, height 6' 0.6" (1.844 m), weight 157 lb 9.6 oz (71.5 kg), SpO2 95 %.  Wt Readings from Last 3 Encounters:  01/03/22 157 lb 9.6 oz (71.5 kg)  10/31/18 170 lb (77.1 kg)  04/29/18 163 lb (73.9 kg)    Body mass index is 21.02 kg/m.  Performance status (ECOG): 1 - Symptomatic but completely ambulatory  PHYSICAL EXAM:  Physical Exam Vitals and nursing note reviewed.  Constitutional:      General: He is not in acute distress.    Appearance: Normal appearance.  He is normal weight.  HENT:     Head: Normocephalic and atraumatic.     Mouth/Throat:     Mouth: Mucous membranes are moist.     Pharynx: Oropharynx is clear. No oropharyngeal exudate or posterior oropharyngeal erythema.  Eyes:     General: No scleral icterus.    Extraocular Movements: Extraocular movements intact.     Conjunctiva/sclera: Conjunctivae normal.     Pupils: Pupils are equal, round, and reactive to  light.  Cardiovascular:     Rate and Rhythm: Normal rate and regular rhythm.     Heart sounds: Normal heart sounds. No murmur heard.   No friction rub. No gallop.  Pulmonary:     Effort: Pulmonary effort is normal.     Breath sounds: Normal breath sounds. No wheezing, rhonchi or rales.  Abdominal:     General: Bowel sounds are normal. There is no distension.     Palpations: Abdomen is soft. There is no hepatomegaly, splenomegaly or mass.     Tenderness: There is no abdominal tenderness.  Musculoskeletal:        General: Normal range of motion.     Cervical back: Normal range of motion and neck supple. No tenderness.     Right lower leg: No edema.     Left lower leg: No edema.  Lymphadenopathy:     Cervical: No cervical adenopathy.     Upper Body:     Right upper body: No supraclavicular or axillary adenopathy.     Left upper body: No supraclavicular or axillary adenopathy.     Lower Body: No right inguinal adenopathy. No left inguinal adenopathy.  Skin:    General: Skin is warm and dry.     Coloration: Skin is not jaundiced.     Findings: No rash.  Neurological:     Mental Status: He is alert and oriented to person, place, and time.     Cranial Nerves: No cranial nerve deficit.  Psychiatric:        Mood and Affect: Mood normal.        Behavior: Behavior normal.        Thought Content: Thought content normal.     LABS:   CBC Latest Ref Rng & Units 01/03/2022 04/30/2018 04/29/2018  WBC - 9.1 4.0 3.5(L)  Hemoglobin 13.5 - 17.5 14.7 13.2 14.1  Hematocrit 41 - 53 43 38.9(L) 41.7  Platelets 150 - 399 235 145(L) 166   CMP Latest Ref Rng & Units 01/03/2022 04/30/2018 04/29/2018  Glucose 65 - 99 mg/dL - 109(H) 149(H)  BUN 4 - _0 Creatinine 0.6 - 1.3 0.8 1.10 1.07  Sodium 137 - 147 136(A) 136 137  Potassium 3.4 - 5.3 4.1 4.3 4.3  Chloride 99 - 108 102 100(L) 100(L)  CO2 13 - 22 28(A) 29 29  Calcium 8.7 - 10.7 9.2 8.6(L) 8.7(L)  Total Protein 6.5 - 8.1 g/dL - - -   Total Bilirubin 0.3 - 1.2 mg/dL - - -  Alkaline Phos 25 - 125 43 - -  AST 14 - 40 35 - -  ALT 10 - 40 21 - -   B12 and folate are normal.  No results found for: CEA1 / No results found for: CEA1 No results found for: PSA1 No results found for: RCV818 No results found for: MCR754  No results found for: TOTALPROTELP, ALBUMINELP, A1GS, A2GS, BETS, BETA2SER, GAMS, MSPIKE, SPEI Lab Results  Component Value Date   TIBC 308 01/03/2022  FERRITIN 81 01/03/2022   IRONPCTSAT 7 (L) 01/03/2022   Soluble transferrin was low at 10.3. No results found for: LDH  STUDIES:  No results found.    HISTORY:   Past Medical History:  Diagnosis Date   Anemia    hx   Anxiety    Arthritis    "joints of fingers; ankles" (04/29/2018)   BPH (benign prostatic hyperplasia)    Bulging lumbar disc dx'd 06/2016   Cellulitis of leg, right 04/28/2018   Chronic sinusitis    Daily headache    "daily lately; at least weekly" (04/29/2018)   GERD (gastroesophageal reflux disease)    Hepatitis C ~ 2000   "turned yellow; jaundiced; not sure which kind of hepatitis" (04/29/2018)   IBS (irritable bowel syndrome)    Left lumbar radiculopathy    OCD (obsessive compulsive disorder)    OCD (obsessive compulsive disorder)    Overactive bladder    Reiter's arthritis involving lower leg, unspecified laterality ([place])    Reiter's syndrome    Sacrococcygeal pain    Urethral stricture    Urge incontinence     Past Surgical History:  Procedure Laterality Date   CLOSED REDUCTION TOE FRACTURE Left 05/2014   "titanium plate in" 2nd digit   FRACTURE SURGERY     INGUINAL HERNIA REPAIR Left 06/2008   NASAL SEPTUM SURGERY  2016   PROSTATE SURGERY  2015   transurethral laser-induced prostatectomy   RHINOPLASTY  2018   TONSILLECTOMY AND ADENOIDECTOMY     TRANSURETHRAL RESECTION OF PROSTATE      Family History  Problem Relation Age of Onset   Stroke Mother    Allergic rhinitis Mother    Arrhythmia Mother     Stroke Father    Alzheimer's disease Father    Breast cancer Maternal Grandmother    Stomach cancer Maternal Grandfather    Skin cancer Maternal Grandfather    Cowden syndrome Other    Cancer Cousin     Social History:  reports that he quit smoking about 10 years ago. His smoking use included cigarettes. He has a 3.12 pack-year smoking history. He has never used smokeless tobacco. He reports current alcohol use of about 1.0 standard drink per week. He reports that he does not currently use drugs after having used the following drugs: Cocaine and Marijuana.The patient is alone  today.  He was born and raised in [place], but has moved around in the past.  He is not married and has no children.  He lives by himself. 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  Allergies:  Allergies  Allergen Reactions   Pollen Extract Other (See Comments)   Short Ragweed Pollen Ext Other (See Comments)    sneezing sneezing    Iodinated Contrast Media Rash    Flushing  Flushing      Current Medications: Current Outpatient Medications  Medication Sig Dispense Refill   ASHWAGANDHA PO Take 1 tablet by mouth daily.     co-enzyme Q-10 30 MG capsule Take 30 mg by mouth daily.     Emollient (COLLAGEN EX) Take 1 tablet by mouth daily.     Misc Natural Products (MAGIC MUSHROOM MIX) CAPS Take 1 tablet by mouth daily.     Misc Natural Products (NF FORMULAS TESTOSTERONE) CAPS Take 1 tablet by mouth daily.     Misc Natural Products (PROSTATE) CAPS Take 1 tablet by mouth daily.     Multiple Vitamin (MULTIVITAMIN WITH MINERALS) TABS tablet Take  1 tablet by mouth daily.     Probiotic Product (CULTURELLE PROBIOTICS PO) Take 1 tablet by mouth daily.     Red Yeast Rice Extract (RED YEAST RICE PO) Take 1 tablet by mouth daily.     Turmeric (QC TUMERIC COMPLEX) 500 MG CAPS Take 1 tablet by mouth daily.     fluticasone (FLONASE) 50 MCG/ACT nasal spray Place into both nostrils daily.      tamsulosin (FLOMAX) 0.4 MG CAPS capsule Take 0.4 mg by mouth daily.     valACYclovir (VALTREX) 1000 MG tablet Take 4,000 mg by mouth once.     No current facility-administered medications for this visit.     ASSESSMENT & PLAN:   Assessment/Plan:  WENCESLAUS GIST is a 61 y.o. male with intermittent borderline leukopenia, which may have been due to infection or medications or simply a normal variant.  His white blood cell count is normal today.  There is predominance of neutrophils, but a normal ANC.  He also had borderline macrocytic anemia.  B12 and folate were normal.  Iron studies were equivocal, so soluble transferrin was added and was low, which is inconsistent with iron deficiency.  We simply recommend a CBC 1-2 times a year to follow for recurrent or worsening leukopenia.  We would be glad to see the patient back in the future as needed.  I discussed the assessment and recommendation with the patient.  The patient was provided an opportunity to ask questions and all were answered.  The patient agreed with the plan and demonstrated an understanding of the instructions.  The patient was advised to call back if the symptoms worsen or if the condition fails to improve as anticipated.  Thank you for the opportunity this lovely gentleman.   I provided 40 minutes of face-to-face time during this encounter and > 50% was spent counseling as documented under my assessment and plan.    Marvia Pickles, PA-C

## 2022-01-04 ENCOUNTER — Encounter: Payer: Self-pay | Admitting: Hematology and Oncology

## 2022-01-04 ENCOUNTER — Other Ambulatory Visit: Payer: Self-pay

## 2022-01-04 DIAGNOSIS — D72819 Decreased white blood cell count, unspecified: Secondary | ICD-10-CM

## 2022-01-09 ENCOUNTER — Other Ambulatory Visit: Payer: Self-pay | Admitting: Hematology and Oncology

## 2022-01-09 ENCOUNTER — Encounter: Payer: Self-pay | Admitting: Hematology and Oncology

## 2022-01-09 DIAGNOSIS — R3912 Poor urinary stream: Secondary | ICD-10-CM | POA: Diagnosis not present

## 2022-01-09 DIAGNOSIS — M62838 Other muscle spasm: Secondary | ICD-10-CM | POA: Diagnosis not present

## 2022-01-09 DIAGNOSIS — M6281 Muscle weakness (generalized): Secondary | ICD-10-CM | POA: Diagnosis not present

## 2022-01-09 LAB — SOLUBLE TRANSFERRIN RECEPTOR: Transferrin Receptor, Soluble: 10.3

## 2022-01-12 ENCOUNTER — Other Ambulatory Visit: Payer: Self-pay | Admitting: Genetic Counselor

## 2022-01-12 DIAGNOSIS — Z803 Family history of malignant neoplasm of breast: Secondary | ICD-10-CM

## 2022-01-13 ENCOUNTER — Inpatient Hospital Stay: Payer: Medicare Other

## 2022-01-13 ENCOUNTER — Other Ambulatory Visit: Payer: Self-pay

## 2022-01-13 ENCOUNTER — Inpatient Hospital Stay: Payer: Medicare Other | Attending: Hematology and Oncology | Admitting: Genetic Counselor

## 2022-01-13 DIAGNOSIS — Z803 Family history of malignant neoplasm of breast: Secondary | ICD-10-CM | POA: Diagnosis not present

## 2022-01-13 DIAGNOSIS — Z800 Family history of malignant neoplasm of digestive organs: Secondary | ICD-10-CM

## 2022-01-16 ENCOUNTER — Encounter: Payer: Self-pay | Admitting: Genetic Counselor

## 2022-01-16 DIAGNOSIS — Z800 Family history of malignant neoplasm of digestive organs: Secondary | ICD-10-CM

## 2022-01-16 DIAGNOSIS — Z803 Family history of malignant neoplasm of breast: Secondary | ICD-10-CM

## 2022-01-16 HISTORY — DX: Family history of malignant neoplasm of breast: Z80.3

## 2022-01-16 HISTORY — DX: Family history of malignant neoplasm of digestive organs: Z80.0

## 2022-01-16 NOTE — Progress Notes (Signed)
REFERRING PROVIDER: Marvia Pickles, PA-C [address] [place],  [place] [address]  PRIMARY PROVIDER:  Townsend Roger, MD  PRIMARY REASON FOR VISIT:  1. Family history of gastric cancer   2. Family history of breast cancer     HISTORY OF PRESENT ILLNESS:   [name], a 61 y.o. male, was seen for a [place] cancer genetics consultation at the request of Rosanne Sack, PA-C due to a family history of cancer and a reported possible family history of a hereditary cancer mutation.  Ryan [name] presents to clinic today to discuss the possibility of a hereditary predisposition to cancer, to discuss genetic testing, and to further clarify his future cancer risks, as well as potential cancer risks for family members.   Ryan [name] is a 61 y.o. male with no personal history of cancer.    CANCER HISTORY:  Oncology History   No history exists.    RISK FACTORS:  Colonoscopy: most recent in 2021;  reports one colon polyp in 2016 . Prostate cancer screening: normal in 202 Dermatology: as needed No history of thyroid screening.   Past Medical History:  Diagnosis Date   Anemia    hx   Anxiety    Arthritis    "joints of fingers; ankles" (04/29/2018)   BPH (benign prostatic hyperplasia)    Bulging lumbar disc dx'd 06/2016   Cellulitis of leg, right 04/28/2018   Chronic sinusitis    Daily headache    "daily lately; at least weekly" (04/29/2018)   Family history of breast cancer 01/16/2022   Family history of gastric cancer 01/16/2022   GERD (gastroesophageal reflux disease)    Hepatitis C ~ 2000   "turned yellow; jaundiced; not sure which kind of hepatitis" (04/29/2018)   IBS (irritable bowel syndrome)    Left lumbar radiculopathy    OCD (obsessive compulsive disorder)    OCD (obsessive compulsive disorder)    Overactive bladder    Reiter's arthritis involving lower leg, unspecified laterality ([place])    Reiter's syndrome    Sacrococcygeal pain    Urethral stricture    Urge incontinence      Past Surgical History:  Procedure Laterality Date   CLOSED REDUCTION TOE FRACTURE Left 05/2014   "titanium plate in" 2nd digit   FRACTURE SURGERY     INGUINAL HERNIA REPAIR Left 06/2008   NASAL SEPTUM SURGERY  2016   PROSTATE SURGERY  2015   transurethral laser-induced prostatectomy   RHINOPLASTY  2018   TONSILLECTOMY AND ADENOIDECTOMY     TRANSURETHRAL RESECTION OF PROSTATE      FAMILY HISTORY:  We obtained a detailed, 4-generation family history.  Significant diagnoses are listed below: Family History  Problem Relation Age of Onset   Cancer Paternal Aunt        unknown type; dx unknown age   Breast cancer Maternal Grandmother        dx late 74s   Stomach cancer Maternal Grandfather        dx 62s   Skin cancer Maternal Grandfather        dx 17s; ear; surgery only     Ryan [name] mentioned that he believes someone in the family might have had genetic testing previously for which he received a letter that he should consider testing as well.  He believes this might have been his paternal uncle.  Chart noted family history of Cowden syndrome; however, Mr. Heppler could not recall if his uncle had Cowden syndrome.   Mr.  [name] is unaware of previous family history of genetic testing for hereditary cancer risks. There is no reported Ashkenazi Jewish ancestry. There is no known consanguinity.  GENETIC COUNSELING ASSESSMENT: Mr. Volante is a 61 y.o. male with a reported family history of a possible hereditary cancer syndrome. We, therefore, discussed and recommended the following at today's visit.   DISCUSSION: We discussed that 5 - 10% of cancer is hereditary.  Hereditary breast, uterine, and thyroid cancer are associated with mutation sin the PTEN gene.  There are other genes that can be associated with hereditary breast or gastric cancer syndromes.  We discussed that testing is beneficial for several reasons, including knowing about other cancer risks, identifying potential  screening and risk-reduction options that may be appropriate, and to understanding if other family members could be at risk for cancer and allowing them to undergo genetic testing.  We reviewed the characteristics, features and inheritance patterns of hereditary cancer syndromes. We also discussed genetic testing, including the appropriate family members to test, the process of testing, insurance coverage and turn-around-time for results. We discussed the implications of a negative, positive, carrier and/or variant of uncertain significant result. We discussed that negative results would be uninformative given that Ryan [name] does not have a personal history of cancer. We recommended Ryan [name] pursue genetic testing for a panel that contains genes associated with breast, gastric, and skin cancers.  The Multi-Cancer + RNA Panel offered by [facility] includes sequencing and/or deletion/duplication analysis of the following 84 genes:  AIP*, ALK, APC*, ATM*, AXIN2*, BAP1*, BARD1*, BLM*, BMPR1A*, BRCA1*, BRCA2*, BRIP1*, CASR, CDC73*, CDH1*, CDK4, CDKN1B*, CDKN1C*, CDKN2A, CEBPA, CHEK2*, CTNNA1*, DICER1*, DIS3L2*, EGFR, EPCAM, FH*, FLCN*, GATA2*, GPC3, GREM1, HOXB13, HRAS, KIT, MAX*, MEN1*, MET, MITF, MLH1*, MSH2*, MSH3*, MSH6*, MUTYH*, NBN*, NF1*, NF2*, NTHL1*, PALB2*, PDGFRA, PHOX2B, PMS2*, POLD1*, POLE*, POT1*, PRKAR1A*, PTCH1*, PTEN*, RAD50*, RAD51C*, RAD51D*, RB1*, RECQL4, RET, RUNX1*, SDHA*, SDHAF2*, SDHB*, SDHC*, SDHD*, SMAD4*, SMARCA4*, SMARCB1*, SMARCE1*, STK11*, SUFU*, TERC, TERT, TMEM127*, Tp53*, TSC1*, TSC2*, VHL*, WRN*, and WT1.  RNA analysis is performed for * genes.  Based on Ryan [name] reported family history of a possible gene mutation, he meets medical criteria for genetic testing. Despite that he meets criteria, he may still have an out of pocket cost.   We discussed that some people do not want to undergo genetic testing due to fear of genetic discrimination.  A federal law called the Genetic  Information Non-Discrimination Act (GINA) of 2008 helps protect individuals against genetic discrimination based on their genetic test results.  It impacts both health insurance and employment.  With health insurance, it protects against increased premiums, being kicked off insurance or being forced to take a test in order to be insured.  For employment it protects against hiring, firing and promoting decisions based on genetic test results.  GINA does not apply to those in the [employer], those who work for companies with less than 15 employees, and new life insurance or long-term disability insurance policies.  Health status due to a cancer diagnosis is not protected under GINA.  PLAN: After considering the risks, benefits, and limitations, Mr. Gaffin provided informed consent to pursue genetic testing and the blood sample was sent to [facility] for analysis of the Multi-Cancer +RNA Panel. Results should be available within approximately 3 weeks' time, at which point they will be disclosed by telephone to Mr. Robben, as will any additional recommendations warranted by these results. Mr. Genrich will receive a summary of his genetic counseling visit and a  copy of his results once available. This information will also be available in Epic.   Lastly, we encouraged Mr. Mcphee to remain in contact with cancer genetics annually so that we can continuously update the family history and inform him of any changes in cancer genetics and testing that may be of benefit for this family.   Mr. Mory questions were answered to his satisfaction today. Our contact information was provided should additional questions or concerns arise. Thank you for the referral and allowing [place] to share in the care of your patient.   Wanna Gully M. Joette Catching, [place], [facility] Genetic Counselor [email or web address] (P) [phone]   The patient was seen for a total of 30 minutes in face-to-face genetic counseling.  The patient was seen  alone.  Drs. Lindi Adie and/or Burr Medico were available to discuss this case as needed.  _______________________________________________________________________ For Office Staff:  Number of people involved in session: 1 Was an Intern/ student involved with case: no

## 2022-01-17 DIAGNOSIS — Z803 Family history of malignant neoplasm of breast: Secondary | ICD-10-CM | POA: Diagnosis not present

## 2022-01-17 DIAGNOSIS — Z800 Family history of malignant neoplasm of digestive organs: Secondary | ICD-10-CM | POA: Diagnosis not present

## 2022-01-17 DIAGNOSIS — Z20822 Contact with and (suspected) exposure to covid-19: Secondary | ICD-10-CM | POA: Diagnosis not present

## 2022-01-25 DIAGNOSIS — M6281 Muscle weakness (generalized): Secondary | ICD-10-CM | POA: Diagnosis not present

## 2022-01-25 DIAGNOSIS — M62838 Other muscle spasm: Secondary | ICD-10-CM | POA: Diagnosis not present

## 2022-01-25 DIAGNOSIS — N3281 Overactive bladder: Secondary | ICD-10-CM | POA: Diagnosis not present

## 2022-02-02 ENCOUNTER — Telehealth: Payer: Self-pay | Admitting: Genetic Counselor

## 2022-02-02 NOTE — Telephone Encounter (Signed)
Patient called to ask about genetics results.  Sample failed in lab and need to draw new sample to get del/dup analysis.  Will proceed with sequencing analysis and wait to hear from lab about submitting new sample.  Interested in having results back before his PCP appt on 3/27.  ?

## 2022-02-06 ENCOUNTER — Telehealth: Payer: Self-pay | Admitting: Genetic Counselor

## 2022-02-06 DIAGNOSIS — G894 Chronic pain syndrome: Secondary | ICD-10-CM | POA: Diagnosis not present

## 2022-02-06 DIAGNOSIS — E7800 Pure hypercholesterolemia, unspecified: Secondary | ICD-10-CM | POA: Diagnosis not present

## 2022-02-06 DIAGNOSIS — K219 Gastro-esophageal reflux disease without esophagitis: Secondary | ICD-10-CM | POA: Diagnosis not present

## 2022-02-06 DIAGNOSIS — Z6820 Body mass index (BMI) 20.0-20.9, adult: Secondary | ICD-10-CM | POA: Diagnosis not present

## 2022-02-06 DIAGNOSIS — N320 Bladder-neck obstruction: Secondary | ICD-10-CM | POA: Diagnosis not present

## 2022-02-06 DIAGNOSIS — Z0000 Encounter for general adult medical examination without abnormal findings: Secondary | ICD-10-CM | POA: Diagnosis not present

## 2022-02-06 DIAGNOSIS — J309 Allergic rhinitis, unspecified: Secondary | ICD-10-CM | POA: Diagnosis not present

## 2022-02-06 DIAGNOSIS — D72819 Decreased white blood cell count, unspecified: Secondary | ICD-10-CM | POA: Diagnosis not present

## 2022-02-06 NOTE — Telephone Encounter (Signed)
Patient was hoping to have genetics results back today to discuss during his PCP appt.  Called him to let him no results are not available and are expected around 4/3.  ?

## 2022-02-07 DIAGNOSIS — M62838 Other muscle spasm: Secondary | ICD-10-CM | POA: Diagnosis not present

## 2022-02-07 DIAGNOSIS — M6281 Muscle weakness (generalized): Secondary | ICD-10-CM | POA: Diagnosis not present

## 2022-02-10 ENCOUNTER — Telehealth: Payer: Self-pay | Admitting: Genetic Counselor

## 2022-02-10 NOTE — Telephone Encounter (Signed)
I attempted to contact Mr. Alicea to discuss his genetic testing results. I left a voicemail requesting he call me back at [phone]. ? ?Lucille Passy, MS, LCGC ?Genetic Counselor ?[email or web address] ?(P) [phone] ? ?

## 2022-02-10 NOTE — Telephone Encounter (Signed)
I contacted Mr. Faulk to discuss his genetic testing results. [facility] Multi-Cancer Panel (84 genes) was Negative. Of note, [facility] only performed sequencing and small insertions/deletions analysis. Loralyn Freshwater, MS, CGC will follow-up with Mr. Harker on 02/15/2022 to schedule a blood draw appointment for analysis of large deletions and duplications (copy number variations). The additional analysis will take approximately 2-3 weeks. ? ?The test report will be scanned into EPIC under the Molecular Pathology section of the Results Review tab.  A portion of the result report is included below for reference.  ? ?Lucille Passy, MS, [place] ?Genetic Counselor ?[email or web address] ?(P) [phone] ? ? ?

## 2022-02-15 ENCOUNTER — Telehealth: Payer: Self-pay | Admitting: Genetic Counselor

## 2022-02-15 NOTE — Telephone Encounter (Signed)
Called patient to attempt to schedule lab draw for genetic testing CNV analysis.  ?

## 2022-02-20 ENCOUNTER — Inpatient Hospital Stay: Payer: Medicare Other | Attending: Hematology and Oncology

## 2022-02-20 ENCOUNTER — Other Ambulatory Visit: Payer: Self-pay | Admitting: Genetic Counselor

## 2022-02-20 ENCOUNTER — Telehealth: Payer: Self-pay | Admitting: Genetic Counselor

## 2022-02-20 DIAGNOSIS — Z803 Family history of malignant neoplasm of breast: Secondary | ICD-10-CM

## 2022-02-20 DIAGNOSIS — N3281 Overactive bladder: Secondary | ICD-10-CM | POA: Diagnosis not present

## 2022-02-20 DIAGNOSIS — M6281 Muscle weakness (generalized): Secondary | ICD-10-CM | POA: Diagnosis not present

## 2022-02-20 DIAGNOSIS — M62838 Other muscle spasm: Secondary | ICD-10-CM | POA: Diagnosis not present

## 2022-02-20 LAB — GENETIC SCREENING ORDER

## 2022-02-20 NOTE — Telephone Encounter (Signed)
Scheduled redraw for genetics 4/10 at 3:30.  ?

## 2022-02-28 DIAGNOSIS — E7800 Pure hypercholesterolemia, unspecified: Secondary | ICD-10-CM | POA: Diagnosis not present

## 2022-02-28 DIAGNOSIS — R634 Abnormal weight loss: Secondary | ICD-10-CM | POA: Diagnosis not present

## 2022-03-01 ENCOUNTER — Telehealth: Payer: Self-pay | Admitting: Genetic Counselor

## 2022-03-01 NOTE — Telephone Encounter (Signed)
Patient called to ask about status of genetic testing.  Returned call to let him know sample is still processing.  Will call results when available (estimated 4/22-5/3) ?

## 2022-03-14 ENCOUNTER — Encounter: Payer: Self-pay | Admitting: Genetic Counselor

## 2022-03-14 ENCOUNTER — Telehealth: Payer: Self-pay | Admitting: Genetic Counselor

## 2022-03-14 DIAGNOSIS — Z1379 Encounter for other screening for genetic and chromosomal anomalies: Secondary | ICD-10-CM | POA: Insufficient documentation

## 2022-03-14 NOTE — Telephone Encounter (Signed)
Contacted patient in attempt to disclose results of genetic testing.  LVM with contact information requesting a call back.  

## 2022-03-15 ENCOUNTER — Telehealth: Payer: Self-pay | Admitting: Genetic Counselor

## 2022-03-15 ENCOUNTER — Ambulatory Visit: Payer: Self-pay | Admitting: Genetic Counselor

## 2022-03-15 DIAGNOSIS — Z1379 Encounter for other screening for genetic and chromosomal anomalies: Secondary | ICD-10-CM

## 2022-03-15 DIAGNOSIS — Z800 Family history of malignant neoplasm of digestive organs: Secondary | ICD-10-CM

## 2022-03-15 DIAGNOSIS — Z803 Family history of malignant neoplasm of breast: Secondary | ICD-10-CM

## 2022-03-15 NOTE — Telephone Encounter (Signed)
Ryan [name] contacted our office to discuss his genetic testing results. No pathogenic variants were identified in the 84 genes analyzed. Detailed clinic note to follow. ? ?The test report has been scanned into EPIC and is located under the Molecular Pathology section of the Results Review tab.  A portion of the result report is included below for reference.  ? ?Lucille Passy, MS, [place] ?Genetic Counselor ?[email or web address] ?(P) [phone] ? ? ? ?

## 2022-03-20 DIAGNOSIS — M6281 Muscle weakness (generalized): Secondary | ICD-10-CM | POA: Diagnosis not present

## 2022-03-20 DIAGNOSIS — N3281 Overactive bladder: Secondary | ICD-10-CM | POA: Diagnosis not present

## 2022-03-20 DIAGNOSIS — M62838 Other muscle spasm: Secondary | ICD-10-CM | POA: Diagnosis not present

## 2022-03-21 DIAGNOSIS — D485 Neoplasm of uncertain behavior of skin: Secondary | ICD-10-CM | POA: Diagnosis not present

## 2022-03-22 NOTE — Progress Notes (Signed)
HPI:   ?Mr. Palardy was previously seen in the [place] clinic due to a family history of cancer and concerns regarding a hereditary predisposition to cancer. Please refer to our prior cancer genetics clinic note for more information regarding our discussion, assessment and recommendations, at the time. Mr. Kirkwood recent genetic test results were disclosed to him, as were recommendations warranted by these results. These results and recommendations are discussed in more detail below. ? ?CANCER HISTORY:  ?Oncology History  ? No history exists.  ? ? ?FAMILY HISTORY:  ?We obtained a detailed, 4-generation family history.  Significant diagnoses are listed below: ?Family History  ?Problem Relation Age of Onset  ? Cancer Paternal Aunt   ?     unknown type; dx unknown age  ? Breast cancer Maternal Grandmother   ?     dx late 27s  ? Stomach cancer Maternal Grandfather   ?     dx 64s  ? Skin cancer Maternal Grandfather   ?     dx 46s; ear; surgery only  ? ?  ?Mr. Beddow mentioned that he believes someone in the family might have had genetic testing previously for which he received a letter that he should consider testing as well.  He believes this might have been his paternal uncle.  Chart noted family history of Cowden syndrome; however, Mr. Rawlinson could not recall if his uncle had Cowden syndrome.  ?  ?Mr. Deman is unaware of previous family history of genetic testing for hereditary cancer risks. There is no reported Ashkenazi Jewish ancestry. There is no known consanguinity. ?  ? ?GENETIC TEST RESULTS:  ?The [facility] Multi-Cancer +RNA Panel found no pathogenic mutations. The Multi-Cancer + RNA Panel offered by [facility] includes sequencing and/or deletion/duplication analysis of the following 84 genes:  AIP*, ALK, APC*, ATM*, AXIN2*, BAP1*, BARD1*, BLM*, BMPR1A*, BRCA1*, BRCA2*, BRIP1*, CASR, CDC73*, CDH1*, CDK4, CDKN1B*, CDKN1C*, CDKN2A, CEBPA, CHEK2*, CTNNA1*, DICER1*, DIS3L2*, EGFR, EPCAM, FH*, FLCN*,  GATA2*, GPC3, GREM1, HOXB13, HRAS, KIT, MAX*, MEN1*, MET, MITF, MLH1*, MSH2*, MSH3*, MSH6*, MUTYH*, NBN*, NF1*, NF2*, NTHL1*, PALB2*, PDGFRA, PHOX2B, PMS2*, POLD1*, POLE*, POT1*, PRKAR1A*, PTCH1*, PTEN*, RAD50*, RAD51C*, RAD51D*, RB1*, RECQL4, RET, RUNX1*, SDHA*, SDHAF2*, SDHB*, SDHC*, SDHD*, SMAD4*, SMARCA4*, SMARCB1*, SMARCE1*, STK11*, SUFU*, TERC, TERT, TMEM127*, Tp53*, TSC1*, TSC2*, VHL*, WRN*, and WT1.  RNA analysis is performed for * genes. ? ?The test report has been scanned into EPIC and is located under the Molecular Pathology section of the Results Review tab.  A portion of the result report is included below for reference. Genetic testing reported out on March 12, 2022.  ? ? ? ? ?Even though a pathogenic variant was not identified, possible explanations for the cancer in the family may include: ?There may be no hereditary risk for cancer in the family. The cancers in Mr. Livecchi family may be sporadic/familial or due to other genetic and environmental factors. ?There may be a gene mutation in one of these genes that current testing methods cannot detect but that chance is small. ?There could be another gene that has not yet been discovered, or that we have not yet tested, that is responsible for the cancer diagnoses in the family.  ?It is also possible there is a hereditary cause for the cancer in the family that Mr. Flavell did not inherit. ? ?Therefore, it is important to remain in touch with cancer genetics in the future so that we can continue to offer Mr. Leppo the most up to date genetic testing.  ? ?  ADDITIONAL GENETIC TESTING:  ?We discussed with Mr. Finlay that his genetic testing was fairly extensive.  If there are additional relevant genes identified to increase cancer risk that can be analyzed in the future, we would be happy to discuss and coordinate this testing at that time.   ? ? ?CANCER SCREENING RECOMMENDATIONS:  ?Mr. Bossler's test result is considered negative (normal).  This means that  we have not identified a hereditary cause for his family history of cancer at this time.  ? ?An individual's cancer risk and medical management are not determined by genetic test results alone. Overall cancer risk assessment incorporates additional factors, including personal medical history, family history, and any available genetic information that may result in a personalized plan for cancer prevention and surveillance. Therefore, it is recommended he continue to follow the cancer management and screening guidelines provided by his primary healthcare provider. ? ? ?RECOMMENDATIONS FOR FAMILY MEMBERS:   ?Individuals in this family might be at some increased risk of developing cancer, over the general population risk, due to the family history of cancer.  Individuals in the family should notify their providers of the family history of cancer. We recommend women in this family have a yearly mammogram beginning at age 25, or 26 years younger than the earliest onset of cancer, an annual clinical breast exam, and perform monthly breast self-exams.  Family members should have colonoscopies by at age 85, or earlier, as recommended by their providers. ?Other members of the family may still carry a pathogenic variant in one of these genes that Mr. Hasten did not inherit. If a hereditary cancer gene mutation is able to be confirmed in his paternal family, we recommend other paternal family members have genetic counseling and testing.  ? ?FOLLOW-UP:  ?Lastly, we discussed with Mr. Bledsoe that cancer genetics is a rapidly advancing field and it is possible that new genetic tests will be appropriate for him and/or his family members in the future. We encouraged him to remain in contact with cancer genetics on an annual basis so we can update his personal and family histories and let him know of advances in cancer genetics that may benefit this family.  ? ?Our contact number was provided. Mr. Mcbane questions were answered to  his satisfaction, and he knows he is welcome to call us at anytime with additional questions or concerns.  ? ?Gaelen Brager M. Joette Catching, [place], [place] ?Genetic Counselor ?[email or web address] ?[phone]) [phone] ? ?

## 2022-04-11 DIAGNOSIS — H60501 Unspecified acute noninfective otitis externa, right ear: Secondary | ICD-10-CM | POA: Diagnosis not present

## 2022-04-11 DIAGNOSIS — H9201 Otalgia, right ear: Secondary | ICD-10-CM | POA: Diagnosis not present

## 2022-04-12 DIAGNOSIS — C44612 Basal cell carcinoma of skin of right upper limb, including shoulder: Secondary | ICD-10-CM | POA: Diagnosis not present

## 2022-04-13 DIAGNOSIS — M6281 Muscle weakness (generalized): Secondary | ICD-10-CM | POA: Diagnosis not present

## 2022-04-13 DIAGNOSIS — M62838 Other muscle spasm: Secondary | ICD-10-CM | POA: Diagnosis not present

## 2022-04-25 DIAGNOSIS — R634 Abnormal weight loss: Secondary | ICD-10-CM | POA: Diagnosis not present

## 2022-04-28 DIAGNOSIS — R634 Abnormal weight loss: Secondary | ICD-10-CM | POA: Diagnosis not present

## 2022-05-01 DIAGNOSIS — J029 Acute pharyngitis, unspecified: Secondary | ICD-10-CM | POA: Diagnosis not present

## 2022-05-01 DIAGNOSIS — R509 Fever, unspecified: Secondary | ICD-10-CM | POA: Diagnosis not present

## 2022-05-01 DIAGNOSIS — J0100 Acute maxillary sinusitis, unspecified: Secondary | ICD-10-CM | POA: Diagnosis not present

## 2022-05-01 DIAGNOSIS — Z20822 Contact with and (suspected) exposure to covid-19: Secondary | ICD-10-CM | POA: Diagnosis not present

## 2022-05-18 DIAGNOSIS — J309 Allergic rhinitis, unspecified: Secondary | ICD-10-CM | POA: Diagnosis not present

## 2022-05-18 DIAGNOSIS — R490 Dysphonia: Secondary | ICD-10-CM | POA: Diagnosis not present

## 2022-05-23 DIAGNOSIS — M6281 Muscle weakness (generalized): Secondary | ICD-10-CM | POA: Diagnosis not present

## 2022-05-23 DIAGNOSIS — M62838 Other muscle spasm: Secondary | ICD-10-CM | POA: Diagnosis not present

## 2022-05-25 DIAGNOSIS — D485 Neoplasm of uncertain behavior of skin: Secondary | ICD-10-CM | POA: Diagnosis not present

## 2022-06-01 DIAGNOSIS — H9201 Otalgia, right ear: Secondary | ICD-10-CM | POA: Diagnosis not present

## 2022-06-05 DIAGNOSIS — M6289 Other specified disorders of muscle: Secondary | ICD-10-CM | POA: Diagnosis not present

## 2022-06-05 DIAGNOSIS — N3281 Overactive bladder: Secondary | ICD-10-CM | POA: Diagnosis not present

## 2022-06-05 DIAGNOSIS — M6281 Muscle weakness (generalized): Secondary | ICD-10-CM | POA: Diagnosis not present

## 2022-06-05 DIAGNOSIS — M62838 Other muscle spasm: Secondary | ICD-10-CM | POA: Diagnosis not present

## 2022-06-13 DIAGNOSIS — Z85828 Personal history of other malignant neoplasm of skin: Secondary | ICD-10-CM | POA: Diagnosis not present

## 2022-06-13 DIAGNOSIS — H9201 Otalgia, right ear: Secondary | ICD-10-CM | POA: Diagnosis not present

## 2022-06-15 DIAGNOSIS — N3281 Overactive bladder: Secondary | ICD-10-CM | POA: Diagnosis not present

## 2022-06-20 DIAGNOSIS — M62838 Other muscle spasm: Secondary | ICD-10-CM | POA: Diagnosis not present

## 2022-06-20 DIAGNOSIS — R3912 Poor urinary stream: Secondary | ICD-10-CM | POA: Diagnosis not present

## 2022-06-20 DIAGNOSIS — M6281 Muscle weakness (generalized): Secondary | ICD-10-CM | POA: Diagnosis not present

## 2022-06-21 DIAGNOSIS — H9201 Otalgia, right ear: Secondary | ICD-10-CM | POA: Diagnosis not present

## 2022-06-21 DIAGNOSIS — R519 Headache, unspecified: Secondary | ICD-10-CM | POA: Diagnosis not present

## 2022-06-23 DIAGNOSIS — E7800 Pure hypercholesterolemia, unspecified: Secondary | ICD-10-CM | POA: Diagnosis not present

## 2022-07-05 DIAGNOSIS — M6281 Muscle weakness (generalized): Secondary | ICD-10-CM | POA: Diagnosis not present

## 2022-07-05 DIAGNOSIS — N3281 Overactive bladder: Secondary | ICD-10-CM | POA: Diagnosis not present

## 2022-07-05 DIAGNOSIS — M62838 Other muscle spasm: Secondary | ICD-10-CM | POA: Diagnosis not present

## 2022-07-19 DIAGNOSIS — Z713 Dietary counseling and surveillance: Secondary | ICD-10-CM | POA: Diagnosis not present

## 2022-08-14 DIAGNOSIS — M6281 Muscle weakness (generalized): Secondary | ICD-10-CM | POA: Diagnosis not present

## 2022-08-14 DIAGNOSIS — R3913 Splitting of urinary stream: Secondary | ICD-10-CM | POA: Diagnosis not present

## 2022-08-14 DIAGNOSIS — M62838 Other muscle spasm: Secondary | ICD-10-CM | POA: Diagnosis not present

## 2022-08-23 DIAGNOSIS — H7290 Unspecified perforation of tympanic membrane, unspecified ear: Secondary | ICD-10-CM | POA: Diagnosis not present

## 2022-08-29 DIAGNOSIS — H9209 Otalgia, unspecified ear: Secondary | ICD-10-CM | POA: Diagnosis not present

## 2022-08-29 DIAGNOSIS — H6092 Unspecified otitis externa, left ear: Secondary | ICD-10-CM | POA: Diagnosis not present

## 2022-08-29 DIAGNOSIS — L299 Pruritus, unspecified: Secondary | ICD-10-CM | POA: Diagnosis not present

## 2022-08-30 DIAGNOSIS — Z713 Dietary counseling and surveillance: Secondary | ICD-10-CM | POA: Diagnosis not present

## 2022-09-05 DIAGNOSIS — H6092 Unspecified otitis externa, left ear: Secondary | ICD-10-CM | POA: Diagnosis not present

## 2022-09-05 DIAGNOSIS — J310 Chronic rhinitis: Secondary | ICD-10-CM | POA: Diagnosis not present

## 2022-09-07 DIAGNOSIS — M6281 Muscle weakness (generalized): Secondary | ICD-10-CM | POA: Diagnosis not present

## 2022-09-07 DIAGNOSIS — M62838 Other muscle spasm: Secondary | ICD-10-CM | POA: Diagnosis not present

## 2022-09-21 DIAGNOSIS — Z23 Encounter for immunization: Secondary | ICD-10-CM | POA: Diagnosis not present

## 2022-09-21 DIAGNOSIS — S50311A Abrasion of right elbow, initial encounter: Secondary | ICD-10-CM | POA: Diagnosis not present

## 2022-09-21 DIAGNOSIS — S81811A Laceration without foreign body, right lower leg, initial encounter: Secondary | ICD-10-CM | POA: Diagnosis not present

## 2022-09-21 DIAGNOSIS — W19XXXA Unspecified fall, initial encounter: Secondary | ICD-10-CM | POA: Diagnosis not present

## 2022-09-21 DIAGNOSIS — S81011A Laceration without foreign body, right knee, initial encounter: Secondary | ICD-10-CM | POA: Diagnosis not present

## 2022-09-21 DIAGNOSIS — S80211A Abrasion, right knee, initial encounter: Secondary | ICD-10-CM | POA: Diagnosis not present

## 2022-09-21 DIAGNOSIS — S81812A Laceration without foreign body, left lower leg, initial encounter: Secondary | ICD-10-CM | POA: Diagnosis not present

## 2022-09-27 ENCOUNTER — Ambulatory Visit (INDEPENDENT_AMBULATORY_CARE_PROVIDER_SITE_OTHER): Payer: Medicare Other | Admitting: Internal Medicine

## 2022-09-27 VITALS — BP 122/64 | HR 77 | Temp 97.5°F | Resp 18 | Ht 74.0 in | Wt 157.2 lb

## 2022-09-27 DIAGNOSIS — S81812D Laceration without foreign body, left lower leg, subsequent encounter: Secondary | ICD-10-CM | POA: Diagnosis not present

## 2022-09-27 DIAGNOSIS — M79641 Pain in right hand: Secondary | ICD-10-CM | POA: Diagnosis not present

## 2022-09-27 DIAGNOSIS — Z23 Encounter for immunization: Secondary | ICD-10-CM | POA: Diagnosis not present

## 2022-09-27 NOTE — Assessment & Plan Note (Signed)
He has good approximation of his laceration and there is no evidence of infection.  I want him to continue his keflex and he can see me next week to remove the sutures.

## 2022-09-27 NOTE — Progress Notes (Signed)
Office Visit  Subjective   Patient ID: Ryan [name]   DOB: 1961/05/18   Age: 61 y.o.   MRN: [id number]   Chief Complaint Chief Complaint  Patient presents with   Hospitalization Follow-up    RH  Fall and abrasion to rt elbow and knee     History of Present Illness Ryan [name] is a 61 yo male who comes in today for a wound check.  He accidentally fell into a woodpile while walking at night on 09/21/2022 where he ended up having laceration to his left lower leg and multiple abrasions involving his right forearm and elbow as well as his right knee.  He went to the [facility] ER where Dr. Maxwell Caul did sutures to his lower leg and placed bandages over the abrasions.  He was placed on keflex for 10 days and oxycodone as needed.  He is still on his antibiotics but there has been no f/c, no drainage or other problems.  They did xrays that showed no fracture of his lower leg.  They want him to remove his sutures in 10 days.  He also states he has been having increased pain with his hand.  He has seen [name] in the past where he was referred by Dr. Samule Dry with problems with his right elbow.  They did a MRI of his elbow but in the last week he has noticed a trigger of his 4th right finger where this finger will get stuck.  He is having pain and swelling.      Past Medical History Past Medical History:  Diagnosis Date   Anemia    hx   Anxiety    Arthritis    "joints of fingers; ankles" (04/29/2018)   BPH (benign prostatic hyperplasia)    Bulging lumbar disc dx'd 06/2016   Cellulitis of leg, right 04/28/2018   Chronic sinusitis    Daily headache    "daily lately; at least weekly" (04/29/2018)   Family history of breast cancer 01/16/2022   Family history of gastric cancer 01/16/2022   GERD (gastroesophageal reflux disease)    Hepatitis C ~ 2000   "turned yellow; jaundiced; not sure which kind of hepatitis" (04/29/2018)   IBS (irritable bowel syndrome)    Left lumbar radiculopathy    OCD (obsessive  compulsive disorder)    OCD (obsessive compulsive disorder)    Overactive bladder    Reiter's arthritis involving lower leg, unspecified laterality ([place])    Reiter's syndrome    Sacrococcygeal pain    Urethral stricture    Urge incontinence      Allergies Allergies  Allergen Reactions   Pollen Extract Other (See Comments)   Short Ragweed Pollen Ext Other (See Comments)    sneezing sneezing    Iodinated Contrast Media Rash    Flushing  Flushing       Review of Systems Review of Systems  Constitutional:  Negative for chills and fever.  Cardiovascular:  Negative for leg swelling.  Skin:  Negative for itching and rash.  Neurological:  Negative for dizziness, weakness and headaches.       Objective:    Vitals BP 122/64 (BP Location: Left Arm, Patient Position: Sitting, Cuff Size: Normal)   Pulse 77   Temp (!) 97.5 F (36.4 C) (Temporal)   Resp 18   Ht '6\' 2"'$  (1.88 m)   Wt 157 lb 3.2 oz (71.3 kg)   SpO2 98%   BMI 20.18 kg/m    Physical Examination Physical Exam  Constitutional:      Appearance: Normal appearance.  Cardiovascular:     Rate and Rhythm: Normal rate and regular rhythm.     Pulses: Normal pulses.     Heart sounds: Normal heart sounds. No murmur heard.    No friction rub.  Pulmonary:     Effort: Pulmonary effort is normal. No respiratory distress.     Breath sounds: Normal breath sounds.  Abdominal:     General: Abdomen is flat. Bowel sounds are normal.     Palpations: Abdomen is soft.  Skin:    General: Skin is warm and dry.     Findings: No rash.     Comments: He has a linear laceration with good approximation of his left lower extremity.  There is no redness or drainage.  Neurological:     Mental Status: He is alert.        Assessment & Plan:   Leg laceration, left, subsequent encounter He has good approximation of his laceration and there is no evidence of infection.  I want him to continue his keflex and he can see me next week to  remove the sutures.    Hand pain, right He is complaining of trigger finger of his 4th finger on his right hand and wants to refer back to [name].    No follow-ups on file.    Townsend Roger, MD

## 2022-09-27 NOTE — Assessment & Plan Note (Signed)
He is complaining of trigger finger of his 4th finger on his right hand and wants to refer back to [name].

## 2022-09-27 NOTE — Addendum Note (Signed)
Addended by: Townsend Roger on: 09/27/2022 03:00 PM   Modules accepted: Orders

## 2022-09-29 DIAGNOSIS — M62838 Other muscle spasm: Secondary | ICD-10-CM | POA: Diagnosis not present

## 2022-09-29 DIAGNOSIS — M6281 Muscle weakness (generalized): Secondary | ICD-10-CM | POA: Diagnosis not present

## 2022-09-29 DIAGNOSIS — N3281 Overactive bladder: Secondary | ICD-10-CM | POA: Diagnosis not present

## 2022-10-04 ENCOUNTER — Ambulatory Visit: Payer: Medicare Other | Admitting: Internal Medicine

## 2022-10-04 ENCOUNTER — Encounter: Payer: Self-pay | Admitting: Internal Medicine

## 2022-10-06 DIAGNOSIS — M65341 Trigger finger, right ring finger: Secondary | ICD-10-CM | POA: Diagnosis not present

## 2022-10-11 DIAGNOSIS — Z713 Dietary counseling and surveillance: Secondary | ICD-10-CM | POA: Diagnosis not present

## 2022-10-12 DIAGNOSIS — M65341 Trigger finger, right ring finger: Secondary | ICD-10-CM | POA: Diagnosis not present

## 2022-10-18 ENCOUNTER — Ambulatory Visit: Payer: Medicare Other | Admitting: Internal Medicine

## 2022-10-18 DIAGNOSIS — H6692 Otitis media, unspecified, left ear: Secondary | ICD-10-CM | POA: Diagnosis not present

## 2022-10-18 DIAGNOSIS — H60502 Unspecified acute noninfective otitis externa, left ear: Secondary | ICD-10-CM | POA: Diagnosis not present

## 2022-11-01 DIAGNOSIS — R3912 Poor urinary stream: Secondary | ICD-10-CM | POA: Diagnosis not present

## 2022-11-01 DIAGNOSIS — M6281 Muscle weakness (generalized): Secondary | ICD-10-CM | POA: Diagnosis not present

## 2022-11-01 DIAGNOSIS — M62838 Other muscle spasm: Secondary | ICD-10-CM | POA: Diagnosis not present

## 2022-11-08 DIAGNOSIS — Z713 Dietary counseling and surveillance: Secondary | ICD-10-CM | POA: Diagnosis not present

## 2022-11-14 ENCOUNTER — Other Ambulatory Visit: Payer: Self-pay

## 2022-11-14 MED ORDER — FLUVOXAMINE MALEATE 50 MG PO TABS: 50.0000 mg | ORAL_TABLET | Freq: Every day | ORAL | 1 refills | Status: DC

## 2022-11-17 DIAGNOSIS — M25531 Pain in right wrist: Secondary | ICD-10-CM | POA: Diagnosis not present

## 2022-11-17 DIAGNOSIS — M19131 Post-traumatic osteoarthritis, right wrist: Secondary | ICD-10-CM | POA: Diagnosis not present

## 2022-11-22 DIAGNOSIS — R3912 Poor urinary stream: Secondary | ICD-10-CM | POA: Diagnosis not present

## 2022-12-11 DIAGNOSIS — R051 Acute cough: Secondary | ICD-10-CM | POA: Diagnosis not present

## 2023-01-04 DIAGNOSIS — I83893 Varicose veins of bilateral lower extremities with other complications: Secondary | ICD-10-CM | POA: Diagnosis not present

## 2023-01-04 DIAGNOSIS — L299 Pruritus, unspecified: Secondary | ICD-10-CM | POA: Diagnosis not present

## 2023-01-04 DIAGNOSIS — M79605 Pain in left leg: Secondary | ICD-10-CM | POA: Diagnosis not present

## 2023-01-04 DIAGNOSIS — I83892 Varicose veins of left lower extremities with other complications: Secondary | ICD-10-CM | POA: Diagnosis not present

## 2023-01-04 DIAGNOSIS — M79662 Pain in left lower leg: Secondary | ICD-10-CM | POA: Diagnosis not present

## 2023-01-15 ENCOUNTER — Encounter: Payer: Self-pay | Admitting: Internal Medicine

## 2023-01-15 ENCOUNTER — Ambulatory Visit: Payer: 59 | Admitting: Internal Medicine

## 2023-01-15 VITALS — BP 122/70 | HR 84 | Temp 98.3°F | Resp 16 | Ht 74.0 in | Wt 158.2 lb

## 2023-01-15 DIAGNOSIS — H6992 Unspecified Eustachian tube disorder, left ear: Secondary | ICD-10-CM | POA: Diagnosis not present

## 2023-01-15 NOTE — Assessment & Plan Note (Signed)
He is not having long haulers.  I think he is having some left eusthacian tube dysfunction where he is going to ENT tomorrow.  He is on generic allegra and flonase.  I want him to use a nedi pot.

## 2023-01-15 NOTE — Progress Notes (Signed)
Office Visit  Subjective   Patient ID: Ryan [name]   DOB: [birth year]   Age: 62 y.o.   MRN: [id number]   Chief Complaint Chief Complaint  Patient presents with   Acute Visit    Discuss side effects after covid     History of Present Illness Ryan [name] is a 62 yo male who comes in today for followup after he was diagnosed with COVID on 12/11/2021.  He presented to [place] urgent care with symptoms of cough and congestion x 1 week.  They did a respiratory panel which was positive for COVID-19.  He was not treated for COVID-19 but was told to do supportive care.  He did improve after 5 days but he is having increase nasal discharge with runny nose that is clear.  He wants to make sure he does not have COVID long haulers.       Past Medical History Past Medical History:  Diagnosis Date   Anemia    hx   Anxiety    Arthritis    "joints of fingers; ankles" (04/29/2018)   BPH (benign prostatic hyperplasia)    Bulging lumbar disc dx'd 06/2016   Cellulitis of leg, right 04/28/2018   Chronic sinusitis    Daily headache    "daily lately; at least weekly" (04/29/2018)   Family history of breast cancer 01/16/2022   Family history of gastric cancer 01/16/2022   GERD (gastroesophageal reflux disease)    Hepatitis C ~ 2000   "turned yellow; jaundiced; not sure which kind of hepatitis" (04/29/2018)   IBS (irritable bowel syndrome)    Left lumbar radiculopathy    OCD (obsessive compulsive disorder)    OCD (obsessive compulsive disorder)    Overactive bladder    Reiter's arthritis involving lower leg, unspecified laterality (Ryan [name])    Reiter's syndrome    Sacrococcygeal pain    Urethral stricture    Urge incontinence      Allergies Allergies  Allergen Reactions   Pollen Extract Other (See Comments)   Short Ragweed Pollen Ext Other (See Comments)    sneezing sneezing    Iodinated Contrast Media Rash    Flushing  Flushing       Medications  Current Outpatient Medications:     ASHWAGANDHA PO, Take 1 tablet by mouth daily., Disp: , Rfl:    co-enzyme Q-10 30 MG capsule, Take 30 mg by mouth daily., Disp: , Rfl:    Emollient (COLLAGEN EX), Take 1 tablet by mouth daily., Disp: , Rfl:    fluticasone (FLONASE) 50 MCG/ACT nasal spray, Place into both nostrils daily., Disp: , Rfl:    fluvoxaMINE (LUVOX) 50 MG tablet, Take 1 tablet (50 mg total) by mouth at bedtime., Disp: 90 tablet, Rfl: 1   Misc Natural Products (MAGIC MUSHROOM MIX) CAPS, Take 1 tablet by mouth daily., Disp: , Rfl:    Misc Natural Products (NF FORMULAS TESTOSTERONE) CAPS, Take 1 tablet by mouth daily., Disp: , Rfl:    Misc Natural Products (PROSTATE) CAPS, Take 1 tablet by mouth daily., Disp: , Rfl:    Multiple Vitamin (MULTIVITAMIN WITH MINERALS) TABS tablet, Take 1 tablet by mouth daily., Disp: , Rfl:    Probiotic Product (CULTURELLE PROBIOTICS PO), Take 1 tablet by mouth daily., Disp: , Rfl:    Red Yeast Rice Extract (RED YEAST RICE PO), Take 1 tablet by mouth daily., Disp: , Rfl:    tamsulosin (FLOMAX) 0.4 MG CAPS capsule, Take 0.4 mg by mouth daily., Disp: , Rfl:  Turmeric (QC TUMERIC COMPLEX) 500 MG CAPS, Take 1 tablet by mouth daily., Disp: , Rfl:    valACYclovir (VALTREX) 1000 MG tablet, Take 4,000 mg by mouth once., Disp: , Rfl:    Review of Systems Review of Systems  Constitutional:  Negative for chills, fever and malaise/fatigue.  HENT:  Positive for congestion. Negative for sore throat.   Respiratory:  Negative for cough, sputum production, shortness of breath and wheezing.   Cardiovascular:  Negative for chest pain, palpitations and leg swelling.  Gastrointestinal:  Negative for constipation, diarrhea, nausea and vomiting.  Neurological:  Negative for dizziness, weakness and headaches.       Objective:    Vitals BP 122/70   Pulse 84   Temp 98.3 F (36.8 C)   Resp 16   Ht '6\' 2"'$  (1.88 m)   Wt 158 lb 3.2 oz (71.8 kg)   SpO2 96%   BMI 20.31 kg/m    Physical Examination Physical  Exam Constitutional:      Appearance: Normal appearance. He is not ill-appearing.  HENT:     Right Ear: Tympanic membrane, ear canal and external ear normal.     Left Ear: Tympanic membrane, ear canal and external ear normal.     Nose: Nose normal. No congestion or rhinorrhea.     Mouth/Throat:     Mouth: Mucous membranes are moist.     Pharynx: Oropharynx is clear. No oropharyngeal exudate or posterior oropharyngeal erythema.  Cardiovascular:     Rate and Rhythm: Normal rate and regular rhythm.     Pulses: Normal pulses.     Heart sounds: No murmur heard.    No friction rub. No gallop.  Pulmonary:     Effort: Pulmonary effort is normal. No respiratory distress.     Breath sounds: No wheezing, rhonchi or rales.  Abdominal:     General: Abdomen is flat. Bowel sounds are normal. There is no distension.     Palpations: Abdomen is soft.     Tenderness: There is no abdominal tenderness.  Musculoskeletal:     Right lower leg: No edema.     Left lower leg: No edema.  Skin:    General: Skin is warm and dry.     Findings: No rash.  Neurological:     Mental Status: He is alert.        Assessment & Plan:   Dysfunction of left eustachian tube He is not having long haulers.  I think he is having some left eusthacian tube dysfunction where he is going to ENT tomorrow.  He is on generic allegra and flonase.  I want him to use a nedi pot.    No follow-ups on file.   Townsend Roger, MD

## 2023-01-16 DIAGNOSIS — H6092 Unspecified otitis externa, left ear: Secondary | ICD-10-CM | POA: Diagnosis not present

## 2023-01-16 DIAGNOSIS — S81812A Laceration without foreign body, left lower leg, initial encounter: Secondary | ICD-10-CM | POA: Diagnosis not present

## 2023-01-16 DIAGNOSIS — H6122 Impacted cerumen, left ear: Secondary | ICD-10-CM | POA: Diagnosis not present

## 2023-01-25 DIAGNOSIS — I83892 Varicose veins of left lower extremities with other complications: Secondary | ICD-10-CM | POA: Diagnosis not present

## 2023-01-25 DIAGNOSIS — Z789 Other specified health status: Secondary | ICD-10-CM | POA: Diagnosis not present

## 2023-01-25 DIAGNOSIS — H9190 Unspecified hearing loss, unspecified ear: Secondary | ICD-10-CM | POA: Diagnosis not present

## 2023-01-25 DIAGNOSIS — M79662 Pain in left lower leg: Secondary | ICD-10-CM | POA: Diagnosis not present

## 2023-01-25 DIAGNOSIS — H6690 Otitis media, unspecified, unspecified ear: Secondary | ICD-10-CM | POA: Diagnosis not present

## 2023-02-07 DIAGNOSIS — I83892 Varicose veins of left lower extremities with other complications: Secondary | ICD-10-CM | POA: Diagnosis not present

## 2023-02-16 DIAGNOSIS — R6889 Other general symptoms and signs: Secondary | ICD-10-CM | POA: Diagnosis not present

## 2023-02-16 DIAGNOSIS — J00 Acute nasopharyngitis [common cold]: Secondary | ICD-10-CM | POA: Diagnosis not present

## 2023-02-16 DIAGNOSIS — Z20822 Contact with and (suspected) exposure to covid-19: Secondary | ICD-10-CM | POA: Diagnosis not present

## 2023-02-28 DIAGNOSIS — I83812 Varicose veins of left lower extremities with pain: Secondary | ICD-10-CM | POA: Diagnosis not present

## 2023-02-28 DIAGNOSIS — I83892 Varicose veins of left lower extremities with other complications: Secondary | ICD-10-CM | POA: Diagnosis not present

## 2023-02-28 DIAGNOSIS — M7989 Other specified soft tissue disorders: Secondary | ICD-10-CM | POA: Diagnosis not present

## 2023-03-28 ENCOUNTER — Other Ambulatory Visit: Payer: Self-pay | Admitting: Internal Medicine

## 2023-03-29 ENCOUNTER — Other Ambulatory Visit: Payer: Self-pay | Admitting: Internal Medicine

## 2023-04-04 ENCOUNTER — Encounter: Payer: Self-pay | Admitting: Internal Medicine

## 2023-04-04 ENCOUNTER — Ambulatory Visit: Payer: 59 | Admitting: Internal Medicine

## 2023-04-04 VITALS — BP 138/80 | HR 84 | Temp 98.2°F | Resp 16 | Ht 74.0 in | Wt 158.4 lb

## 2023-04-04 DIAGNOSIS — J0100 Acute maxillary sinusitis, unspecified: Secondary | ICD-10-CM | POA: Insufficient documentation

## 2023-04-04 MED ORDER — AMOXICILLIN-POT CLAVULANATE 875-125 MG PO TABS: 1.0000 | ORAL_TABLET | Freq: Two times a day (BID) | ORAL | 0 refills | Status: AC

## 2023-04-04 NOTE — Assessment & Plan Note (Signed)
He has allergies where he has developed a probable sinus infection.  We will start him on augmentin and flonase.  Continue supportive care.  His COVID-19 test was negative.

## 2023-04-04 NOTE — Progress Notes (Signed)
Office Visit  Subjective   Patient ID: Ryan [name]   DOB: June 22, 1961   Age: 62 y.o.   MRN: [id number]   Chief Complaint Chief Complaint  Patient presents with   Acute Visit    Congestion, headache, sore throat     History of Present Illness The patient is a 62 year old Caucasian/White male who presents with upper respiratory tract symptoms which began 2 days ago. He states he started with sinus congestion with yellow nasal discharge with post nasal drip as well as sore throat with cough with clear mucus production.  He has no additional complaints. He denies body aches, fever, shortness of breath, nausea, vomiting, diarrhea, chest congestion, and wheezing. Alleviating factors: tylenol, saline irrigation and herbal suypplment. The patient's past medical history is notable for a history of allergies.      Past Medical History Past Medical History:  Diagnosis Date   Anemia    hx   Anxiety    Arthritis    "joints of fingers; ankles" (04/29/2018)   BPH (benign prostatic hyperplasia)    Bulging lumbar disc dx'd 06/2016   Cellulitis of leg, right 04/28/2018   Chronic sinusitis    Daily headache    "daily lately; at least weekly" (04/29/2018)   Family history of breast cancer 01/16/2022   Family history of gastric cancer 01/16/2022   GERD (gastroesophageal reflux disease)    Hepatitis C ~ 2000   "turned yellow; jaundiced; not sure which kind of hepatitis" (04/29/2018)   IBS (irritable bowel syndrome)    Left lumbar radiculopathy    OCD (obsessive compulsive disorder)    OCD (obsessive compulsive disorder)    Overactive bladder    Reiter's arthritis involving lower leg, unspecified laterality ([facility])    Reiter's syndrome    Sacrococcygeal pain    Urethral stricture    Urge incontinence      Allergies Allergies  Allergen Reactions   Pollen Extract Other (See Comments)   Short Ragweed Pollen Ext Other (See Comments)    sneezing sneezing    Iodinated Contrast Media Rash     Flushing  Flushing       Medications  Current Outpatient Medications:    ASHWAGANDHA PO, Take 1 tablet by mouth daily., Disp: , Rfl:    co-enzyme Q-10 30 MG capsule, Take 30 mg by mouth daily., Disp: , Rfl:    Emollient (COLLAGEN EX), Take 1 tablet by mouth daily., Disp: , Rfl:    fluticasone (FLONASE) 50 MCG/ACT nasal spray, SHAKE LIQUID AND USE 1 SPRAY(50 MCG) IN EACH NOSTRIL TWICE DAILY, Disp: 48 g, Rfl: 3   fluvoxaMINE (LUVOX) 50 MG tablet, Take 1 tablet (50 mg total) by mouth at bedtime., Disp: 90 tablet, Rfl: 1   Misc Natural Products (MAGIC MUSHROOM MIX) CAPS, Take 1 tablet by mouth daily., Disp: , Rfl:    Misc Natural Products (NF FORMULAS TESTOSTERONE) CAPS, Take 1 tablet by mouth daily., Disp: , Rfl:    Misc Natural Products (PROSTATE) CAPS, Take 1 tablet by mouth daily., Disp: , Rfl:    Multiple Vitamin (MULTIVITAMIN WITH MINERALS) TABS tablet, Take 1 tablet by mouth daily., Disp: , Rfl:    Probiotic Product (CULTURELLE PROBIOTICS PO), Take 1 tablet by mouth daily., Disp: , Rfl:    Red Yeast Rice Extract (RED YEAST RICE PO), Take 1 tablet by mouth daily., Disp: , Rfl:    tamsulosin (FLOMAX) 0.4 MG CAPS capsule, Take 0.4 mg by mouth daily., Disp: , Rfl:  Turmeric (QC TUMERIC COMPLEX) 500 MG CAPS, Take 1 tablet by mouth daily., Disp: , Rfl:    valACYclovir (VALTREX) 1000 MG tablet, Take 4,000 mg by mouth once., Disp: , Rfl:    Review of Systems Review of Systems  Constitutional:  Negative for chills and fever.  HENT:  Positive for congestion and sore throat.   Respiratory:  Negative for cough, shortness of breath and wheezing.   Cardiovascular:  Negative for chest pain, palpitations and leg swelling.  Gastrointestinal:  Negative for abdominal pain, constipation, diarrhea, heartburn, nausea and vomiting.  Musculoskeletal:  Negative for myalgias.  Neurological:  Negative for dizziness, weakness and headaches.       Objective:    Vitals BP 138/80   Pulse 84   Temp 98.2  F (36.8 C)   Resp 16   Ht 6\' 2"  (1.88 m)   Wt 158 lb 6.4 oz (71.8 kg)   SpO2 97%   BMI 20.34 kg/m    Physical Examination Physical Exam Constitutional:      Appearance: Normal appearance. He is not ill-appearing.  HENT:     Right Ear: Tympanic membrane, ear canal and external ear normal.     Left Ear: Tympanic membrane, ear canal and external ear normal.     Nose: Congestion and rhinorrhea present.  Eyes:     General: No scleral icterus.    Conjunctiva/sclera: Conjunctivae normal.     Pupils: Pupils are equal, round, and reactive to light.  Cardiovascular:     Rate and Rhythm: Normal rate and regular rhythm.     Pulses: Normal pulses.     Heart sounds: No murmur heard.    No friction rub. No gallop.  Pulmonary:     Effort: Pulmonary effort is normal. No respiratory distress.     Breath sounds: No wheezing, rhonchi or rales.  Abdominal:     General: Abdomen is flat. Bowel sounds are normal. There is no distension.     Palpations: Abdomen is soft.     Tenderness: There is no abdominal tenderness.  Musculoskeletal:     Right lower leg: No edema.     Left lower leg: No edema.  Skin:    General: Skin is warm and dry.     Findings: No rash.  Neurological:     Mental Status: He is alert.        Assessment & Plan:   Acute non-recurrent maxillary sinusitis He has allergies where he has developed a probable sinus infection.  We will start him on augmentin and flonase.  Continue supportive care.  His COVID-19 test was negative.    No follow-ups on file.   Crist Fat, MD

## 2023-04-12 DIAGNOSIS — B379 Candidiasis, unspecified: Secondary | ICD-10-CM | POA: Diagnosis not present

## 2023-04-12 DIAGNOSIS — N481 Balanitis: Secondary | ICD-10-CM | POA: Diagnosis not present

## 2023-04-12 DIAGNOSIS — T3695XA Adverse effect of unspecified systemic antibiotic, initial encounter: Secondary | ICD-10-CM | POA: Diagnosis not present

## 2023-04-18 DIAGNOSIS — L299 Pruritus, unspecified: Secondary | ICD-10-CM | POA: Diagnosis not present

## 2023-04-18 DIAGNOSIS — I83813 Varicose veins of bilateral lower extremities with pain: Secondary | ICD-10-CM | POA: Diagnosis not present

## 2023-04-18 DIAGNOSIS — M7989 Other specified soft tissue disorders: Secondary | ICD-10-CM | POA: Diagnosis not present

## 2023-04-18 DIAGNOSIS — I872 Venous insufficiency (chronic) (peripheral): Secondary | ICD-10-CM | POA: Diagnosis not present

## 2023-05-23 ENCOUNTER — Encounter: Payer: Self-pay | Admitting: Internal Medicine

## 2023-05-23 ENCOUNTER — Ambulatory Visit: Payer: 59 | Admitting: Internal Medicine

## 2023-05-23 VITALS — BP 122/72 | HR 95 | Temp 98.0°F | Resp 18 | Ht 74.0 in | Wt 152.0 lb

## 2023-05-23 DIAGNOSIS — R21 Rash and other nonspecific skin eruption: Secondary | ICD-10-CM | POA: Insufficient documentation

## 2023-05-23 DIAGNOSIS — R200 Anesthesia of skin: Secondary | ICD-10-CM

## 2023-05-23 DIAGNOSIS — R202 Paresthesia of skin: Secondary | ICD-10-CM | POA: Diagnosis not present

## 2023-05-23 MED ORDER — CICLOPIROX 1 % EX SHAM: 10.0000 mL | MEDICATED_SHAMPOO | CUTANEOUS | 0 refills | Status: DC

## 2023-05-23 NOTE — Progress Notes (Signed)
Office Visit  Subjective   Patient ID: Ryan [name]   DOB: 02/14/1961   Age: 62 y.o.   MRN: [id number]   Chief Complaint Chief Complaint  Patient presents with   office visit    Nerve conduction test referral , need MRI for his back pain Psoriasis on head      History of Present Illness Ryan [name] comes in today with 2 complaints.    His first problem is he is having a rash/lesion of the occipital of her left scalp that first occurred about 2-3 months ago.  He wonders if this is psoriasis.  He states there is no erythema or pain but he does have pruritus.  He has been on ciclopirox 1% shampoo in the past that has helped this.  He also states he has had some numbness of his bilateral legs which has been going on for the last 2-3 months.  He states he had varicose vein ablation several months ago but he he is having numbness and tingling that effects both legs that goes down to his toes with his left leg worse than his right leg.  He is concerned he has neuropathy.  There has been no pain.       Past Medical History Past Medical History:  Diagnosis Date   Anemia    hx   Anxiety    Arthritis    "joints of fingers; ankles" (04/29/2018)   BPH (benign prostatic hyperplasia)    Bulging lumbar disc dx'd 06/2016   Cellulitis of leg, right 04/28/2018   Chronic sinusitis    Daily headache    "daily lately; at least weekly" (04/29/2018)   Family history of breast cancer 01/16/2022   Family history of gastric cancer 01/16/2022   GERD (gastroesophageal reflux disease)    Hepatitis C ~ 2000   "turned yellow; jaundiced; not sure which kind of hepatitis" (04/29/2018)   IBS (irritable bowel syndrome)    Left lumbar radiculopathy    OCD (obsessive compulsive disorder)    OCD (obsessive compulsive disorder)    Overactive bladder    Reiter's arthritis involving lower leg, unspecified laterality (HCC)    Reiter's syndrome    Sacrococcygeal pain    Urethral stricture    Urge incontinence       Allergies Allergies  Allergen Reactions   Pollen Extract Other (See Comments)   Short Ragweed Pollen Ext Other (See Comments)    sneezing sneezing    Iodinated Contrast Media Rash    Flushing  Flushing       Medications  Current Outpatient Medications:    acetaminophen (TYLENOL) 500 MG tablet, Take 1,000 mg by mouth every 4 (four) hours as needed., Disp: , Rfl:    cholecalciferol (VITAMIN D3) 25 MCG (1000 UNIT) tablet, Take 1 capsule by mouth daily., Disp: , Rfl:    ASHWAGANDHA PO, Take 1 tablet by mouth daily., Disp: , Rfl:    co-enzyme Q-10 30 MG capsule, Take 30 mg by mouth daily., Disp: , Rfl:    Emollient (COLLAGEN EX), Take 1 tablet by mouth daily., Disp: , Rfl:    fluticasone (FLONASE) 50 MCG/ACT nasal spray, SHAKE LIQUID AND USE 1 SPRAY(50 MCG) IN EACH NOSTRIL TWICE DAILY, Disp: 48 g, Rfl: 3   fluvoxaMINE (LUVOX) 50 MG tablet, Take 1 tablet (50 mg total) by mouth at bedtime., Disp: 90 tablet, Rfl: 1   Misc Natural Products (MAGIC MUSHROOM MIX) CAPS, Take 1 tablet by mouth daily., Disp: , Rfl:  Misc Natural Products (NF FORMULAS TESTOSTERONE) CAPS, Take 1 tablet by mouth daily., Disp: , Rfl:    Misc Natural Products (PROSTATE) CAPS, Take 1 tablet by mouth daily., Disp: , Rfl:    Multiple Vitamin (MULTIVITAMIN WITH MINERALS) TABS tablet, Take 1 tablet by mouth daily., Disp: , Rfl:    Probiotic Product (CULTURELLE PROBIOTICS PO), Take 1 tablet by mouth daily., Disp: , Rfl:    Red Yeast Rice Extract (RED YEAST RICE PO), Take 1 tablet by mouth daily., Disp: , Rfl:    Turmeric (QC TUMERIC COMPLEX) 500 MG CAPS, Take 1 tablet by mouth daily., Disp: , Rfl:    valACYclovir (VALTREX) 1000 MG tablet, Take 4,000 mg by mouth once., Disp: , Rfl:    Review of Systems Review of Systems  Constitutional:  Negative for fever.  Respiratory:  Negative for shortness of breath.   Cardiovascular:  Negative for chest pain and palpitations.  Gastrointestinal:  Negative for abdominal pain,  constipation, diarrhea, nausea and vomiting.  Skin:  Positive for itching and rash.  Neurological:  Negative for dizziness, weakness and headaches.       Objective:    Vitals BP 122/72 (BP Location: Left Arm, Patient Position: Sitting, Cuff Size: Normal)   Pulse 95   Temp 98 F (36.7 C)   Resp 18   Ht 6\' 2"  (1.88 m)   Wt 152 lb (68.9 kg)   SpO2 98%   BMI 19.52 kg/m    Physical Examination Physical Exam Constitutional:      Appearance: Normal appearance. He is not ill-appearing.  Cardiovascular:     Rate and Rhythm: Normal rate and regular rhythm.     Pulses: Normal pulses.     Heart sounds: No murmur heard.    No friction rub. No gallop.  Pulmonary:     Effort: Pulmonary effort is normal. No respiratory distress.     Breath sounds: No wheezing, rhonchi or rales.  Abdominal:     General: Abdomen is flat. Bowel sounds are normal. There is no distension.     Palpations: Abdomen is soft.     Tenderness: There is no abdominal tenderness.  Musculoskeletal:     Right lower leg: No edema.     Left lower leg: No edema.  Skin:    General: Skin is warm and dry.     Findings: No rash.  Neurological:     Mental Status: He is alert.     Comments: We did 5.07 monofilament testing to his bilteral feet and this was normal.  He had normal vibration sense.        Assessment & Plan:   Rash I am going to start him on ciclopirox shampoo and refer him to dermatology.  Numbness and tingling of both legs We will send him for an EMG.    Return in about 3 months (around 08/23/2023) for annual.   Crist Fat, MD

## 2023-05-23 NOTE — Assessment & Plan Note (Signed)
I am going to start him on ciclopirox shampoo and refer him to dermatology.

## 2023-05-23 NOTE — Assessment & Plan Note (Signed)
We will send him for an EMG.

## 2023-05-29 DIAGNOSIS — L219 Seborrheic dermatitis, unspecified: Secondary | ICD-10-CM | POA: Diagnosis not present

## 2023-05-29 DIAGNOSIS — L299 Pruritus, unspecified: Secondary | ICD-10-CM | POA: Diagnosis not present

## 2023-06-20 DIAGNOSIS — Z713 Dietary counseling and surveillance: Secondary | ICD-10-CM | POA: Diagnosis not present

## 2023-07-19 DIAGNOSIS — R6883 Chills (without fever): Secondary | ICD-10-CM | POA: Diagnosis not present

## 2023-07-19 DIAGNOSIS — B9689 Other specified bacterial agents as the cause of diseases classified elsewhere: Secondary | ICD-10-CM | POA: Diagnosis not present

## 2023-07-19 DIAGNOSIS — J028 Acute pharyngitis due to other specified organisms: Secondary | ICD-10-CM | POA: Diagnosis not present

## 2023-07-19 DIAGNOSIS — R070 Pain in throat: Secondary | ICD-10-CM | POA: Diagnosis not present

## 2023-07-24 DIAGNOSIS — L219 Seborrheic dermatitis, unspecified: Secondary | ICD-10-CM | POA: Diagnosis not present

## 2023-07-24 DIAGNOSIS — C44319 Basal cell carcinoma of skin of other parts of face: Secondary | ICD-10-CM | POA: Diagnosis not present

## 2023-08-17 ENCOUNTER — Encounter: Payer: Self-pay | Admitting: Internal Medicine

## 2023-08-17 ENCOUNTER — Ambulatory Visit: Payer: 59 | Admitting: Internal Medicine

## 2023-08-17 VITALS — BP 130/70 | HR 61 | Temp 98.2°F | Resp 16 | Ht 74.0 in | Wt 157.6 lb

## 2023-08-17 DIAGNOSIS — R490 Dysphonia: Secondary | ICD-10-CM | POA: Diagnosis not present

## 2023-08-17 MED ORDER — MONTELUKAST SODIUM 10 MG PO TABS: 10.0000 mg | ORAL_TABLET | Freq: Every day | ORAL | 2 refills | Status: DC

## 2023-08-17 NOTE — Progress Notes (Signed)
Office Visit  Subjective   Patient ID: Ryan [name]   DOB: [birth year]   Age: 62 y.o.   MRN: [id number]   Chief Complaint Chief Complaint  Patient presents with   Acute Visit    Raspy voice     History of Present Illness Mr. Eppert is a 62 yo male who comes in today for an acute visit where he has complaints of "raspy voice".  This started last month He was around people who were smoking and he was drinking alcohol which can effect his vocal cords in the past.  He states that the past week his voice went back to normal but today he started having a raspy voice again.  He was seen by urgent care a month ago where they did testing including COVID-19, rapid strep and a strep culture.  They gave him amoxicillin 500mg  BID which he states did not help.  The patient has been having some seasonal allergies which started in [facility] Sept with sinus congestion, sneezing, runny nose, without post nasal drip.  There is no sore throat at this time.  He currently takes xyzal and flonase which help his symptoms.  There is no chest congestion, cough, wheezing or SOB.  The patient states he has been having some reflux symptoms and he noticed this with drinking coffee.       Past Medical History Past Medical History:  Diagnosis Date   Anemia    hx   Anxiety    Arthritis    "joints of fingers; ankles" (04/29/2018)   BPH (benign prostatic hyperplasia)    Bulging lumbar disc dx'd 06/2016   Cellulitis of leg, right 04/28/2018   Chronic sinusitis    Daily headache    "daily lately; at least weekly" (04/29/2018)   Family history of breast cancer 01/16/2022   Family history of gastric cancer 01/16/2022   GERD (gastroesophageal reflux disease)    Hepatitis C ~ 2000   "turned yellow; jaundiced; not sure which kind of hepatitis" (04/29/2018)   IBS (irritable bowel syndrome)    Left lumbar radiculopathy    OCD (obsessive compulsive disorder)    OCD (obsessive compulsive disorder)    Overactive bladder    Reiter's  arthritis involving lower leg, unspecified laterality (HCC)    Reiter's syndrome    Sacrococcygeal pain    Urethral stricture    Urge incontinence      Allergies Allergies  Allergen Reactions   Pollen Extract Other (See Comments)   Short Ragweed Pollen Ext Other (See Comments)    sneezing sneezing    Iodinated Contrast Media Rash    Flushing  Flushing       Medications  Current Outpatient Medications:    acetaminophen (TYLENOL) 500 MG tablet, Take 1,000 mg by mouth every 4 (four) hours as needed., Disp: , Rfl:    ASHWAGANDHA PO, Take 1 tablet by mouth daily., Disp: , Rfl:    cholecalciferol (VITAMIN D3) 25 MCG (1000 UNIT) tablet, Take 1 capsule by mouth daily., Disp: , Rfl:    Ciclopirox 1 % shampoo, Apply 10 mLs topically every 3 (three) days., Disp: 120 mL, Rfl: 0   co-enzyme Q-10 30 MG capsule, Take 30 mg by mouth daily., Disp: , Rfl:    Emollient (COLLAGEN EX), Take 1 tablet by mouth daily., Disp: , Rfl:    fluticasone (FLONASE) 50 MCG/ACT nasal spray, SHAKE LIQUID AND USE 1 SPRAY(50 MCG) IN EACH NOSTRIL TWICE DAILY, Disp: 48 g, Rfl: 3   fluvoxaMINE (  LUVOX) 50 MG tablet, Take 1 tablet (50 mg total) by mouth at bedtime., Disp: 90 tablet, Rfl: 1   Misc Natural Products (MAGIC MUSHROOM MIX) CAPS, Take 1 tablet by mouth daily., Disp: , Rfl:    Misc Natural Products (NF FORMULAS TESTOSTERONE) CAPS, Take 1 tablet by mouth daily., Disp: , Rfl:    Misc Natural Products (PROSTATE) CAPS, Take 1 tablet by mouth daily., Disp: , Rfl:    Multiple Vitamin (MULTIVITAMIN WITH MINERALS) TABS tablet, Take 1 tablet by mouth daily., Disp: , Rfl:    Probiotic Product (CULTURELLE PROBIOTICS PO), Take 1 tablet by mouth daily., Disp: , Rfl:    Red Yeast Rice Extract (RED YEAST RICE PO), Take 1 tablet by mouth daily., Disp: , Rfl:    Turmeric (QC TUMERIC COMPLEX) 500 MG CAPS, Take 1 tablet by mouth daily., Disp: , Rfl:    valACYclovir (VALTREX) 1000 MG tablet, Take 4,000 mg by mouth once., Disp: ,  Rfl:    Review of Systems Review of Systems  Constitutional:  Negative for chills and fever.  HENT:  Positive for congestion and sinus pain. Negative for sore throat.   Respiratory:  Negative for cough, sputum production, shortness of breath and wheezing.   Cardiovascular:  Negative for chest pain and palpitations.  Gastrointestinal:  Positive for heartburn. Negative for abdominal pain, constipation, diarrhea, nausea and vomiting.  Neurological:  Positive for headaches. Negative for weakness.       Objective:    Vitals BP 130/70   Pulse 61   Temp 98.2 F (36.8 C)   Resp 16   Ht 6\' 2"  (1.88 m)   Wt 157 lb 9.6 oz (71.5 kg)   SpO2 98%   BMI 20.23 kg/m    Physical Examination Physical Exam Constitutional:      Appearance: Normal appearance. He is not ill-appearing.  HENT:     Right Ear: Tympanic membrane, ear canal and external ear normal.     Left Ear: Tympanic membrane, ear canal and external ear normal.     Nose: Congestion and rhinorrhea present.     Mouth/Throat:     Mouth: Mucous membranes are moist.     Pharynx: Oropharynx is clear. No oropharyngeal exudate or posterior oropharyngeal erythema.  Cardiovascular:     Rate and Rhythm: Normal rate and regular rhythm.     Pulses: Normal pulses.     Heart sounds: No murmur heard.    No friction rub. No gallop.  Pulmonary:     Effort: Pulmonary effort is normal. No respiratory distress.     Breath sounds: No wheezing, rhonchi or rales.  Abdominal:     General: Abdomen is flat. Bowel sounds are normal. There is no distension.     Palpations: Abdomen is soft.     Tenderness: There is no abdominal tenderness.  Musculoskeletal:     Right lower leg: No edema.     Left lower leg: No edema.  Skin:    General: Skin is warm and dry.     Findings: No rash.  Neurological:     Mental Status: He is alert.        Assessment & Plan:   Hoarseness of voice It sounds like he is having some allergies that is causing his  hoarseness.  He is using nasal saline irrigation as well as xyzal and flonase.  I am going to start him on singulair and refer him to Allergy.  The other problems he could be having is nasopharyngeal reflux  as he states he has symptoms at times.  I am going to do a trial of singulair along with his other allergy meds for 3-4 weeks.  If he does not improve, then we will start him on omeprazole or protonix.    No follow-ups on file.   Crist Fat, MD

## 2023-08-17 NOTE — Assessment & Plan Note (Signed)
It sounds like he is having some allergies that is causing his hoarseness.  He is using nasal saline irrigation as well as xyzal and flonase.  I am going to start him on singulair and refer him to Allergy.  The other problems he could be having is nasopharyngeal reflux as he states he has symptoms at times.  I am going to do a trial of singulair along with his other allergy meds for 3-4 weeks.  If he does not improve, then we will start him on omeprazole or protonix.

## 2023-08-24 DIAGNOSIS — E7800 Pure hypercholesterolemia, unspecified: Secondary | ICD-10-CM | POA: Diagnosis not present

## 2023-08-24 DIAGNOSIS — Z713 Dietary counseling and surveillance: Secondary | ICD-10-CM | POA: Diagnosis not present

## 2023-08-28 DIAGNOSIS — C44329 Squamous cell carcinoma of skin of other parts of face: Secondary | ICD-10-CM | POA: Diagnosis not present

## 2023-09-07 ENCOUNTER — Encounter: Payer: Self-pay | Admitting: Internal Medicine

## 2023-09-07 ENCOUNTER — Encounter: Payer: 59 | Admitting: Internal Medicine

## 2023-09-11 ENCOUNTER — Encounter: Payer: 59 | Admitting: Internal Medicine

## 2023-09-14 ENCOUNTER — Ambulatory Visit: Payer: 59 | Admitting: Internal Medicine

## 2023-09-14 ENCOUNTER — Encounter: Payer: Self-pay | Admitting: Internal Medicine

## 2023-09-14 VITALS — BP 112/76 | HR 68 | Temp 98.3°F | Resp 16 | Ht 74.0 in | Wt 153.6 lb

## 2023-09-14 DIAGNOSIS — Z681 Body mass index (BMI) 19 or less, adult: Secondary | ICD-10-CM | POA: Diagnosis not present

## 2023-09-14 DIAGNOSIS — R799 Abnormal finding of blood chemistry, unspecified: Secondary | ICD-10-CM | POA: Diagnosis not present

## 2023-09-14 DIAGNOSIS — J309 Allergic rhinitis, unspecified: Secondary | ICD-10-CM

## 2023-09-14 DIAGNOSIS — M5136 Other intervertebral disc degeneration, lumbar region with discogenic back pain only: Secondary | ICD-10-CM | POA: Insufficient documentation

## 2023-09-14 DIAGNOSIS — E7800 Pure hypercholesterolemia, unspecified: Secondary | ICD-10-CM | POA: Insufficient documentation

## 2023-09-14 DIAGNOSIS — F429 Obsessive-compulsive disorder, unspecified: Secondary | ICD-10-CM

## 2023-09-14 DIAGNOSIS — F411 Generalized anxiety disorder: Secondary | ICD-10-CM

## 2023-09-14 DIAGNOSIS — Z0000 Encounter for general adult medical examination without abnormal findings: Secondary | ICD-10-CM | POA: Diagnosis not present

## 2023-09-14 DIAGNOSIS — N3281 Overactive bladder: Secondary | ICD-10-CM | POA: Diagnosis not present

## 2023-09-14 DIAGNOSIS — M79604 Pain in right leg: Secondary | ICD-10-CM | POA: Insufficient documentation

## 2023-09-14 DIAGNOSIS — M79605 Pain in left leg: Secondary | ICD-10-CM

## 2023-09-14 DIAGNOSIS — F330 Major depressive disorder, recurrent, mild: Secondary | ICD-10-CM

## 2023-09-14 DIAGNOSIS — K219 Gastro-esophageal reflux disease without esophagitis: Secondary | ICD-10-CM | POA: Diagnosis not present

## 2023-09-14 NOTE — Progress Notes (Signed)
Office Visit  Subjective   Patient ID: Ryan [name]   DOB: 1961-06-03   Age: 62 y.o.   MRN: [id number]   Chief Complaint Chief Complaint  Patient presents with   Follow-up     History of Present Illness Ryan [name] is a 62 year old Caucasian/White male who presents for his annual health maintenance exam. He is due for the following health maintenance studies: screening labs. This patient's past medical history Alopecia, Anxiety Disorder, Benign Prostatic Hypertrophy, Depression, GERD, and Hypercholesterolemia.   His last eye exam was in 08/2023 and he states his vision is doing well. His last colonoscopy was in 10/11/2020 and this showed a tubular adenoma polyp. He was having hematochezia but they felt this was due to hemorrhoids. They want to repeat a colonoscopy in 10 years. His previous colonoscopy was in 08/2015 and this was normal. He was being followed by Urology for an overactive bladder where he was on Flomax which he stopped. He states it made him sleepy. He was undergoing therapy for pelvic wall exercises for his overactive bladder.  He has now been discharged. The patient does exercise regularly by walking. The patient does get yearly flu vaccines. He has had 3 COVID-19 vaccines. There is no family history of heart disease. He is not on an ASA.   I did see Ryan [name] earlier last month on 08/17/2023 for an acute visit where he has complaints of "raspy voice".  He was seen by urgent care a month ago where they did testing including COVID-19, rapid strep and a strep culture.  They gave him amoxicillin 500mg  BID which he states did not help.  The patient has been having some seasonal allergies which started in [facility] Sept with sinus congestion, sneezing, runny nose, without post nasal drip.  There is no sore throat at this time.  He currently takes xyzal and flonase which help his symptoms.  There is no chest congestion, cough, wheezing or SOB.  The patient states he has been having some  reflux symptoms and he noticed this with drinking coffee.  I felt had some allergies and asked him to do nasal saline irrigations an started on singulair.  He has been referred to allergist which has been set up.  His hoarseness of his voice has improved over the last month.  I also saw Ryan [name] on 05/2023 for numbness of his bilateral legs which has been going on for the last 2-3 months.  He states he had varicose vein ablation several months ago but he he is having numbness and tingling that effects both legs that goes down to his toes with his left leg worse than his right leg.  He was concerned he has neuropathy.  There has been no pain.  I set him up for an EMG and he was setup to see Dr. Antonietta Barcelona.  He had a family emergency where he had to reschedule and he now has an appointment in 12/2023.  The patient returns for followup of his depression and anxiety as well as OCD.   Today, he states his depression is mild and his anxiety is moderate.  I started him on viibryd 20mg  daily and referred him to the nutritionist as he was losinig weight.  He tells me the nutritionist counselled him and he was not taking enough caloric intake.  Our plan was to see him back after starting viibryd and if his anxiety was controlled and he continued to have weight loss, we would  possibly refer to GI.  However, he gained weight and this was not a problem.  He was on viibryd but stopped this and he is currently just on supplments.  He denies any panic attacks.  He has a history of anxiety/OCD/Depression.  However, he states he is now having more OCD patterns and is requesting to go back on fluvoxamine.  He was placed on this for his GAD/OCD in early summer of 2022.  He was seeing a psychiatrist in [place] where they started him on Remeron.  He no longer goes to this psychiatrist and is looking going back to [place].   He is currently on disability due to his OCD where he has been on disability since 2008. He was on fluvoxamine  (Luvox) for about 15 years. He was followed by [facility] where he is seeing a Haematologist and psychiatrist in [facility] at this time. He denies helpless feeling and suicidal ideation. This patient feels that she is able to care for himself. He currently lives alone. He has a history of being admitted to a psychiatric hospital in 1990's. There was no suicidal ideation at that time.   Ryan [name] returns today for routine followup on his cholesterol.  On his yearly exam in 01/2022, his cholesterol was elevated and we discovered his ASCVD score was 10.8% at that time.  I aske him to start pravastatin and he states that he took it for 5 days and then stopped it due to it causing myalgias in his right arm.   He states he is on a herbal supplement taking both red yeast rice and Fenugreek.  He is also drinking beet juice.  We checked his FLP in 2018 and his ASCVD score was 3.3% at that time. Overall, he states he is doing well and is without any complaints or problems at this time. He specifically denies abdominal pain, nausea, vomiting, diarrhea, myalgias, and fatigue. He remains on dietary management and supplements as described as well as red yeast rice. He is fasting today.  In 2022, we noted that he had some mild leukopenia.  I did refer him to Hematology  where they repeated his WBC and it was normal in 12/2021.  They were not sure what the cause of his mild intermittent leukopenia was due to but the differential included infection, medications and possible a normal variant.  He tells me that they have done genetic testing and he is waiting on these results.    The patient has a history of intermittent bouts of rhinitis and sinusitis felt to be due to weather or environmental exposures. The patient's past medical history is noncontributory. He has used Flonase and nasal saline washes per ENT's recommendations.  He has seen ENT and had rigid endoscopy on 05/13/2021 where he saw no evidence of recurrent sinus  infection.  The patient also has a history of allergic rhinitis.  He has seen Dr. Marcheta Grammes in ENT where they did nasal endoscopy. He has also seen Dr. Delorse Lek in Allergy in 02/2018 and they did allergy testing where he is allergy to dogs, cats and mold.  They continued him on xyzal and flonase and started him on astelin nasal spray but he does not take this regularly.  As described above regarding his overactive bladder, the patient has been seen by urology for his BOO and urinary urgency and frequency.  They performed a cystoscopy on him that showed no obstructing pathology with a widely patient prostatic urethra.  They believe the majority of  his urinary symptoms are centered around pelvic floor muscle tension and dyssenrgia when voiding which is compounded by ongoing OCD and anxiety.  They want him to continue with Flomax and pelvic floor physical therapy.  The patient has been on Myrbetriq in the past.  He did have a botox injection in 08/2019 for his bladder.  Again, he did stop his Flomax.  He wants a new referral to urology.     Ryan [name] returns for followup of his chronic back pain syndrome.  Today, he denies any problems with back pain.  He has neurology in the past and they have sent him for physical therapy in the past.   He has a history of DDD and mild lumbar spinal stenosis where he has seen both neurosurgery and neurology.  He was seen in 2018 by Dr. Lovell Sheehan in neurosurgery who did a MRI of his spine on 05/01/2017 which showed multifocal degenerative disc disease from L2-L3 down to L5-S1.  There was L3-L4 left sided disc bulging with mass effect at left L4 nerve root with mild spinal stenosis of L4 and bilateral lateral recess stenosis at L3-L4.  They diagnosed him with chronic back pain syndrome at that time but noted he had no radicular symptoms and did not recommend surgery.  He was then seen by Dr. Tripplett/Dr. Andrey Campanile in neurology where he tells me they did additional biochemical testing for his  "back pain" which was negative.  He was told that they felt that he had the above diagnoses that neurosurgery gave him with DDD and recommended ESI, physical therapy and/or acupuncture.  The patient denies any weakness and no loss of bowel/bladder problems.  He states he refuses ESI after reading about it and never underwent acupuncture.    The patient also has a history of reflux.  He denies any symptoms of reflux at this time and he is currently using a probiotic.    He also uses valtrex prn for cold sores.  He states he will have cold sores maybe twice a year.  He also has a history of varicose veins where he saw the Vein clinic in [place].  They are recommending him to wear compression hose and he has had varicose injections which resolved a lot of his lower extremity "spider veins".                 Past Medical History Past Medical History:  Diagnosis Date   Anemia    hx   Anxiety    Arthritis    "joints of fingers; ankles" (04/29/2018)   BPH (benign prostatic hyperplasia)    Bulging lumbar disc dx'd 06/2016   Cellulitis of leg, right 04/28/2018   Chronic sinusitis    Daily headache    "daily lately; at least weekly" (04/29/2018)   Family history of breast cancer 01/16/2022   Family history of gastric cancer 01/16/2022   GERD (gastroesophageal reflux disease)    Hepatitis C ~ 2000   "turned yellow; jaundiced; not sure which kind of hepatitis" (04/29/2018)   IBS (irritable bowel syndrome)    Left lumbar radiculopathy    OCD (obsessive compulsive disorder)    OCD (obsessive compulsive disorder)    Overactive bladder    Reiter's arthritis involving lower leg, unspecified laterality (HCC)    Reiter's syndrome    Sacrococcygeal pain    Urethral stricture    Urge incontinence      Allergies Allergies  Allergen Reactions   Pollen Extract Other (See Comments)  Short Ragweed Pollen Ext Other (See Comments)    sneezing sneezing    Iodinated Contrast Media Rash     Flushing  Flushing       Medications  Current Outpatient Medications:    acetaminophen (TYLENOL) 500 MG tablet, Take 1,000 mg by mouth every 4 (four) hours as needed., Disp: , Rfl:    ASHWAGANDHA PO, Take 1 tablet by mouth daily., Disp: , Rfl:    cholecalciferol (VITAMIN D3) 25 MCG (1000 UNIT) tablet, Take 1 capsule by mouth daily., Disp: , Rfl:    Ciclopirox 1 % shampoo, Apply 10 mLs topically every 3 (three) days., Disp: 120 mL, Rfl: 0   co-enzyme Q-10 30 MG capsule, Take 30 mg by mouth daily., Disp: , Rfl:    Emollient (COLLAGEN EX), Take 1 tablet by mouth daily., Disp: , Rfl:    fluticasone (FLONASE) 50 MCG/ACT nasal spray, SHAKE LIQUID AND USE 1 SPRAY(50 MCG) IN EACH NOSTRIL TWICE DAILY, Disp: 48 g, Rfl: 3   Misc Natural Products (MAGIC MUSHROOM MIX) CAPS, Take 1 tablet by mouth daily., Disp: , Rfl:    Misc Natural Products (NF FORMULAS TESTOSTERONE) CAPS, Take 1 tablet by mouth daily., Disp: , Rfl:    Misc Natural Products (PROSTATE) CAPS, Take 1 tablet by mouth daily., Disp: , Rfl:    montelukast (SINGULAIR) 10 MG tablet, Take 1 tablet (10 mg total) by mouth daily., Disp: 30 tablet, Rfl: 2   Multiple Vitamin (MULTIVITAMIN WITH MINERALS) TABS tablet, Take 1 tablet by mouth daily., Disp: , Rfl:    Probiotic Product (CULTURELLE PROBIOTICS PO), Take 1 tablet by mouth daily., Disp: , Rfl:    Red Yeast Rice Extract (RED YEAST RICE PO), Take 1 tablet by mouth daily., Disp: , Rfl:    Turmeric (QC TUMERIC COMPLEX) 500 MG CAPS, Take 1 tablet by mouth daily., Disp: , Rfl:    valACYclovir (VALTREX) 1000 MG tablet, Take 4,000 mg by mouth once., Disp: , Rfl:    Review of Systems Review of Systems  Constitutional:  Negative for chills, fever and malaise/fatigue.  Eyes:  Negative for blurred vision and double vision.  Respiratory:  Negative for cough, hemoptysis, shortness of breath and wheezing.   Cardiovascular:  Negative for chest pain, palpitations and leg swelling.  Gastrointestinal:   Negative for abdominal pain, blood in stool, constipation, diarrhea, heartburn, melena, nausea and vomiting.  Genitourinary:  Positive for frequency. Negative for hematuria.  Musculoskeletal:  Negative for back pain and myalgias.  Skin:  Positive for itching. Negative for rash.  Neurological:  Negative for dizziness, weakness and headaches.  Endo/Heme/Allergies:  Negative for polydipsia.  Psychiatric/Behavioral:  Negative for substance abuse.        Objective:    Vitals BP 112/76   Pulse 68   Temp 98.3 F (36.8 C)   Resp 16   Ht 6\' 2"  (1.88 m)   Wt 153 lb 9.6 oz (69.7 kg)   SpO2 95%   BMI 19.72 kg/m    Physical Examination Physical Exam Constitutional:      Appearance: Normal appearance. He is not ill-appearing.  HENT:     Head: Normocephalic and atraumatic.     Right Ear: Tympanic membrane, ear canal and external ear normal.     Left Ear: Tympanic membrane, ear canal and external ear normal.     Nose: Nose normal. No congestion or rhinorrhea.     Mouth/Throat:     Mouth: Mucous membranes are moist.     Pharynx: Oropharynx is  clear. No oropharyngeal exudate or posterior oropharyngeal erythema.  Eyes:     General: No scleral icterus.    Conjunctiva/sclera: Conjunctivae normal.     Pupils: Pupils are equal, round, and reactive to light.  Neck:     Vascular: No carotid bruit.  Cardiovascular:     Rate and Rhythm: Normal rate and regular rhythm.     Pulses: Normal pulses.     Heart sounds: No murmur heard.    No friction rub. No gallop.  Pulmonary:     Effort: Pulmonary effort is normal. No respiratory distress.     Breath sounds: No wheezing, rhonchi or rales.  Abdominal:     General: Abdomen is flat. Bowel sounds are normal. There is no distension.     Palpations: Abdomen is soft.     Tenderness: There is no abdominal tenderness.  Musculoskeletal:     Cervical back: Neck supple. No tenderness.     Right lower leg: No edema.     Left lower leg: No edema.   Lymphadenopathy:     Cervical: No cervical adenopathy.  Skin:    General: Skin is warm and dry.     Findings: No rash.  Neurological:     General: No focal deficit present.     Mental Status: He is alert and oriented to person, place, and time.  Psychiatric:        Mood and Affect: Mood normal.        Behavior: Behavior normal.        Assessment & Plan:   No problem-specific Assessment & Plan notes found for this encounter.    Return in about 4 months (around 01/12/2024).   Crist Fat, MD

## 2023-09-15 LAB — LIPID PANEL
Chol/HDL Ratio: 2.4 ratio (ref 0.0–5.0)
Cholesterol, Total: 234 mg/dL — ABNORMAL HIGH (ref 100–199)
HDL: 99 mg/dL (ref >39)
LDL Chol Calc (NIH): 126 mg/dL — ABNORMAL HIGH (ref 0–99)
Triglycerides: 52 mg/dL (ref 0–149)
VLDL Cholesterol Cal: 9 mg/dL (ref 5–40)

## 2023-09-15 LAB — CMP14 + ANION GAP
ALT: 20 [IU]/L (ref 0–44)
AST: 27 [IU]/L (ref 0–40)
Albumin: 4.5 g/dL (ref 3.9–4.9)
Alkaline Phosphatase: 58 [IU]/L (ref 44–121)
Anion Gap: 9 mmol/L — ABNORMAL LOW (ref 10.0–18.0)
BUN/Creatinine Ratio: 20 (ref 10–24)
BUN: 18 mg/dL (ref 8–27)
Bilirubin Total: 0.7 mg/dL (ref 0.0–1.2)
CO2: 28 mmol/L (ref 20–29)
Calcium: 9.7 mg/dL (ref 8.6–10.2)
Chloride: 103 mmol/L (ref 96–106)
Creatinine, Ser: 0.91 mg/dL (ref 0.76–1.27)
Globulin, Total: 2.3 g/dL (ref 1.5–4.5)
Glucose: 76 mg/dL (ref 70–99)
Potassium: 4.9 mmol/L (ref 3.5–5.2)
Sodium: 140 mmol/L (ref 134–144)
Total Protein: 6.8 g/dL (ref 6.0–8.5)
eGFR: 95 mL/min/{1.73_m2} (ref >59)

## 2023-09-15 LAB — CBC WITH DIFFERENTIAL/PLATELET
Basophils Absolute: 0 10*3/uL (ref 0.0–0.2)
Basos: 1 %
EOS (ABSOLUTE): 0.1 10*3/uL (ref 0.0–0.4)
Eos: 2 %
Hematocrit: 44.7 % (ref 37.5–51.0)
Hemoglobin: 15.4 g/dL (ref 13.0–17.7)
Immature Grans (Abs): 0 10*3/uL (ref 0.0–0.1)
Immature Granulocytes: 0 %
Lymphocytes Absolute: 1.4 10*3/uL (ref 0.7–3.1)
Lymphs: 34 %
MCH: 33.3 pg — ABNORMAL HIGH (ref 26.6–33.0)
MCHC: 34.5 g/dL (ref 31.5–35.7)
MCV: 97 fL (ref 79–97)
Monocytes Absolute: 0.4 10*3/uL (ref 0.1–0.9)
Monocytes: 10 %
Neutrophils Absolute: 2.2 10*3/uL (ref 1.4–7.0)
Neutrophils: 53 %
Platelets: 245 10*3/uL (ref 150–450)
RBC: 4.63 x10E6/uL (ref 4.14–5.80)
RDW: 11.5 % — ABNORMAL LOW (ref 11.6–15.4)
WBC: 4.1 10*3/uL (ref 3.4–10.8)

## 2023-09-15 LAB — TSH: TSH: 1.52 u[IU]/mL (ref 0.450–4.500)

## 2023-09-15 LAB — HEMOGLOBIN A1C
Est. average glucose Bld gHb Est-mCnc: 114 mg/dL
Hgb A1c MFr Bld: 5.6 % (ref 4.8–5.6)

## 2023-09-15 LAB — PSA: Prostate Specific Ag, Serum: 0.7 ng/mL (ref 0.0–4.0)

## 2023-10-04 ENCOUNTER — Encounter: Payer: Self-pay | Admitting: Allergy and Immunology

## 2023-10-04 ENCOUNTER — Ambulatory Visit (INDEPENDENT_AMBULATORY_CARE_PROVIDER_SITE_OTHER): Payer: 59 | Admitting: Allergy and Immunology

## 2023-10-04 VITALS — BP 126/68 | HR 76 | Resp 12 | Ht 73.0 in | Wt 157.4 lb

## 2023-10-04 DIAGNOSIS — H608X2 Other otitis externa, left ear: Secondary | ICD-10-CM | POA: Diagnosis not present

## 2023-10-04 DIAGNOSIS — J324 Chronic pansinusitis: Secondary | ICD-10-CM

## 2023-10-04 DIAGNOSIS — G43909 Migraine, unspecified, not intractable, without status migrainosus: Secondary | ICD-10-CM | POA: Diagnosis not present

## 2023-10-04 DIAGNOSIS — J3089 Other allergic rhinitis: Secondary | ICD-10-CM | POA: Diagnosis not present

## 2023-10-04 MED ORDER — RYALTRIS 665-25 MCG/ACT NA SUSP: NASAL | 5 refills | Status: DC

## 2023-10-04 MED ORDER — MOMETASONE FUROATE 0.1 % EX CREA: TOPICAL_CREAM | CUTANEOUS | 3 refills | Status: DC

## 2023-10-04 NOTE — Patient Instructions (Addendum)
  1. Treat and prevent inflammation of airway and ear:   A. Ryaltris - 2 sprays each nostril 2 times per day (insurance???)  B. Mometasone 0.1% cream - inside left ear 3-7 times per week  2. Treat and prevent headaches:   A. Consolidate caffeine consumption slowly: Aim for none  3. If needed:   A. Xyzal 5 mg - 1 tablet 1-2 times per day  B. Nasal saline multiple times per day  4. Blood - area 2 aeroallergen profile, IgA/G/M  5. Return to clinic in 4 weeks or earlier if problem

## 2023-10-04 NOTE — Progress Notes (Signed)
[place] - [place] - [place]   Dear Dr. Leonia Reader,  Thank you for referring Ryan [name] to the [facility] [place] on 10/04/2023.   Below is a summation of this patient's evaluation and recommendations.  Thank you for your referral. I will keep you informed about this patient's response to treatment.   If you have any questions please do not hesitate to contact me.   Sincerely,  Jessica Priest, MD Allergy / Immunology [facility]   ______________________________________________________________________    NEW PATIENT NOTE  Referring Provider: Crist Fat, MD Primary Provider: Leonia Reader, Barbara Cower, MD Date of office visit: 10/04/2023    Subjective:   Chief Complaint:  Ryan [name] (DOB: [birth year]) is a 62 y.o. male who presents to the clinic on 10/04/2023 with a chief complaint of raspy voice and Headache .     HPI: [name] presents to this clinic in evaluation of respiratory tract issues.  He had apparently seen Dr. Delorse Lek in this clinic for a new patient appointment in 2019 without a return visit.  He complains of having a headache.  His headache is located between his eyes and behind his eyes and it gives him a "heaviness" without any scotoma or dizziness or nausea and occurs about 1 time per week if not 2 times per week for which he will take a Tylenol and usually his headache is gone within an hour or 2.  He complains of having persistent respiratory tract symptoms even in the face of using Flonase on a regular basis.  He is sneezing and sniffing and snorting and he is not entirely sure what is giving rise to this issue even though previous skin test have identified hypersensitivity against animals and mold.  He has apparently had a sinus surgery with Dr. Marcheta Grammes, ENT, which sounds as though it might have been repaired of a deviated septum, maxillary antrostomy, turbinate  reduction, and that has definitely helped his ability to pass air through his nose and smell.  He complains of having left ear discomfort and pain.  He has seen Dr. Marcheta Grammes in the past and no specific therapy was given for that issue.  He has constant left ear itching and fluid production from that ear.  He does not have any associated tinnitus or vertigo.  He just went through a bout of persistent hoarseness but fortunately that issue has completely resolved without any specific therapy.  He does have a history of reflux for which he took a proton pump inhibitor in the past but that issue has also completely resolved and he does not require any therapy for reflux in years.  Past Medical History:  Diagnosis Date   Anemia    hx   Anxiety    Arthritis    "joints of fingers; ankles" (04/29/2018)   BPH (benign prostatic hyperplasia)    Bulging lumbar disc dx'd 06/2016   Cellulitis of leg, right 04/28/2018   Chronic sinusitis    Daily headache    "daily lately; at least weekly" (04/29/2018)   Family history of breast cancer 01/16/2022   Family history of gastric cancer 01/16/2022   GERD (gastroesophageal reflux disease)    Hepatitis C ~ 2000   "turned yellow; jaundiced; not sure which kind of hepatitis" (04/29/2018)   IBS (irritable bowel syndrome)    Left lumbar radiculopathy    OCD (obsessive compulsive disorder)  OCD (obsessive compulsive disorder)    Overactive bladder    Reiter's arthritis involving lower leg, unspecified laterality (HCC)    Reiter's syndrome    Sacrococcygeal pain    Urethral stricture    Urge incontinence     Past Surgical History:  Procedure Laterality Date   CLOSED REDUCTION TOE FRACTURE Left 05/2014   "titanium plate in" 2nd digit   FRACTURE SURGERY     INGUINAL HERNIA REPAIR Left 06/2008   NASAL SEPTUM SURGERY  2016   PROSTATE SURGERY  2015   transurethral laser-induced prostatectomy   RHINOPLASTY  2018   TONSILLECTOMY AND ADENOIDECTOMY     TRANSURETHRAL  RESECTION OF PROSTATE      Allergies as of 10/04/2023       Reactions   Pollen Extract Other (See Comments)   Short Ragweed Pollen Ext Other (See Comments)   sneezing sneezing        Medication List    acetaminophen 500 MG tablet Commonly known as: TYLENOL Take 1,000 mg by mouth every 4 (four) hours as needed.   ASHWAGANDHA PO Take 1 tablet by mouth daily.   clobetasol 0.05 % external solution Commonly known as: TEMOVATE Apply topically.   co-enzyme Q-10 30 MG capsule Take 30 mg by mouth daily.   COLLAGEN EX Take 1 tablet by mouth daily.   CULTURELLE PROBIOTICS PO Take 1 tablet by mouth daily.   fluticasone 50 MCG/ACT nasal spray Commonly known as: FLONASE SHAKE LIQUID AND USE 1 SPRAY(50 MCG) IN EACH NOSTRIL TWICE DAILY   GINSENG PO Take by mouth daily.   levocetirizine 5 MG tablet Commonly known as: XYZAL Take 5 mg by mouth daily.   MAGNESIUM PO Take by mouth.   montelukast 10 MG tablet Commonly known as: SINGULAIR Take 1 tablet (10 mg total) by mouth daily.   multivitamin with minerals Tabs tablet Take 1 tablet by mouth daily.   NUTRITIONAL SUPPLEMENT PO Take by mouth. Testosterone supplement   QC Tumeric Complex 500 MG Caps Generic drug: Turmeric Take 1 tablet by mouth daily.   RED YEAST RICE PO Take 1 tablet by mouth daily.   triamcinolone cream 0.1 % Commonly known as: KENALOG Apply topically 2 (two) times daily.    Review of systems negative except as noted in HPI / PMHx or noted below:  Review of Systems  Constitutional: Negative.   HENT: Negative.    Eyes: Negative.   Respiratory: Negative.    Cardiovascular: Negative.   Gastrointestinal: Negative.   Genitourinary: Negative.   Musculoskeletal: Negative.   Skin: Negative.   Neurological: Negative.   Endo/Heme/Allergies: Negative.   Psychiatric/Behavioral: Negative.      Family History  Problem Relation Age of Onset   Stroke Mother    Allergic rhinitis Mother     Arrhythmia Mother    GER disease Mother    Stroke Father    Alzheimer's disease Father    Cancer Paternal Aunt        unknown type; dx unknown age   Breast cancer Maternal Grandmother        dx late 23s   Stomach cancer Maternal Grandfather        dx 78s   Skin cancer Maternal Grandfather        dx 79s; ear; surgery only   Cowden syndrome Other     Social History   Socioeconomic History   Marital status: Single    Spouse name: Not on file   Number of children: 0  Years of education: 14   Highest education level: Associate degree: occupational, Scientist, product/process development, or vocational program  Occupational History   Occupation: [employer]   Occupation: disabled Aeronautical engineer  Tobacco Use   Smoking status: Former    Current packs/day: 0.00    Average packs/day: 0.1 packs/day for 26.0 years (3.1 ttl pk-yrs)    Types: Cigarettes    Start date: 04/27/1985    Quit date: 04/28/2011    Years since quitting: 12.4   Smokeless tobacco: Never  Vaping Use   Vaping status: Former   Quit date: 04/14/2015  Substance and Sexual Activity   Alcohol use: Yes    Alcohol/week: 1.0 standard drink of alcohol    Types: 1 Standard drinks or equivalent per week    Comment: sometimes   Drug use: Not Currently    Types: Cocaine, Marijuana    Comment: 04/29/2018 "once in my 20's; nothing since"   Sexual activity: Not Currently  Other Topics Concern   Not on file  Social History Narrative   Lives alone   Right handed    1 cup daily of caffeine.    Environmental and Social history  Dan lives in a mobile home with a dry environment, dog and cat located inside the household, no carpet in the bedroom, no plastic on the bed, no plastic on the pillow, and no smoking ongoing with inside the household.  Objective:   Vitals:   10/04/23 1533  BP: 126/68  Pulse: 76  Resp: 12  SpO2: 96%   Height: 6\' 1"  (185.4 cm) Weight: 157 lb 6.4 oz (71.4 kg)  Physical Exam Constitutional:      Appearance: He is not  diaphoretic.  HENT:     Head: Normocephalic.     Right Ear: Tympanic membrane, ear canal and external ear normal.     Left Ear: External ear normal. Swelling (scale) present. A middle ear effusion (no light reflex) is present.     Nose: Nose normal. No mucosal edema or rhinorrhea.     Mouth/Throat:     Pharynx: Uvula midline. No oropharyngeal exudate.  Eyes:     Conjunctiva/sclera: Conjunctivae normal.  Neck:     Thyroid: No thyromegaly.     Trachea: Trachea normal. No tracheal tenderness or tracheal deviation.  Cardiovascular:     Rate and Rhythm: Normal rate and regular rhythm.     Heart sounds: Normal heart sounds, S1 normal and S2 normal. No murmur heard. Pulmonary:     Effort: No respiratory distress.     Breath sounds: Normal breath sounds. No stridor. No wheezing or rales.  Lymphadenopathy:     Head:     Right side of head: No tonsillar adenopathy.     Left side of head: No tonsillar adenopathy.     Cervical: No cervical adenopathy.  Skin:    Findings: No erythema or rash.     Nails: There is no clubbing.  Neurological:     Mental Status: He is alert.     Diagnostics: Allergy skin tests were not performed.   Results of blood tests obtained 14 September 2023 identifies WBC 4.1, absolute eosinophil 100, absolute basophil 0, absolute lymphocyte 1400, hemoglobin 15.4, platelet 245, creatinine 0.91 Mg/DL, AST 28U/X, ALT 32G/M  Results of a head MRI obtained 22 November 2018 identifies the following:  There has been bilateral antrectomy sinus surgery. Mucoperiosteal thickening is noted in the left maxillary sinus, some of the ethmoid air cells and the frontal sinuses.   Assessment and  Plan:    1. Other allergic rhinitis   2. Chronic pansinusitis   3. Migraine syndrome   4. Chronic eczematous otitis externa of left ear    1. Treat and prevent inflammation of airway and ear:   A. Ryaltris - 2 sprays each nostril 2 times per day (insurance???)  B. Mometasone 0.1% cream -  inside left ear 3-7 times per week  2. Treat and prevent headaches:   A. Consolidate caffeine consumption slowly: Aim for none  3. If needed:   A. Xyzal 5 mg - 1 tablet 1-2 times per day  B. Nasal saline multiple times per day  4. Blood - area 2 aeroallergen profile, IgA/G/M  5. Return to clinic in 4 weeks or earlier if problem  [name] has some inflammation of his upper airway and he has some inflammation of his left external auditory canal and he can use the therapy noted above to address these issues.  We will check blood test to investigate for his B-cell immune system function and possible overactivity.  He also appears to have chronic headache and I have asked him to consolidate his caffeine consumption and that should take care of that issue and if not he will require further evaluation and treatment.  I will see him back in this clinic in 4 weeks.  Jessica Priest, MD Allergy / Immunology [facility] [place]

## 2023-10-08 ENCOUNTER — Encounter: Payer: Self-pay | Admitting: Allergy and Immunology

## 2023-10-09 DIAGNOSIS — J324 Chronic pansinusitis: Secondary | ICD-10-CM | POA: Diagnosis not present

## 2023-10-09 DIAGNOSIS — H608X3 Other otitis externa, bilateral: Secondary | ICD-10-CM | POA: Diagnosis not present

## 2023-10-09 DIAGNOSIS — J3089 Other allergic rhinitis: Secondary | ICD-10-CM | POA: Diagnosis not present

## 2023-10-10 ENCOUNTER — Ambulatory Visit: Payer: Medicare Other | Admitting: Allergy and Immunology

## 2023-10-12 LAB — ALLERGENS W/TOTAL IGE AREA 2

## 2023-10-12 LAB — IGG, IGA, IGM
IgA/Immunoglobulin A, Serum: 177 mg/dL (ref 61–437)
IgG (Immunoglobin G), Serum: 1098 mg/dL (ref 603–1613)
IgM (Immunoglobulin M), Srm: 52 mg/dL (ref 20–172)

## 2023-10-16 NOTE — Progress Notes (Signed)
Tell him his labs look good but his cholesterol is still a bit elevated- watch fats in his diet.  Patient is aware of lab results

## 2023-11-05 ENCOUNTER — Ambulatory Visit: Payer: 59 | Admitting: Allergy and Immunology

## 2023-11-12 ENCOUNTER — Other Ambulatory Visit: Payer: Self-pay | Admitting: Internal Medicine

## 2023-11-28 DIAGNOSIS — L820 Inflamed seborrheic keratosis: Secondary | ICD-10-CM | POA: Diagnosis not present

## 2023-11-28 DIAGNOSIS — M6289 Other specified disorders of muscle: Secondary | ICD-10-CM | POA: Diagnosis not present

## 2023-11-28 DIAGNOSIS — N3281 Overactive bladder: Secondary | ICD-10-CM | POA: Diagnosis not present

## 2023-11-28 DIAGNOSIS — L570 Actinic keratosis: Secondary | ICD-10-CM | POA: Diagnosis not present

## 2023-11-28 DIAGNOSIS — C44329 Squamous cell carcinoma of skin of other parts of face: Secondary | ICD-10-CM | POA: Diagnosis not present

## 2023-12-05 ENCOUNTER — Ambulatory Visit: Payer: 59 | Admitting: Allergy and Immunology

## 2023-12-05 ENCOUNTER — Encounter: Payer: Self-pay | Admitting: Allergy and Immunology

## 2023-12-05 ENCOUNTER — Ambulatory Visit (INDEPENDENT_AMBULATORY_CARE_PROVIDER_SITE_OTHER): Payer: 59 | Admitting: Allergy and Immunology

## 2023-12-05 VITALS — BP 126/82 | HR 64 | Resp 16

## 2023-12-05 DIAGNOSIS — G43909 Migraine, unspecified, not intractable, without status migrainosus: Secondary | ICD-10-CM | POA: Diagnosis not present

## 2023-12-05 DIAGNOSIS — J3089 Other allergic rhinitis: Secondary | ICD-10-CM

## 2023-12-05 DIAGNOSIS — J324 Chronic pansinusitis: Secondary | ICD-10-CM

## 2023-12-05 DIAGNOSIS — H608X2 Other otitis externa, left ear: Secondary | ICD-10-CM

## 2023-12-05 NOTE — Patient Instructions (Addendum)
  1. Treat and prevent inflammation of airway and ear:   A. Flonase - 1-2 sprays each nostril daily  B. Does montelukast help???  2. Treat and prevent headaches:   A. Consolidate caffeine consumption slowly: Aim for none  3. If needed:   A. Xyzal 5 mg - 1 tablet 1-2 times per day  B. Nasal saline multiple times per day  C. Mometasone 0.1% cream - inside left ear 3-7 times per week  4. Return to clinic in 1 year or earlier if problem  5. Influenza = Tamiflu. Covid = Paxlovid

## 2023-12-05 NOTE — Progress Notes (Unsigned)
[place] - [place] - [place] - [place] - [place]   Follow-up Note  Referring Provider: Crist Fat, MD Primary Provider: Leonia Reader, Barbara Cower, MD Date of Office Visit: 12/05/2023  Subjective:   Ryan [name] (DOB: 1961-09-01) is a 63 y.o. male who [facility] 12[facility]/2023 in re-evaluation of the following:  HPI: Ryan [name] returns to this clinic in evaluation of rhinitis, chronic otitis externa, headache, history of chronic pansinusitis.  I last saw him in this clinic 04 October 2023.  He is doing better with his headaches.  He has a low-grade nagging headache about once a week since he is consolidate his caffeine to just a half a cup of coffee per day.  He could not tolerate the taste of his combination nasal steroid/antihistamine and now he is using Flonase.  He is doing pretty well regarding his upper airway symptoms at this point.  He also takes montelukast but is not really sure that montelukast helps him at all.  He does use nasal saline on a pretty consistent basis.  He has placed on mometasone in his ear canal and this has worked really well and he does not require this medication at this point in time.  Allergies as of 12/05/2023       Reactions   Pollen Extract Other (See Comments)   Short Ragweed Pollen Ext Other (See Comments)   sneezing sneezing        Medication List    acetaminophen 500 MG tablet Commonly known as: TYLENOL Take 1,000 mg by mouth every 4 (four) hours as needed.   ASHWAGANDHA PO Take 1 tablet by mouth daily.   co-enzyme Q-10 30 MG capsule Take 30 mg by mouth daily.   CULTURELLE PROBIOTICS PO Take 1 tablet by mouth daily.   fluticasone 50 MCG/ACT nasal spray Commonly known as: FLONASE Place into both nostrils daily.   levocetirizine 5 MG tablet Commonly known as: XYZAL Take 5 mg by mouth daily.   montelukast 10 MG tablet Commonly known as: SINGULAIR TAKE 1 TABLET(10 MG) BY MOUTH DAILY    multivitamin with minerals Tabs tablet Take 1 tablet by mouth daily.   QC Tumeric Complex 500 MG Caps Generic drug: Turmeric Take 1 tablet by mouth daily.   RED YEAST RICE PO Take 1 tablet by mouth daily.    Past Medical History:  Diagnosis Date   Anemia    hx   Anxiety    Arthritis    "joints of fingers; ankles" (04/29/2018)   BPH (benign prostatic hyperplasia)    Bulging lumbar disc dx'd 06/2016   Cellulitis of leg, right 04/28/2018   Chronic sinusitis    Daily headache    "daily lately; at least weekly" (04/29/2018)   Family history of breast cancer 01/16/2022   Family history of gastric cancer 01/16/2022   GERD (gastroesophageal reflux disease)    Hepatitis C ~ 2000   "turned yellow; jaundiced; not sure which kind of hepatitis" (04/29/2018)   IBS (irritable bowel syndrome)    Left lumbar radiculopathy    OCD (obsessive compulsive disorder)    OCD (obsessive compulsive disorder)    Overactive bladder    Reiter's arthritis involving lower leg, unspecified laterality (HCC)    Reiter's syndrome    Sacrococcygeal pain    Urethral stricture    Urge incontinence     Past Surgical History:  Procedure Laterality Date   CLOSED REDUCTION TOE FRACTURE Left 05/2014   "titanium plate in"  2nd digit   FRACTURE SURGERY     INGUINAL HERNIA REPAIR Left 06/2008   NASAL SEPTUM SURGERY  2016   PROSTATE SURGERY  2015   transurethral laser-induced prostatectomy   RHINOPLASTY  2018   TONSILLECTOMY AND ADENOIDECTOMY     TRANSURETHRAL RESECTION OF PROSTATE      Review of systems negative except as noted in HPI / PMHx or noted below:  Review of Systems  Constitutional: Negative.   HENT: Negative.    Eyes: Negative.   Respiratory: Negative.    Cardiovascular: Negative.   Gastrointestinal: Negative.   Genitourinary: Negative.   Musculoskeletal: Negative.   Skin: Negative.   Neurological: Negative.   Endo/Heme/Allergies: Negative.   Psychiatric/Behavioral: Negative.        Objective:   Vitals:   12/05/23 1419  BP: 126/82  Pulse: 64  Resp: 16  SpO2: 98%          Physical Exam Constitutional:      Appearance: He is not diaphoretic.  HENT:     Head: Normocephalic.     Right Ear: Tympanic membrane, ear canal and external ear normal.     Left Ear: Tympanic membrane, ear canal and external ear normal.     Nose: Nose normal. No mucosal edema or rhinorrhea.     Mouth/Throat:     Pharynx: Uvula midline. No oropharyngeal exudate.  Eyes:     Conjunctiva/sclera: Conjunctivae normal.  Neck:     Thyroid: No thyromegaly.     Trachea: Trachea normal. No tracheal tenderness or tracheal deviation.  Cardiovascular:     Rate and Rhythm: Normal rate and regular rhythm.     Heart sounds: Normal heart sounds, S1 normal and S2 normal. No murmur heard. Pulmonary:     Effort: No respiratory distress.     Breath sounds: Normal breath sounds. No stridor. No wheezing or rales.  Lymphadenopathy:     Head:     Right side of head: No tonsillar adenopathy.     Left side of head: No tonsillar adenopathy.     Cervical: No cervical adenopathy.  Skin:    Findings: No erythema or rash.     Nails: There is no clubbing.  Neurological:     Mental Status: He is alert.     Diagnostics:    Results of blood tests obtained 09 October 2023 identified IgG 1098 Mg/DL, IgA 1 469 Mg/DL, IgM 52 Mg/DL, IgE 50 KU/L, no antigen specific IgE antibodies identified in an area two aero allergen profile.  Assessment and Plan:   1. Other allergic rhinitis   2. Chronic pansinusitis   3. Migraine syndrome   4. Chronic eczematous otitis externa of left ear    1. Treat and prevent inflammation of airway and ear:   A. Flonase - 1-2 sprays each nostril daily  B. Does montelukast help???  2. Treat and prevent headaches:   A. Consolidate caffeine consumption slowly: Aim for none  3. If needed:   A. Xyzal 5 mg - 1 tablet 1-2 times per day  B. Nasal saline multiple times per  day  C. Mometasone 0.1% cream - inside left ear 3-7 times per week  4. Return to clinic in 1 year or earlier if problem  5. Influenza = Tamiflu. Covid = Paxlovid  I think that Ryan [name] is doing okay.  There is a few issues he needs to work through.  He is to see if montelukast actually helps him and if not he can discontinue this agent.  He can remain on a nasal steroid and I have encouraged him to aim for no caffeine consumption to help with his headaches.  And he can always use some topical mometasone for his chronic otitis externa.  If he does well we will see him back in this clinic in 1 year or earlier if there is a problem.  Laurette Schimke, MD Allergy / Immunology [facility]

## 2023-12-06 ENCOUNTER — Encounter: Payer: Self-pay | Admitting: Allergy and Immunology

## 2023-12-19 DIAGNOSIS — N3281 Overactive bladder: Secondary | ICD-10-CM | POA: Diagnosis not present

## 2023-12-19 DIAGNOSIS — M6289 Other specified disorders of muscle: Secondary | ICD-10-CM | POA: Diagnosis not present

## 2023-12-24 DIAGNOSIS — M5116 Intervertebral disc disorders with radiculopathy, lumbar region: Secondary | ICD-10-CM | POA: Diagnosis not present

## 2023-12-27 DIAGNOSIS — M4807 Spinal stenosis, lumbosacral region: Secondary | ICD-10-CM | POA: Diagnosis not present

## 2023-12-27 DIAGNOSIS — M48061 Spinal stenosis, lumbar region without neurogenic claudication: Secondary | ICD-10-CM | POA: Diagnosis not present

## 2023-12-27 DIAGNOSIS — M5116 Intervertebral disc disorders with radiculopathy, lumbar region: Secondary | ICD-10-CM | POA: Diagnosis not present

## 2023-12-29 ENCOUNTER — Encounter (INDEPENDENT_AMBULATORY_CARE_PROVIDER_SITE_OTHER): Payer: Self-pay

## 2024-01-11 ENCOUNTER — Encounter: Payer: Self-pay | Admitting: Internal Medicine

## 2024-01-11 ENCOUNTER — Ambulatory Visit: Payer: 59 | Admitting: Internal Medicine

## 2024-01-11 VITALS — BP 110/70 | HR 72 | Temp 97.2°F | Resp 18 | Ht 74.0 in | Wt 156.4 lb

## 2024-01-11 DIAGNOSIS — F330 Major depressive disorder, recurrent, mild: Secondary | ICD-10-CM

## 2024-01-11 DIAGNOSIS — F411 Generalized anxiety disorder: Secondary | ICD-10-CM

## 2024-01-11 DIAGNOSIS — J309 Allergic rhinitis, unspecified: Secondary | ICD-10-CM | POA: Diagnosis not present

## 2024-01-11 DIAGNOSIS — F429 Obsessive-compulsive disorder, unspecified: Secondary | ICD-10-CM | POA: Diagnosis not present

## 2024-01-11 NOTE — Assessment & Plan Note (Signed)
 -  As below

## 2024-01-11 NOTE — Assessment & Plan Note (Signed)
 I reviewed his notes from Dr. Lucie Leather.  He will continue on his medications and raspy voice has improved.

## 2024-01-11 NOTE — Progress Notes (Signed)
 Office Visit  Subjective   Patient ID: Ryan [name]   DOB: 11/30/1960   Age: 63 y.o.   MRN: [id number]   Chief Complaint Chief Complaint  Patient presents with   Follow-up    4 month      History of Present Illness Mr. Quezada is a 63 yo male who returns today where I saw him 4 months ago for allergies and complaints of a "raspy voice".  We referred him to Dr. Lucie Leather where he saw him initially in 09/2023 and saw him in followup on 11/2023.  His raspy voice has improved since his last visit.  He has allergic rhinitis where he is on flonase and singulair.  He can take xyzal as needed and nasal saline irrigation and mometasone cream for his ear.  He did allergy testing on him as well.  Unfortunately the patient contracted COVID-19 about 12 days ago and he is currently recovering from this.  The patient has a history of intermittent bouts of rhinitis and sinusitis felt to be due to weather or environmental exposures. The patient's past medical history is noncontributory. He has used Flonase and nasal saline washes per ENT's recommendations.  He has seen ENT and had rigid endoscopy on 05/13/2021 where he saw no evidence of recurrent sinus infection.  The patient also has a history of allergic rhinitis.  He has seen Dr. Marcheta Grammes in ENT where they did nasal endoscopy. He has also seen Dr. Delorse Lek in Allergy in 02/2018 and they did allergy testing where he is allergy to dogs, cats and mold.     The patient returns for followup of his depression and anxiety as well as OCD.   He is seeing a psychiatrist in [place] who is putting him thru a program called [facility].  He is currently not on any medications at this time.  He states he an option for cognitive behavioral therapy when he spoke to his provider last time.  Today, he states his depression is mild and his anxiety is moderate. He was on viibryd but stopped this and he is currently just on supplments.  He denies any panic attacks.  He has a history of  anxiety/OCD/Depression.  However, he states he is now having more OCD patterns and is requesting to go back on fluvoxamine.  He was placed on this for his GAD/OCD in early summer of 2022.  He was seeing a psychiatrist in [place] where they started him on Remeron.  He no longer goes to this psychiatrist and is looking going back to [place].   He is currently on disability due to his OCD where he has been on disability since 2008. He was on fluvoxamine (Luvox) for about 15 years. He was followed by [facility] where he is seeing a Haematologist and psychiatrist in [place] at this time. He denies helpless feeling and suicidal ideation. This patient feels that she is able to care for himself. He currently lives alone. He has a history of being admitted to a psychiatric hospital in 1990's. There was no suicidal ideation at that time.        Past Medical History Past Medical History:  Diagnosis Date   Anemia    hx   Anxiety    Arthritis    "joints of fingers; ankles" (04/29/2018)   BPH (benign prostatic hyperplasia)    Bulging lumbar disc dx'd 06/2016   Cellulitis of leg, right 04/28/2018   Chronic sinusitis    Daily headache    "daily lately;  at least weekly" (04/29/2018)   Family history of breast cancer 01/16/2022   Family history of gastric cancer 01/16/2022   GERD (gastroesophageal reflux disease)    Hepatitis C ~ 2000   "turned yellow; jaundiced; not sure which kind of hepatitis" (04/29/2018)   IBS (irritable bowel syndrome)    Left lumbar radiculopathy    OCD (obsessive compulsive disorder)    OCD (obsessive compulsive disorder)    Overactive bladder    Reiter's arthritis involving lower leg, unspecified laterality (HCC)    Reiter's syndrome    Sacrococcygeal pain    Urethral stricture    Urge incontinence      Allergies Allergies  Allergen Reactions   Pollen Extract Other (See Comments)   Short Ragweed Pollen Ext Other (See Comments)    sneezing sneezing       Medications  Current Outpatient Medications:    acetaminophen (TYLENOL) 500 MG tablet, Take 1,000 mg by mouth every 4 (four) hours as needed., Disp: , Rfl:    ASHWAGANDHA PO, Take 1 tablet by mouth daily., Disp: , Rfl:    co-enzyme Q-10 30 MG capsule, Take 30 mg by mouth daily., Disp: , Rfl:    fluticasone (FLONASE) 50 MCG/ACT nasal spray, Place into both nostrils daily., Disp: , Rfl:    levocetirizine (XYZAL) 5 MG tablet, Take 5 mg by mouth daily., Disp: , Rfl:    montelukast (SINGULAIR) 10 MG tablet, TAKE 1 TABLET(10 MG) BY MOUTH DAILY, Disp: 30 tablet, Rfl: 2   Multiple Vitamin (MULTIVITAMIN WITH MINERALS) TABS tablet, Take 1 tablet by mouth daily., Disp: , Rfl:    Probiotic Product (CULTURELLE PROBIOTICS PO), Take 1 tablet by mouth daily., Disp: , Rfl:    Red Yeast Rice Extract (RED YEAST RICE PO), Take 1 tablet by mouth daily., Disp: , Rfl:    Turmeric (QC TUMERIC COMPLEX) 500 MG CAPS, Take 1 tablet by mouth daily., Disp: , Rfl:    Review of Systems Review of Systems  Constitutional:  Negative for chills and fever.  Respiratory:  Positive for cough. Negative for shortness of breath.   Cardiovascular:  Negative for chest pain and palpitations.  Gastrointestinal:  Negative for abdominal pain, constipation, diarrhea, nausea and vomiting.  Musculoskeletal:  Negative for myalgias.  Skin:  Negative for rash.  Neurological:  Negative for dizziness, weakness and headaches.       Objective:    Vitals BP 110/70 (BP Location: Left Arm, Patient Position: Sitting, Cuff Size: Normal)   Pulse 72   Temp (!) 97.2 F (36.2 C)   Resp 18   Ht 6\' 2"  (1.88 m)   Wt 156 lb 6 oz (70.9 kg)   SpO2 97%   BMI 20.08 kg/m    Physical Examination Physical Exam Constitutional:      Appearance: Normal appearance. He is not ill-appearing.  Cardiovascular:     Rate and Rhythm: Normal rate and regular rhythm.     Pulses: Normal pulses.     Heart sounds: No murmur heard.    No friction rub. No  gallop.  Pulmonary:     Effort: Pulmonary effort is normal. No respiratory distress.     Breath sounds: No wheezing, rhonchi or rales.  Abdominal:     General: Abdomen is flat. Bowel sounds are normal. There is no distension.     Palpations: Abdomen is soft.     Tenderness: There is no abdominal tenderness.  Musculoskeletal:     Right lower leg: No edema.  Left lower leg: No edema.  Skin:    General: Skin is warm and dry.     Findings: No rash.  Neurological:     Mental Status: He is alert.        Assessment & Plan:   Allergic rhinitis I reviewed his notes from Dr. Lucie Leather.  He will continue on his medications and raspy voice has improved.  GAD (generalized anxiety disorder) As below.  OCD (obsessive compulsive disorder) As below.  Mild episode of recurrent major depressive disorder [facility]) He is seeing psychiatry and is getting in a new program.  They have also discussed CBT.  He is not on any medications.  He seems to be stable and no change from his last visit.    No follow-ups on file.   Crist Fat, MD

## 2024-01-11 NOTE — Assessment & Plan Note (Addendum)
 He is seeing psychiatry and is getting in a new program.  They have also discussed CBT.  He is not on any medications.  He seems to be stable and no change from his last visit.  He is now being followed by a group called [facility].

## 2024-01-17 DIAGNOSIS — I872 Venous insufficiency (chronic) (peripheral): Secondary | ICD-10-CM | POA: Diagnosis not present

## 2024-01-17 DIAGNOSIS — I83813 Varicose veins of bilateral lower extremities with pain: Secondary | ICD-10-CM | POA: Diagnosis not present

## 2024-01-21 ENCOUNTER — Other Ambulatory Visit: Payer: Self-pay

## 2024-01-21 MED ORDER — MONTELUKAST SODIUM 10 MG PO TABS: 10.0000 mg | ORAL_TABLET | Freq: Every day | ORAL | 2 refills | Status: DC

## 2024-02-12 DIAGNOSIS — M5116 Intervertebral disc disorders with radiculopathy, lumbar region: Secondary | ICD-10-CM | POA: Diagnosis not present

## 2024-02-12 DIAGNOSIS — R209 Unspecified disturbances of skin sensation: Secondary | ICD-10-CM | POA: Diagnosis not present

## 2024-02-19 ENCOUNTER — Other Ambulatory Visit: Payer: Self-pay | Admitting: Internal Medicine

## 2024-02-20 ENCOUNTER — Ambulatory Visit (HOSPITAL_BASED_OUTPATIENT_CLINIC_OR_DEPARTMENT_OTHER): Admission: EM | Admit: 2024-02-20 | Discharge: 2024-02-20 | Disposition: A | Discharge status: Home / Self Care

## 2024-02-21 ENCOUNTER — Ambulatory Visit (HOSPITAL_BASED_OUTPATIENT_CLINIC_OR_DEPARTMENT_OTHER)
Admission: RE | Admit: 2024-02-21 | Discharge: 2024-02-21 | Disposition: A | Discharge status: Home / Self Care | Source: Ambulatory Visit | Attending: Emergency Medicine | Admitting: Emergency Medicine

## 2024-02-21 ENCOUNTER — Encounter (HOSPITAL_BASED_OUTPATIENT_CLINIC_OR_DEPARTMENT_OTHER): Payer: Self-pay

## 2024-02-21 VITALS — BP 144/77 | HR 64 | Temp 97.8°F | Resp 18

## 2024-02-21 DIAGNOSIS — M25552 Pain in left hip: Secondary | ICD-10-CM

## 2024-02-21 MED ORDER — DEXAMETHASONE SODIUM PHOSPHATE 10 MG/ML IJ SOLN
10.0000 mg | Freq: Once | INTRAMUSCULAR | Status: AC
  Administered 2024-02-21: 10 mg via INTRAMUSCULAR

## 2024-02-21 NOTE — ED Provider Notes (Signed)
 [facility]    CSN: [id number] Arrival date & time: 02/21/24  0951      History   Chief Complaint Chief Complaint  Patient presents with   Hip Pain    HPI Ryan [name] is a 63 y.o. male.   Presents to clinic over concerns of left lateral hip pain.  He was [place]loading some limbs off a [place] around 2 PM when he turned abruptly and twisted and had immediate pain in his left hip area.  He has been ambulatory since then but certain motions cause a sharp stabbing pain.  Patient is concerned that he tore his labrum in his hip.  Does not have any bruising or overlying skin changes.  He has not taken any medicine or tried any interventions for his symptoms.  Today he noticed that the pain has improved some.  The history is provided by the patient and medical records.  Hip Pain    Past Medical History:  Diagnosis Date   Anemia    hx   Anxiety    Arthritis    "joints of fingers; ankles" (04/29/2018)   BPH (benign prostatic hyperplasia)    Bulging lumbar disc dx'd 06/2016   Cellulitis of leg, right 04/28/2018   Chronic sinusitis    Daily headache    "daily lately; at least weekly" (04/29/2018)   Family history of breast cancer 01/16/2022   Family history of gastric cancer 01/16/2022   GERD (gastroesophageal reflux disease)    Hepatitis C ~ 2000   "turned yellow; jaundiced; not sure which kind of hepatitis" (04/29/2018)   IBS (irritable bowel syndrome)    Left lumbar radiculopathy    OCD (obsessive compulsive disorder)    OCD (obsessive compulsive disorder)    Overactive bladder    Reiter's arthritis involving lower leg, unspecified laterality (HCC)    Reiter's syndrome    Sacrococcygeal pain    Urethral stricture    Urge incontinence     Patient Active Problem List   Diagnosis Date Noted   BMI less than 19,adult 09/14/2023   Gastroesophageal reflux disease 09/14/2023   Degeneration of intervertebral disc of lumbar region with discogenic  back pain 09/14/2023   Hypercholesterolemia 09/14/2023   GAD (generalized anxiety disorder) 09/14/2023   Annual physical exam 09/14/2023   OAB (overactive bladder) 09/14/2023   Allergic rhinitis 09/14/2023   Pain in both lower extremities 09/14/2023   Mild episode of recurrent major depressive disorder (HCC) 09/14/2023   Hoarseness of voice 08/17/2023   Numbness and tingling of both legs 05/23/2023   Rash 05/23/2023   Acute non-recurrent maxillary sinusitis 04/04/2023   Dysfunction of left eustachian tube 01/15/2023   Leg laceration, left, subsequent encounter 09/27/2022   Hand pain, right 09/27/2022   Genetic testing 03/14/2022   Family history of gastric cancer 01/16/2022   Family history of breast cancer 01/16/2022   Flank pain 12/10/2019   Sacrococcygeal pain 10/24/2019   Left lumbar radiculopathy 03/22/2018   Post-void dribbling 01/16/2017   Urethral stricture 01/16/2017   Urge incontinence 10/07/2014   OCD (obsessive compulsive disorder) 10/01/2013   BPH (benign prostatic hyperplasia) 06/23/2013   Overactive bladder 06/13/2012    Past Surgical History:  Procedure Laterality Date   CLOSED REDUCTION TOE FRACTURE Left 05/2014   "titanium plate in" 2nd digit   FRACTURE SURGERY     INGUINAL HERNIA REPAIR Left 06/2008   NASAL SEPTUM SURGERY  2016   PROSTATE SURGERY  2015   transurethral laser-induced  prostatectomy   RHINOPLASTY  2018   TONSILLECTOMY AND ADENOIDECTOMY     TRANSURETHRAL RESECTION OF PROSTATE         Home Medications    Prior to Admission medications   Medication Sig Start Date End Date Taking? Authorizing Provider  fluticasone (FLONASE) 50 MCG/ACT nasal spray SHAKE LIQUID AND USE 1 SPRAY(50 MCG) IN EACH NOSTRIL TWICE DAILY 02/19/24  Yes Crist Fat, MD  montelukast (SINGULAIR) 10 MG tablet Take 1 tablet (10 mg total) by mouth at bedtime. 01/21/24  Yes Crist Fat, MD  acetaminophen (TYLENOL) 500 MG tablet Take 1,000 mg by mouth every 4 (four)  hours as needed. 03/31/16   [provider]  ASHWAGANDHA PO Take 1 tablet by mouth daily.    [provider]  co-enzyme Q-10 30 MG capsule Take 30 mg by mouth daily.    [provider]  levocetirizine (XYZAL) 5 MG tablet Take 5 mg by mouth daily.    [provider]  Multiple Vitamin (MULTIVITAMIN WITH MINERALS) TABS tablet Take 1 tablet by mouth daily.    [provider]  Probiotic Product (CULTURELLE PROBIOTICS PO) Take 1 tablet by mouth daily.    [provider]  Red Yeast Rice Extract (RED YEAST RICE PO) Take 1 tablet by mouth daily.    [provider]  Turmeric (QC TUMERIC COMPLEX) 500 MG CAPS Take 1 tablet by mouth daily.    [provider]    Family History Family History  Problem Relation Age of Onset   Stroke Mother    Allergic rhinitis Mother    Arrhythmia Mother    GER disease Mother    Stroke Father    Alzheimer's disease Father    Cancer Paternal Aunt        unknown type; dx unknown age   Breast cancer Maternal Grandmother        dx late 40s   Stomach cancer Maternal Grandfather        dx 4s   Skin cancer Maternal Grandfather        dx 7s; ear; surgery only   Cowden syndrome Other     Social History Social History   Tobacco Use   Smoking status: Former    Current packs/day: 0.00    Average packs/day: 0.1 packs/day for 26.0 years (3.1 ttl pk-yrs)    Types: Cigarettes    Start date: 04/27/1985    Quit date: 04/28/2011    Years since quitting: 12.8   Smokeless tobacco: Never  Vaping Use   Vaping status: Former   Quit date: 04/14/2015  Substance Use Topics   Alcohol use: Yes    Alcohol/week: 1.0 standard drink of alcohol    Types: 1 Standard drinks or equivalent per week    Comment: sometimes   Drug use: Not Currently    Types: Cocaine, Marijuana    Comment: 04/29/2018 "once in my 20's; nothing since"     Allergies   Bee pollen, Pollen extract, and Short ragweed pollen ext   Review  of Systems Review of Systems  Per HPI  Physical Exam Triage Vital Signs ED Triage Vitals  Encounter Vitals Group     BP 02/21/24 1001 (!) 144/77     Systolic BP Percentile --      Diastolic BP Percentile --      Pulse Rate 02/21/24 1001 64     Resp 02/21/24 1001 18     Temp 02/21/24 1001 97.8 F (36.6 C)  Temp Source 02/21/24 1001 Oral     SpO2 02/21/24 1001 98 %     Weight --      Height --      Head Circumference --      Peak Flow --      Pain Score 02/21/24 0959 8     Pain Loc --      Pain Education --      Exclude from Growth Chart --    No data found.  Updated Vital Signs BP (!) 144/77 (BP Location: Right Arm)   Pulse 64   Temp 97.8 F (36.6 C) (Oral)   Resp 18   SpO2 98%   Visual Acuity Right Eye Distance:   Left Eye Distance:   Bilateral Distance:    Right Eye Near:   Left Eye Near:    Bilateral Near:     Physical Exam Vitals and nursing note reviewed.  Constitutional:      Appearance: Normal appearance.  HENT:     Head: Normocephalic and atraumatic.     Right Ear: External ear normal.     Left Ear: External ear normal.     Nose: Nose normal.     Mouth/Throat:     Mouth: Mucous membranes are moist.  Eyes:     Conjunctiva/sclera: Conjunctivae normal.  Musculoskeletal:        General: Signs of injury present. Normal range of motion.     Left hip: Normal range of motion.       Legs:  Neurological:     General: No focal deficit present.     Mental Status: He is alert.  Psychiatric:        Mood and Affect: Mood is anxious.        Behavior: Behavior is cooperative.      UC Treatments / Results  Labs (all labs ordered are listed, but only abnormal results are displayed) Labs Reviewed - No data to display  EKG   Radiology No results found.  Procedures Procedures (including critical care time)  Medications Ordered in UC Medications  dexamethasone (DECADRON) injection 10 mg (has no administration in time range)    Initial  Impression / Assessment and Plan / UC Course  I have reviewed the triage vital signs and the nursing notes.  Pertinent labs & imaging results that were available during my care of the patient were reviewed by me and considered in my medical decision making (see chart for details).  Vitals in triage reviewed, patient is hemodynamically stable.  Overall patient is anxious appearing.  Ambulatory.  Twisting injury yesterday caused immediate left lateral hip pain.  Ambulatory, certain movements will trigger sharp stabbing pain.  Discussed in the setting that x-ray would not be beneficial at this time.  Will trial steroid injection to help diminish inflammation.  Pain management discussed.  Encouraged orthopedic follow-up for further advanced evaluation.  Plan of care, follow-up care return precautions given, no questions at this time.     Final Clinical Impressions(s) / UC Diagnoses   Final diagnoses:  Left hip pain     Discharge Instructions      We have given you a steroid shot today in clinic to help with pain and inflammation.  This should start working in the next 30 minutes or so.  Please modify your activity over the weekend, you can ice the area.  Follow-up with your orthopedic for further advanced imaging and investigation into your left hip pain.  For any breakthrough pain  you can alternate between 600 mg of ibuprofen and 500 mg of Tylenol every 4-6 hours.  Return to clinic for any new or urgent symptoms.   ED Prescriptions   None    PDMP not reviewed this encounter.   Ever Gustafson, [place], [place] 02/21/24 1025

## 2024-02-21 NOTE — Discharge Instructions (Signed)
 We have given you a steroid shot today in clinic to help with pain and inflammation.  This should start working in the next 30 minutes or so.  Please modify your activity over the weekend, you can ice the area.  Follow-up with your orthopedic for further advanced imaging and investigation into your left hip pain.  For any breakthrough pain you can alternate between 600 mg of ibuprofen and 500 mg of Tylenol every 4-6 hours.  Return to clinic for any new or urgent symptoms.

## 2024-02-21 NOTE — ED Triage Notes (Signed)
 Pt was [place] limbs [place] the [place] yesterday and he turned abruptly and now he has pain to left hip area.

## 2024-02-22 ENCOUNTER — Ambulatory Visit: Admitting: Internal Medicine

## 2024-02-22 ENCOUNTER — Encounter: Payer: Self-pay | Admitting: Internal Medicine

## 2024-02-22 VITALS — BP 120/72 | HR 67 | Temp 98.4°F | Resp 16 | Ht 74.0 in | Wt 161.0 lb

## 2024-02-22 DIAGNOSIS — T148XXA Other injury of unspecified body region, initial encounter: Secondary | ICD-10-CM | POA: Diagnosis not present

## 2024-02-22 MED ORDER — DICLOFENAC SODIUM 50 MG PO TBEC: 50.0000 mg | DELAYED_RELEASE_TABLET | Freq: Two times a day (BID) | ORAL | 0 refills | Status: AC

## 2024-02-22 NOTE — Progress Notes (Signed)
 Office Visit  Subjective   Patient ID: Ryan [name]   DOB: 1961-03-22   Age: 63 y.o.   MRN: [id number]   Chief Complaint Chief Complaint  Patient presents with   Acute Visit    F/U urgent care     History of Present Illness The patient returns today after seeing [facility] yesterday.  He was at work and was unloading a truck when he jerked a tree limb backwards and then had immediately pain over his left lateral hip pain.  He states he turned abruptly and twisted and had immediate pain in his left hip area.  He has been ambulatory since then but certain motions cause a sharp stabbing pain.  Patient is concerned that he tore his labrum in his hip.  Does not have any bruising or overlying skin changes.  He had a steriod injection of his left hip.  He wonders if he has labrum or tendon tear and he wants a referral to ortho.  He states when he woke up, he felt more pain today.       Past Medical History Past Medical History:  Diagnosis Date   Anemia    hx   Anxiety    Arthritis    "joints of fingers; ankles" (04/29/2018)   BPH (benign prostatic hyperplasia)    Bulging lumbar disc dx'd 06/2016   Cellulitis of leg, right 04/28/2018   Chronic sinusitis    Daily headache    "daily lately; at least weekly" (04/29/2018)   Family history of breast cancer 01/16/2022   Family history of gastric cancer 01/16/2022   GERD (gastroesophageal reflux disease)    Hepatitis C ~ 2000   "turned yellow; jaundiced; not sure which kind of hepatitis" (04/29/2018)   IBS (irritable bowel syndrome)    Left lumbar radiculopathy    OCD (obsessive compulsive disorder)    OCD (obsessive compulsive disorder)    Overactive bladder    Reiter's arthritis involving lower leg, unspecified laterality (HCC)    Reiter's syndrome    Sacrococcygeal pain    Urethral stricture    Urge incontinence      Allergies Allergies  Allergen Reactions   Bee Pollen Other (See Comments)   Pollen Extract Other (See  Comments)   Short Ragweed Pollen Ext Other (See Comments)    sneezing sneezing      Medications  Current Outpatient Medications:    acetaminophen (TYLENOL) 500 MG tablet, Take 1,000 mg by mouth every 4 (four) hours as needed., Disp: , Rfl:    ASHWAGANDHA PO, Take 1 tablet by mouth daily., Disp: , Rfl:    co-enzyme Q-10 30 MG capsule, Take 30 mg by mouth daily., Disp: , Rfl:    fluticasone (FLONASE) 50 MCG/ACT nasal spray, SHAKE LIQUID AND USE 1 SPRAY(50 MCG) IN EACH NOSTRIL TWICE DAILY, Disp: 48 g, Rfl: 3   levocetirizine (XYZAL) 5 MG tablet, Take 5 mg by mouth daily., Disp: , Rfl:    montelukast (SINGULAIR) 10 MG tablet, Take 1 tablet (10 mg total) by mouth at bedtime., Disp: 30 tablet, Rfl: 2   Multiple Vitamin (MULTIVITAMIN WITH MINERALS) TABS tablet, Take 1 tablet by mouth daily., Disp: , Rfl:    Probiotic Product (CULTURELLE PROBIOTICS PO), Take 1 tablet by mouth daily., Disp: , Rfl:    Red Yeast Rice Extract (RED YEAST RICE PO), Take 1 tablet by mouth daily., Disp: , Rfl:    Turmeric (QC TUMERIC COMPLEX) 500 MG CAPS, Take 1 tablet by  mouth daily., Disp: , Rfl:    Review of Systems Review of Systems  Gastrointestinal:  Negative for abdominal pain, constipation, diarrhea, nausea and vomiting.  Musculoskeletal:  Negative for back pain, falls, myalgias and neck pain.  Neurological:  Positive for dizziness. Negative for weakness and headaches.       Objective:    Vitals BP 120/72   Pulse 67   Temp 98.4 F (36.9 C)   Resp 16   Ht 6\' 2"  (1.88 m)   Wt 161 lb (73 kg)   SpO2 95%   BMI 20.67 kg/m    Physical Examination Physical Exam Constitutional:      Appearance: Normal appearance. He is not ill-appearing.  Cardiovascular:     Rate and Rhythm: Normal rate and regular rhythm.     Pulses: Normal pulses.     Heart sounds: No murmur heard.    No friction rub. No gallop.  Pulmonary:     Effort: Pulmonary effort is normal. No respiratory distress.     Breath sounds: No  wheezing, rhonchi or rales.  Abdominal:     General: Abdomen is flat. Bowel sounds are normal. There is no distension.     Palpations: Abdomen is soft.     Tenderness: There is no abdominal tenderness.  Musculoskeletal:     Right lower leg: No edema.     Left lower leg: No edema.     Comments: He has pain just above his left greater trochanter.  Skin:    General: Skin is warm and dry.     Findings: No rash.  Neurological:     Mental Status: He is alert.        Assessment & Plan:   Muscle strain I think he has strained his left hip.  I am going to place him on diclofenac and we got him schedule to see ortho on Tuesday.    No follow-ups on file.   Crist Fat, MD

## 2024-02-22 NOTE — Assessment & Plan Note (Signed)
 I think he has strained his left hip.  I am going to place him on diclofenac and we got him schedule to see ortho on Tuesday.

## 2024-02-26 DIAGNOSIS — M25552 Pain in left hip: Secondary | ICD-10-CM | POA: Diagnosis not present

## 2024-02-27 ENCOUNTER — Other Ambulatory Visit: Payer: Self-pay | Admitting: Physician Assistant

## 2024-02-27 DIAGNOSIS — M25552 Pain in left hip: Secondary | ICD-10-CM

## 2024-02-29 ENCOUNTER — Ambulatory Visit
Admission: RE | Admit: 2024-02-29 | Discharge: 2024-02-29 | Disposition: A | Discharge status: Home / Self Care | Source: Ambulatory Visit | Attending: Physician Assistant | Admitting: Physician Assistant

## 2024-02-29 DIAGNOSIS — M25552 Pain in left hip: Secondary | ICD-10-CM | POA: Diagnosis not present

## 2024-02-29 DIAGNOSIS — S76012A Strain of muscle, fascia and tendon of left hip, initial encounter: Secondary | ICD-10-CM | POA: Diagnosis not present

## 2024-03-03 DIAGNOSIS — N3281 Overactive bladder: Secondary | ICD-10-CM | POA: Diagnosis not present

## 2024-03-03 DIAGNOSIS — M6289 Other specified disorders of muscle: Secondary | ICD-10-CM | POA: Diagnosis not present

## 2024-03-14 DIAGNOSIS — M25552 Pain in left hip: Secondary | ICD-10-CM | POA: Diagnosis not present

## 2024-03-17 DIAGNOSIS — M6289 Other specified disorders of muscle: Secondary | ICD-10-CM | POA: Diagnosis not present

## 2024-03-17 DIAGNOSIS — N3281 Overactive bladder: Secondary | ICD-10-CM | POA: Diagnosis not present

## 2024-03-24 DIAGNOSIS — R350 Frequency of micturition: Secondary | ICD-10-CM | POA: Diagnosis not present

## 2024-03-24 DIAGNOSIS — M6289 Other specified disorders of muscle: Secondary | ICD-10-CM | POA: Diagnosis not present

## 2024-03-26 ENCOUNTER — Encounter: Payer: Self-pay | Admitting: Internal Medicine

## 2024-03-26 ENCOUNTER — Ambulatory Visit: Admitting: Internal Medicine

## 2024-03-26 VITALS — BP 118/82 | HR 73 | Temp 98.1°F | Resp 18 | Ht 74.0 in | Wt 156.2 lb

## 2024-03-26 DIAGNOSIS — F429 Obsessive-compulsive disorder, unspecified: Secondary | ICD-10-CM | POA: Diagnosis not present

## 2024-03-26 DIAGNOSIS — S73192A Other sprain of left hip, initial encounter: Secondary | ICD-10-CM | POA: Insufficient documentation

## 2024-03-26 DIAGNOSIS — S73192D Other sprain of left hip, subsequent encounter: Secondary | ICD-10-CM | POA: Diagnosis not present

## 2024-03-26 NOTE — Assessment & Plan Note (Signed)
 He is seeing ortho for this and is getting PT every 2 weeks.  He will followup with ortho.

## 2024-03-26 NOTE — Assessment & Plan Note (Signed)
 I filled out his paperwork for his disability.  He has OCD with new diagnosis of ADHD.

## 2024-03-26 NOTE — Progress Notes (Signed)
 Office Visit  Subjective   Patient ID: Ryan [name]   DOB: 02-18-1961   Age: 63 y.o.   MRN: [id number]   Chief Complaint Chief Complaint  Patient presents with   Paperwork    Paperwork for insurance, proof of disability     History of Present Illness The patient returns for followup of his depression and anxiety as well as OCD.   Today, he states his depression is mild and his anxiety is moderate.  I started him on viibryd 20mg  daily and referred him to the nutritionist as he was losinig weight.  He tells me the nutritionist counselled him and he was not taking enough caloric intake.  Our plan was to see him back after starting viibryd and if his anxiety was controlled and he continued to have weight loss, we would possibly refer to GI.  However, he gained weight and this was not a problem.  He was on viibryd but stopped this and he is currently just on supplments.  He denies any panic attacks.  He has a history of anxiety/OCD/Depression.  However, he states he is now having more OCD patterns and is requesting to go back on fluvoxamine .  He was placed on this for his GAD/OCD in early summer of 2022.  He was seeing a psychiatrist in [place] where they started him on Remeron.  He no longer goes to this psychiatrist and is looking going back to [place].   He is currently on disability due to his OCD where he has been on disability since 2008. He was on fluvoxamine  (Luvox ) for about 15 years. He was followed by [facility] where he is seeing a Haematologist and psychiatrist in [place] at this time. He denies helpless feeling and suicidal ideation. This patient feels that she is able to care for himself. He currently lives alone. He has a history of being admitted to a psychiatric hospital in 1990's. There was no suicidal ideation at that time.  He also has recently found out he has ADHD.  He is currently seeing a therapist at [facility] in [place] with Ryan [name].  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  This was found years ago by Dr. Abbie Hock in orthopedics.  He has a history of reactive arthritis with Reiter's Syndrome.  Dr. Abbie Hock told him at that time he had Reiter's Syndrome but today he denies any problems with this.    I did see Ryan [name] last month where he presented with left hip pain.  Again, he did ss  urgent care for this as well.  He was at work and was unloading a truck when he jerked a tree limb backwards and then had immediately pain over his left lateral hip pain.  He states he turned abruptly and twisted and had immediate pain in his left hip area.  He has been ambulatory since then but certain motions cause a sharp stabbing pain.  Patient is concerned that he tore his labrum in his hip.  Does not have any bruising or overlying skin changes.  He had a steriod injection of his left hip.  We referred him to ortho who did a MRI of his left hip on 02/29/2024.  This demonstrated no hip fracture, dislocation or avascular necrosis.  There is a partial-thickness tear of the periphery of the  left gluteus minimus and medius muscles with surrounding muscle edema. There is a small partial-thickness tear of the right gluteus medius tendon insertion.  There was mild-moderate partial-thickness cartilage loss of the femoral head and acetabulum bilaterally.  There was a left anterosuperior labral tear.  The patient has sent him for physical therapy at this time.      Past Medical History Past Medical History:  Diagnosis Date   Anemia    hx   Anxiety    Arthritis    "joints of fingers; ankles" (04/29/2018)   BPH (benign prostatic hyperplasia)    Bulging lumbar disc dx'd 06/2016   Cellulitis of leg, right 04/28/2018   Chronic sinusitis    Daily headache    "daily lately; at least weekly" (04/29/2018)   Family history of breast cancer  01/16/2022   Family history of gastric cancer 01/16/2022   GERD (gastroesophageal reflux disease)    Hepatitis C ~ 2000   "turned yellow; jaundiced; not sure which kind of hepatitis" (04/29/2018)   IBS (irritable bowel syndrome)    Left lumbar radiculopathy    OCD (obsessive compulsive disorder)    OCD (obsessive compulsive disorder)    Overactive bladder    Reiter's arthritis involving lower leg, unspecified laterality (HCC)    Reiter's syndrome    Sacrococcygeal pain    Urethral stricture    Urge incontinence      Allergies Allergies  Allergen Reactions   Bee Pollen Other (See Comments)   Pollen Extract Other (See Comments)   Short Ragweed Pollen Ext Other (See Comments)    sneezing sneezing      Medications  Current Outpatient Medications:    acetaminophen  (TYLENOL ) 500 MG tablet, Take 1,000 mg by mouth every 4 (four) hours as needed., Disp: , Rfl:    ASHWAGANDHA PO, Take 1 tablet by mouth daily., Disp: , Rfl:    co-enzyme Q-10 30 MG capsule, Take 30 mg by mouth daily., Disp: , Rfl:    fluticasone (FLONASE) 50 MCG/ACT nasal spray, SHAKE LIQUID AND USE 1 SPRAY(50 MCG) IN EACH NOSTRIL TWICE DAILY, Disp: 48 g, Rfl: 3   levocetirizine (XYZAL) 5 MG tablet, Take 5 mg by mouth daily., Disp: , Rfl:    montelukast  (SINGULAIR ) 10 MG tablet, Take 1 tablet (10 mg total) by mouth at bedtime., Disp: 30 tablet, Rfl: 2   Multiple Vitamin (MULTIVITAMIN WITH MINERALS) TABS tablet, Take 1 tablet by mouth daily., Disp: , Rfl:    Probiotic Product (CULTURELLE PROBIOTICS PO), Take 1 tablet by mouth daily., Disp: , Rfl:    Red Yeast Rice Extract (RED YEAST RICE PO), Take 1 tablet by mouth daily., Disp: , Rfl:    Turmeric (QC TUMERIC COMPLEX) 500 MG CAPS, Take 1 tablet by mouth daily., Disp: , Rfl:    Review of Systems Review of Systems  Constitutional:  Negative for chills and fever.  Respiratory:  Negative for shortness of breath.   Cardiovascular:  Negative for chest pain, palpitations and leg  swelling.  Gastrointestinal:  Negative for abdominal pain, constipation, diarrhea, nausea and vomiting.  Musculoskeletal:  Positive for back pain. Negative for neck pain.       Hip pain  Neurological:  Negative for dizziness, weakness and headaches.       Objective:    Vitals BP 118/82 (BP Location: Left Arm, Patient Position: Sitting)   Pulse 73   Temp 98.1 F (36.7 C)   Resp 18   Ht 6\' 2"  (1.88 m)   Wt 156  lb 4 oz (70.9 kg)   SpO2 96%   BMI 20.06 kg/m    Physical Examination Physical Exam Constitutional:      Appearance: Normal appearance. He is not ill-appearing.  Cardiovascular:     Rate and Rhythm: Normal rate and regular rhythm.     Pulses: Normal pulses.     Heart sounds: No murmur heard.    No friction rub. No gallop.  Pulmonary:     Effort: Pulmonary effort is normal. No respiratory distress.     Breath sounds: No wheezing, rhonchi or rales.  Abdominal:     General: Abdomen is flat. Bowel sounds are normal. There is no distension.     Palpations: Abdomen is soft.     Tenderness: There is no abdominal tenderness.  Musculoskeletal:     Right lower leg: No edema.     Left lower leg: No edema.  Skin:    General: Skin is warm and dry.     Findings: No rash.  Neurological:     Mental Status: He is alert.        Assessment & Plan:   Tear of left acetabular labrum He is seeing ortho for this and is getting PT every 2 weeks.  He will followup with ortho.  OCD (obsessive compulsive disorder) I filled out his paperwork for his disability.  He has OCD with new diagnosis of ADHD.    No follow-ups on file.   Wayne Haines, MD

## 2024-04-03 DIAGNOSIS — M6289 Other specified disorders of muscle: Secondary | ICD-10-CM | POA: Diagnosis not present

## 2024-04-03 DIAGNOSIS — N3281 Overactive bladder: Secondary | ICD-10-CM | POA: Diagnosis not present

## 2024-04-16 DIAGNOSIS — M25552 Pain in left hip: Secondary | ICD-10-CM | POA: Diagnosis not present

## 2024-04-17 DIAGNOSIS — Z713 Dietary counseling and surveillance: Secondary | ICD-10-CM | POA: Diagnosis not present

## 2024-04-17 DIAGNOSIS — R634 Abnormal weight loss: Secondary | ICD-10-CM | POA: Diagnosis not present

## 2024-04-21 DIAGNOSIS — M47814 Spondylosis without myelopathy or radiculopathy, thoracic region: Secondary | ICD-10-CM | POA: Diagnosis not present

## 2024-04-23 DIAGNOSIS — M19041 Primary osteoarthritis, right hand: Secondary | ICD-10-CM | POA: Diagnosis not present

## 2024-04-23 DIAGNOSIS — M65341 Trigger finger, right ring finger: Secondary | ICD-10-CM | POA: Diagnosis not present

## 2024-04-23 DIAGNOSIS — M25531 Pain in right wrist: Secondary | ICD-10-CM | POA: Diagnosis not present

## 2024-04-29 NOTE — Telephone Encounter (Signed)
 Left VM for pt to reschedule 7/22 appt per provider schedule change. SDJ

## 2024-05-02 DIAGNOSIS — M546 Pain in thoracic spine: Secondary | ICD-10-CM | POA: Diagnosis not present

## 2024-05-02 DIAGNOSIS — M25552 Pain in left hip: Secondary | ICD-10-CM | POA: Diagnosis not present

## 2024-05-06 DIAGNOSIS — Z5181 Encounter for therapeutic drug level monitoring: Secondary | ICD-10-CM | POA: Diagnosis not present

## 2024-05-09 ENCOUNTER — Encounter: Payer: Self-pay | Admitting: Internal Medicine

## 2024-05-09 ENCOUNTER — Ambulatory Visit: Payer: 59 | Admitting: Internal Medicine

## 2024-05-09 VITALS — BP 116/62 | HR 82 | Temp 98.1°F | Resp 18 | Ht 74.0 in | Wt 155.0 lb

## 2024-05-09 DIAGNOSIS — F411 Generalized anxiety disorder: Secondary | ICD-10-CM

## 2024-05-09 DIAGNOSIS — F429 Obsessive-compulsive disorder, unspecified: Secondary | ICD-10-CM

## 2024-05-09 DIAGNOSIS — E7800 Pure hypercholesterolemia, unspecified: Secondary | ICD-10-CM | POA: Diagnosis not present

## 2024-05-09 MED ORDER — MELOXICAM 7.5 MG PO TBDP: 1.0000 | ORAL_TABLET | Freq: Every day | ORAL | Status: DC | PRN

## 2024-05-09 NOTE — Assessment & Plan Note (Signed)
 Plan as above.

## 2024-05-09 NOTE — Progress Notes (Signed)
 Office Visit  Subjective   Patient ID: Ryan [name]   DOB: Apr 25, 1961   Age: 63 y.o.   MRN: [id number]   Chief Complaint No chief complaint on file.    History of Present Illness The patient returns for followup of his depression and anxiety as well as OCD.  Today, he has appointment with Belvie Calin who is a therapist.  He was told by that group that he does ADHD but they have not started him on any medications.  He is seeing a psychiatrist in [place] who is putting him thru a program called [facility].  He is currently not on any medications at this time.  He states he an option for cognitive behavioral therapy when he spoke to his provider last time.  Today, he states his depression is mild and his anxiety is moderate. He was on viibryd but stopped this and he is currently just on supplments.  He denies any panic attacks.  He has a history of anxiety/OCD/Depression.  However, he states he is now having more OCD patterns and is requesting to go back on fluvoxamine .  He was placed on this for his GAD/OCD in early summer of 2022.  He was seeing a psychiatrist in [place] where they started him on Remeron.  He no longer goes to this psychiatrist and is looking going back to [place].   He is currently on disability due to his OCD where he has been on disability since 2008. He was on fluvoxamine  (Luvox ) for about 15 years (again he is not on any medications but he does take supplements). He was followed by [facility] where he is seeing a Haematologist and psychiatrist in [place] at this time. He denies helpless feeling and suicidal ideation. This patient feels that she is able to care for himself. He currently lives alone. He has a history of being admitted to a psychiatric hospital in 1990's. There was no suicidal ideation at that time.   Ryan [name] returns today for routine followup on his cholesterol.  On his yearly exam in 01/2022, his cholesterol was elevated and we discovered his ASCVD score was  10.8% at that time.  I aske him to start pravastatin and he states that he took it for 5 days and then stopped it due to it causing myalgias in his right arm.   He states he is on a herbal supplement taking both red yeast rice and Fenugreek.  He is also drinking beet juice.  We checked his FLP in 2018 and his ASCVD score was 3.3% at that time. Overall, he states he is doing well and is without any complaints or problems at this time. He specifically denies abdominal pain, nausea, vomiting, diarrhea, myalgias, and fatigue. He remains on dietary management and supplements as described as well as red yeast rice. He is fasting today.     Past Medical History Past Medical History:  Diagnosis Date   Anemia    hx   Anxiety    Arthritis    joints of fingers; ankles (04/29/2018)   BPH (benign prostatic hyperplasia)    Bulging lumbar disc dx'd 06/2016   Cellulitis of leg, right 04/28/2018   Chronic sinusitis    Daily headache    daily lately; at least weekly (04/29/2018)   Family history of breast cancer 01/16/2022   Family history of gastric cancer 01/16/2022   GERD (gastroesophageal reflux disease)    Hepatitis C ~ 2000   turned yellow; jaundiced; not sure which  kind of hepatitis (04/29/2018)   IBS (irritable bowel syndrome)    Left lumbar radiculopathy    OCD (obsessive compulsive disorder)    OCD (obsessive compulsive disorder)    Overactive bladder    Reiter's arthritis involving lower leg, unspecified laterality (HCC)    Reiter's syndrome    Sacrococcygeal pain    Urethral stricture    Urge incontinence      Allergies Allergies  Allergen Reactions   Bee Pollen Other (See Comments)   Pollen Extract Other (See Comments)   Short Ragweed Pollen Ext Other (See Comments)    sneezing sneezing      Medications  Current Outpatient Medications:    acetaminophen  (TYLENOL ) 500 MG tablet, Take 1,000 mg by mouth every 4 (four) hours as needed., Disp: , Rfl:    ASHWAGANDHA PO, Take 1  tablet by mouth daily., Disp: , Rfl:    co-enzyme Q-10 30 MG capsule, Take 30 mg by mouth daily., Disp: , Rfl:    fluticasone (FLONASE) 50 MCG/ACT nasal spray, SHAKE LIQUID AND USE 1 SPRAY(50 MCG) IN EACH NOSTRIL TWICE DAILY, Disp: 48 g, Rfl: 3   levocetirizine (XYZAL) 5 MG tablet, Take 5 mg by mouth daily., Disp: , Rfl:    Multiple Vitamin (MULTIVITAMIN WITH MINERALS) TABS tablet, Take 1 tablet by mouth daily., Disp: , Rfl:    Probiotic Product (CULTURELLE PROBIOTICS PO), Take 1 tablet by mouth daily., Disp: , Rfl:    Red Yeast Rice Extract (RED YEAST RICE PO), Take 1 tablet by mouth daily., Disp: , Rfl:    Turmeric (QC TUMERIC COMPLEX) 500 MG CAPS, Take 1 tablet by mouth daily., Disp: , Rfl:    Review of Systems Review of Systems  Constitutional:  Negative for chills and fever.  Eyes:  Negative for blurred vision and double vision.  Respiratory:  Negative for shortness of breath.   Cardiovascular:  Negative for chest pain and palpitations.  Gastrointestinal:  Negative for constipation, diarrhea, nausea and vomiting.  Musculoskeletal:  Negative for myalgias.  Neurological:  Positive for headaches. Negative for dizziness and weakness.       Objective:    Vitals BP 116/62   Pulse 82   Temp 98.1 F (36.7 C)   Resp 18   Ht 6' 2 (1.88 m)   Wt 155 lb (70.3 kg)   SpO2 98%   BMI 19.90 kg/m    Physical Examination Physical Exam Constitutional:      Appearance: Normal appearance. He is not ill-appearing.   Cardiovascular:     Rate and Rhythm: Normal rate and regular rhythm.     Pulses: Normal pulses.     Heart sounds: No murmur heard.    No friction rub. No gallop.  Pulmonary:     Effort: Pulmonary effort is normal. No respiratory distress.     Breath sounds: No wheezing, rhonchi or rales.  Abdominal:     General: Abdomen is flat. Bowel sounds are normal. There is no distension.     Palpations: Abdomen is soft.     Tenderness: There is no abdominal tenderness.    Musculoskeletal:     Right lower leg: No edema.     Left lower leg: No edema.   Skin:    General: Skin is warm and dry.     Findings: No rash.   Neurological:     Mental Status: He is alert.        Assessment & Plan:   GAD (generalized anxiety disorder) He is seeing  psychiatry and counselling with [facility].  He is going to see them today.  He is taking supplmenets.  They will also discuss his ADHD.  OCD (obsessive compulsive disorder) Plan as above.  Hypercholesterolemia We discussed his weight at this time.  He has been about the same with his weight and he is exercising more due to his arthritic problems.  We will check his cholesterol at his yearly exam.    Return in about 3 months (around 08/09/2024).   Selinda Fleeta Finger, MD

## 2024-05-09 NOTE — Assessment & Plan Note (Signed)
 He is seeing psychiatry and counselling with [facility].  He is going to see them today.  He is taking supplmenets.  They will also discuss his ADHD.

## 2024-05-09 NOTE — Assessment & Plan Note (Signed)
 We discussed his weight at this time.  He has been about the same with his weight and he is exercising more due to his arthritic problems.  We will check his cholesterol at his yearly exam.

## 2024-05-15 DIAGNOSIS — M25552 Pain in left hip: Secondary | ICD-10-CM | POA: Diagnosis not present

## 2024-05-15 DIAGNOSIS — M546 Pain in thoracic spine: Secondary | ICD-10-CM | POA: Diagnosis not present

## 2024-05-19 DIAGNOSIS — R350 Frequency of micturition: Secondary | ICD-10-CM | POA: Diagnosis not present

## 2024-05-19 NOTE — Progress Notes (Signed)
 Ryan Nam, MD [facility] Urology [address] [place], [place]  Assessment:  UUI   Plan: Screening PSA Rx Gemtesa PTNS weekly x 12 RTC 3 months  Referring Physician:  No referring provider defined for this encounter.  PCP:  Selinda [name], MD  Chief Complaint  Patient presents with  . Urinary Frequency    HPI:  63 y.o. male seen in consultation at the request of No referring provider defined for this encounter.  The patient has had concerns including Urinary Frequency.  63 year old man with a longstanding urology history.  I reviewed Dr. [name] notes which are copied below and are significant.  He has seen numerous providers at [facility] as well as [facility] prior to see me for the first time today.  He has seen Dr. [name] who performed a greenlight laser vaporization of the prostate in 2015.  He then subsequently saw Dr. [name] in 2020 and was found to have a widemouth bulbar urethral stricture.  At this time he underwent urethral dilation as well as cystoscopy with Botox injection.  He then subsequently saw Dr. [name] who believed his issue to be largely pelvic floor dysfunction.  He was concomitantly evaluated for interstitial cystitis which it was not felt that he had.  He underwent cystoscopy with hydrodistention showing a bladder capacity of 1200 cc, no glomerulations, no Hunner's lesions.  He did notice a small amount of regrowth of prostatic tissue however nothing that would be significantly obstructive.  He similarly underwent urodynamic testing and per Dr. Sar note was felt to be largely a functional and not obstructive issue related to high tone pelvic floor.  During the last encounter per the documentation the patient threatened a lawsuit against Dr. Sar and it was noted that he was not to return to the practice.  UA: Negative  PSA: 0.7  Patient underwent Cystoscopy with me in 12/2023 with the following findings:    Findings: Anterior urethra: normal without strictures and without scarring.  Prostatic urethra: Prior TUR procedure defect noted without evidence of bladder neck contracture Bladder: Mild trabeculation, without lesions.  03/24/24:  Patient presents for follow-up today.  Patient states that pelvic floor physical therapy is helping him at this time.  He does have a great deal of stress in his life surrounding his aging mother.  He states he does still suffer from OAB like symptoms.  He has tried both anticholinergic and beta 3 agents in the past.  He has also had cystoscopy with Botox before which he states marginally helped him.    Patient returns to see me as Dr. Valdemar has left the practice.  He is interested in PTNS.  Also interested in [place] for OAB/UUI.  He would like to schedule PTNS which has been helpful previously on 2 occasions.   No orders to display    LAB RESULTS REVIEWED: PSA levels: No results found for: PSA  Serum testosterone  levels: No results found for: TESTOSTERONE   Urine culture: No results found for: LABURIN  BMP: No results found for: NA, K, CALCIUM, CL, GLU, CO2, BUN, CREATININE, PROT, ALBUMIN, BILITOT  CBC: No results found for: WBC, RBC, HGB, HCT, PLT   PMH:  Medical History[1]   PSH: Surgical History[2]     Medications: Current Medications[3]  Allergies: Bee pollen, Weed pollen-short ragweed, and Ragweed pollen   Social History: Patient  reports that he quit smoking about 13 years ago. His smoking use included cigarettes. He has never  used smokeless tobacco. He reports current alcohol  use of about 29.2 standard drinks of alcohol  per week. He reports that he does not use drugs.   Family History: The patient's family history includes Alzheimer's disease in his father and maternal grandmother; Arrhythmia in his mother; Breast cancer in his maternal grandmother; Cancer in his maternal grandfather;  Dementia in his maternal grandmother; Headaches in his brother, brother, father, maternal grandfather, maternal grandmother, mother, paternal grandfather, and paternal grandmother; Heart attack in his paternal grandfather and paternal grandmother; Neuropathy in his mother; Stroke in his father and maternal grandmother.   Review of Systems: General: No fever, chills, weight changes Skin: No rashes, lesions, wounds Eyes: no discharge, redness, pain HENT: no ear pain, hearing loss, drainage, tinnitus Endocrine: no heat/cold intolerance, no polyuria Respiratory: No cough, wheezes, shortness of breath Cardiovascular: No palpitations, chest pain GI: No nausea, vomiting, diarrhea, constipation GU: No dysuria, increased frequency CNS: No numbness, dizziness, headache Musculoskeletal: No back pain, joint pain Blood/lymphatics: No easy bruising, bleeding Mood/affect: No anxiety/depression  Otherwise full 14 point review of systems performed by me is negative unless mentioned in the HPI    Physical Exam: BP 135/87   Pulse 71   Temp 98 F (36.7 C) (Temporal)   Resp 14   Ht 1.854 m (6' 1)   Wt 71.2 kg (157 lb)   BMI 20.71 kg/m  GENERAL APPEARANCE:  Well appearing, well developed, well nourished, NAD HEENT: Atraumatic, Normocephalic NECK: Supple without lymphadenopathy or masses. LUNGS: Clear to auscultation bilaterally, Normal  respiratory effort HEART: no obvious cyanosis ABDOMEN: Soft, non-tender, No Masses. EXTREMITIES: Moves all extremities well.  Without edema. NEUROLOGIC:  Alert and oriented x 3, CN II-XII grossly intact.  MENTAL STATUS:  Appropriate. BACK:  Non-tender to palpation.  No CVAT SKIN:  Warm, dry and intact.   GENT: Peis, scrotum and  testicles are normal.  Prostate 40 g smooth benign  Results:   Urinalysis and laboratory results, if present, reviewed and analyzed. Recent Results (from the past 24 hours)  Urinalysis with Reflex to Microscopic   Collection Time:  05/19/24 10:21 AM  Result Value Ref Range   Color, Urine Yellow Yellow   Clarity, Urine Clear Clear   Specific Gravity, Urine 1.010 1.002 - 1.030   pH, Urine 5.5 5.0 - 8.0   Protein, Urine Negative Negative, Trace mg/dL   Glucose, Urine Negative Negative mg/dL   Ketones, Urine Negative Negative mg/dL   Bilirubin, Urine Negative Negative   Blood, Urine Negative Negative   Nitrite, Urine Negative Negative   Leukocyte Esterase, Urine Trace (A) Negative   Urobilinogen, Urine 0.2 <2.0 mg/dL  Urinalysis, Microscopic Only   Collection Time: 05/19/24 10:21 AM  Result Value Ref Range   WBC, Urine 0-2 <3 /HPF   RBC, Urine None Seen <3 /HPF   Bacteria, Urine None Seen None Seen /HPF            [1] Past Medical History: Diagnosis Date  . Alopecia   . Bladder incontinence   . Bladder neurogenesis   . Cellulitis 05/27/2018   from a spider bite  . Congestion of nasal sinus 01/06/2014  . DDD (degenerative disc disease), thoracolumbar   . Depression   . Dislocation of L2-L3 lumbar vertebra   . GERD (gastroesophageal reflux disease)   . History of BPH   . OAB (overactive bladder)   . OCD (obsessive compulsive disorder)   . Reiter's syndrome    (CMD)   . Urethral stricture   .  Urinary tract infection   [2] Past Surgical History: Procedure Laterality Date  . BOTOX THERAPY N/A 08/19/2019   Procedure: BOTOX INJECTION;  Surgeon: Bernardino Belvie Gathers, MD;  Location: [facility] OUTPATIENT OR;  Service: Urology;  Laterality: N/A;  . CYSTOSCOPY     Procedure: CYSTOSCOPY  . CYSTOSCOPY N/A 08/19/2019   Procedure: CYSTOSCOPY;  Surgeon: Bernardino Belvie Gathers, MD;  Location: [facility] OUTPATIENT OR;  Service: Urology;  Laterality: N/A;  . CYSTOSCOPY  06/28/2020   Procedure: CYSTOSCOPY;  Surgeon: Lamar Lynwood Sar, MD;  Location: [facility] MAIN OR;  Service: Urology;;  . PHYLLIS W/ DILATION OF BLADDER N/A 12/19/2019   Procedure: CYSTOSCOPY W/ HYDRODISTENTION;  Surgeon: Lamar Lynwood Sar, MD;  Location: [facility]  MINOR PROC;  Service: Urology;  Laterality: N/A;  . CYSTOSTOMY W/ BLADDER BIOPSY  06/28/2020   Procedure: BLADDER BIOPSY;  Surgeon: Lamar Lynwood Sar, MD;  Location: [facility] MAIN OR;  Service: Urology;;  . HERNIA REPAIR  2009   Procedure: HERNIA REPAIR  . LASER TREATMENT N/A 01/06/2014   Procedure: LASER VAPORIZATION OF PROSTATE;  Surgeon: Sula Latch, MD;  Location: [facility] MAIN OR;  Service: Urology;  Laterality: N/A;  . LASER VAPORIZATION OF PROSTATE WITH THULIUM  06/28/2020   Procedure: LASER VAPORIZATION OF PROSTATE WITH THULIUM;  Surgeon: Lamar Lynwood Sar, MD;  Location: [facility] MAIN OR;  Service: Urology;;  . LEG SURGERY N/A    Procedure: LEG SURGERY  . OTHER SURGICAL HISTORY     Procedure: OTHER SURGICAL HISTORY (tumi)  . PROSTATE SURGERY  2010 , 2013   Procedure: PROSTATE SURGERY  . RHINOPLASTY  06/2013   Procedure: RHINOPLASTY  . URETHRAL DILATION N/A 08/19/2019   Procedure: URETHRAL DILATION;  Surgeon: Bernardino Belvie Gathers, MD;  Location: [facility] OUTPATIENT OR;  Service: Urology;  Laterality: N/A;  [3] Current Outpatient Medications  Medication Sig Dispense Refill  . acetaminophen  (TYLENOL ) 500 mg tablet Take 1,000 mg by mouth every 6 (six) hours as needed.    . ASHWAGANDHA EXTRACT ORAL Take 1 tablet by mouth daily.    . Bacillus coagulans (PROBIOTIC, B. COAGULANS, ORAL) Take 1 tablet by mouth daily.    . biotin 1 mg capsule Take by mouth.    SABRA CALCIUM FRUCTOBORATE ORAL Take 1 tablet by mouth every morning before breakfast.    . co-enzyme Q-10 30 mg capsule Take 30 mg by mouth daily.    . fluticasone propionate (FLONASE) 50 mcg/spray nasal spray 1 spray 2 (two) times a day.    . levocetirizine (XYZAL) 5 mg tablet Take 5 mg by mouth daily.    . multivitamin (THERAGRAN) tab tablet Take 1 tablet by mouth Once Daily.    . RED YEAST RICE ORAL Take 1 tablet by mouth daily.    . sodium chloride  (Saline NasaL) 0.65 % nasal spray Administer 1 spray into affected nostril(s) as needed  (congestion).    . TURMERIC ORAL Take 1 tablet by mouth daily.    SABRA vibegron (Gemtesa) 75 mg tab Take 1 tablet (75 mg total) by mouth daily. 30 tablet 11   No current facility-administered medications for this visit.

## 2024-06-04 DIAGNOSIS — R350 Frequency of micturition: Secondary | ICD-10-CM | POA: Diagnosis not present

## 2024-06-11 DIAGNOSIS — L209 Atopic dermatitis, unspecified: Secondary | ICD-10-CM | POA: Diagnosis not present

## 2024-06-11 DIAGNOSIS — L300 Nummular dermatitis: Secondary | ICD-10-CM | POA: Diagnosis not present

## 2024-06-12 ENCOUNTER — Other Ambulatory Visit: Payer: Self-pay | Admitting: Internal Medicine

## 2024-06-15 ENCOUNTER — Other Ambulatory Visit: Payer: Self-pay

## 2024-06-15 ENCOUNTER — Emergency Department (HOSPITAL_BASED_OUTPATIENT_CLINIC_OR_DEPARTMENT_OTHER)

## 2024-06-15 ENCOUNTER — Emergency Department (HOSPITAL_BASED_OUTPATIENT_CLINIC_OR_DEPARTMENT_OTHER)
Admission: EM | Admit: 2024-06-15 | Discharge: 2024-06-15 | Disposition: A | Discharge status: Home / Self Care | Attending: Emergency Medicine | Admitting: Emergency Medicine

## 2024-06-15 ENCOUNTER — Encounter (HOSPITAL_BASED_OUTPATIENT_CLINIC_OR_DEPARTMENT_OTHER): Payer: Self-pay | Admitting: Emergency Medicine

## 2024-06-15 DIAGNOSIS — R059 Cough, unspecified: Secondary | ICD-10-CM | POA: Diagnosis not present

## 2024-06-15 DIAGNOSIS — R0981 Nasal congestion: Secondary | ICD-10-CM | POA: Insufficient documentation

## 2024-06-15 DIAGNOSIS — N410 Acute prostatitis: Secondary | ICD-10-CM | POA: Diagnosis not present

## 2024-06-15 DIAGNOSIS — R509 Fever, unspecified: Secondary | ICD-10-CM | POA: Diagnosis not present

## 2024-06-15 DIAGNOSIS — R300 Dysuria: Secondary | ICD-10-CM | POA: Diagnosis present

## 2024-06-15 LAB — URINALYSIS, ROUTINE W REFLEX MICROSCOPIC
Bilirubin Urine: NEGATIVE
Glucose, UA: NEGATIVE mg/dL
Hgb urine dipstick: NEGATIVE
Ketones, ur: NEGATIVE mg/dL
Nitrite: NEGATIVE
Protein, ur: NEGATIVE mg/dL
Specific Gravity, Urine: 1.015 (ref 1.005–1.030)
pH: 7 (ref 5.0–8.0)

## 2024-06-15 LAB — COMPREHENSIVE METABOLIC PANEL WITH GFR
ALT: 19 U/L (ref 0–44)
AST: 25 U/L (ref 15–41)
Albumin: 4.5 g/dL (ref 3.5–5.0)
Alkaline Phosphatase: 51 U/L (ref 38–126)
Anion gap: 11 (ref 5–15)
BUN: 16 mg/dL (ref 8–23)
CO2: 26 mmol/L (ref 22–32)
Calcium: 9.3 mg/dL (ref 8.9–10.3)
Chloride: 101 mmol/L (ref 98–111)
Creatinine, Ser: 0.89 mg/dL (ref 0.61–1.24)
GFR, Estimated: >60 mL/min (ref >60)
Glucose, Bld: 103 mg/dL — ABNORMAL HIGH (ref 70–99)
Potassium: 4.2 mmol/L (ref 3.5–5.1)
Sodium: 139 mmol/L (ref 135–145)
Total Bilirubin: 0.4 mg/dL (ref 0.0–1.2)
Total Protein: 7 g/dL (ref 6.5–8.1)

## 2024-06-15 LAB — URINALYSIS, MICROSCOPIC (REFLEX)

## 2024-06-15 LAB — CBC WITH DIFFERENTIAL/PLATELET
Abs Immature Granulocytes: 0.01 K/uL (ref 0.00–0.07)
Basophils Absolute: 0 K/uL (ref 0.0–0.1)
Basophils Relative: 1 %
Eosinophils Absolute: 0 K/uL (ref 0.0–0.5)
Eosinophils Relative: 1 %
HCT: 40.2 % (ref 39.0–52.0)
Hemoglobin: 13.6 g/dL (ref 13.0–17.0)
Immature Granulocytes: 0 %
Lymphocytes Relative: 32 %
Lymphs Abs: 1.3 K/uL (ref 0.7–4.0)
MCH: 31.3 pg (ref 26.0–34.0)
MCHC: 33.8 g/dL (ref 30.0–36.0)
MCV: 92.4 fL (ref 80.0–100.0)
Monocytes Absolute: 0.3 K/uL (ref 0.1–1.0)
Monocytes Relative: 8 %
Neutro Abs: 2.3 K/uL (ref 1.7–7.7)
Neutrophils Relative %: 58 %
Platelets: 233 K/uL (ref 150–400)
RBC: 4.35 MIL/uL (ref 4.22–5.81)
RDW: 11.6 % (ref 11.5–15.5)
WBC: 4 K/uL (ref 4.0–10.5)
nRBC: 0 % (ref 0.0–0.2)

## 2024-06-15 LAB — RESP PANEL BY RT-PCR (RSV, FLU A&B, COVID)  RVPGX2
Influenza A by PCR: NEGATIVE
Influenza B by PCR: NEGATIVE
Resp Syncytial Virus by PCR: NEGATIVE
SARS Coronavirus 2 by RT PCR: NEGATIVE

## 2024-06-15 LAB — LIPASE, BLOOD: Lipase: 18 U/L (ref 11–51)

## 2024-06-15 MED ORDER — SULFAMETHOXAZOLE-TRIMETHOPRIM 800-160 MG PO TABS
1.0000 | ORAL_TABLET | Freq: Once | ORAL | Status: AC
  Administered 2024-06-15: 1 via ORAL
  Filled 2024-06-15: qty 1

## 2024-06-15 MED ORDER — SULFAMETHOXAZOLE-TRIMETHOPRIM 800-160 MG PO TABS: 1.0000 | ORAL_TABLET | Freq: Two times a day (BID) | ORAL | 0 refills | Status: AC

## 2024-06-15 MED ORDER — LORAZEPAM 1 MG PO TABS
1.0000 mg | ORAL_TABLET | Freq: Once | ORAL | Status: AC
  Administered 2024-06-15: 1 mg via ORAL
  Filled 2024-06-15: qty 1

## 2024-06-15 MED ORDER — IOHEXOL 300 MG/ML  SOLN: 100.0000 mL | Freq: Once | INTRAMUSCULAR | Status: DC | PRN

## 2024-06-15 NOTE — ED Triage Notes (Signed)
 Pt c/o chills and dysuria x 2d; noticed urine was pink, but now clear

## 2024-06-15 NOTE — ED Provider Notes (Signed)
 [place] [facility] AT [facility] Provider Note   CSN: [id number] Arrival date & time: 06/15/24  1714     Patient presents with: Dysuria   Ryan [name] is a 63 y.o. male history of anxiety, OCD, overactive bladder presents with complaints of dysuria increased frequency over the past few days.  Endorses subjective fevers and chills.  Has had some achiness in his hips perineal region.  No significant flank pain.  Does endorse some nasal congestion, minimal cough.       Dysuria Presenting symptoms: dysuria    Past Medical History:  Diagnosis Date   Anemia    hx   Anxiety    Arthritis    joints of fingers; ankles (04/29/2018)   BPH (benign prostatic hyperplasia)    Bulging lumbar disc dx'd 06/2016   Cellulitis of leg, right 04/28/2018   Chronic sinusitis    Daily headache    daily lately; at least weekly (04/29/2018)   Family history of breast cancer 01/16/2022   Family history of gastric cancer 01/16/2022   GERD (gastroesophageal reflux disease)    Hepatitis C ~ 2000   turned yellow; jaundiced; not sure which kind of hepatitis (04/29/2018)   IBS (irritable bowel syndrome)    Left lumbar radiculopathy    OCD (obsessive compulsive disorder)    OCD (obsessive compulsive disorder)    Overactive bladder    Reiter's arthritis involving lower leg, unspecified laterality (HCC)    Reiter's syndrome    Sacrococcygeal pain    Urethral stricture    Urge incontinence    Past Surgical History:  Procedure Laterality Date   CLOSED REDUCTION TOE FRACTURE Left 05/2014   titanium plate in 2nd digit   FRACTURE SURGERY     INGUINAL HERNIA REPAIR Left 06/2008   NASAL SEPTUM SURGERY  2016   PROSTATE SURGERY  2015   transurethral laser-induced prostatectomy   RHINOPLASTY  2018   TONSILLECTOMY AND ADENOIDECTOMY     TRANSURETHRAL RESECTION OF PROSTATE         Prior to Admission medications   Medication Sig Start Date End Date Taking? Authorizing Provider   sulfamethoxazole -trimethoprim  (BACTRIM  DS) 800-160 MG tablet Take 1 tablet by mouth 2 (two) times daily for 28 days. 06/15/24 07/13/24 Yes Ryan Lynwood DEL, PA-C  acetaminophen  (TYLENOL ) 500 MG tablet Take 1,000 mg by mouth every 4 (four) hours as needed. 03/31/16   [provider]  ASHWAGANDHA PO Take 1 tablet by mouth daily.    [provider]  co-enzyme Q-10 30 MG capsule Take 30 mg by mouth daily.    [provider]  fluticasone (FLONASE) 50 MCG/ACT nasal spray SHAKE LIQUID AND USE 1 SPRAY(50 MCG) IN EACH NOSTRIL TWICE DAILY 06/12/24   Fleeta Finger, Selinda, MD  levocetirizine (XYZAL) 5 MG tablet Take 5 mg by mouth daily.    [provider]  Meloxicam  7.5 MG TBDP Take 1 tablet by mouth daily as needed. 05/09/24   Fleeta Finger Selinda, MD  Multiple Vitamin (MULTIVITAMIN WITH MINERALS) TABS tablet Take 1 tablet by mouth daily.    [provider]  Probiotic Product (CULTURELLE PROBIOTICS PO) Take 1 tablet by mouth daily.    [provider]  Red Yeast Rice Extract (RED YEAST RICE PO) Take 1 tablet by mouth daily.    [provider]  Turmeric (QC TUMERIC COMPLEX) 500 MG CAPS Take 1 tablet by mouth daily.    [provider]    Allergies: Bee pollen, Pollen extract,  and Short ragweed pollen ext    Review of Systems  Genitourinary:  Positive for dysuria.    Updated Vital Signs BP 137/81 (BP Location: Right Arm)   Pulse 65   Temp 98.1 F (36.7 C) (Oral)   Resp 20   Ht 6' 2 (1.88 m)   Wt 70.3 kg   SpO2 100%   BMI 19.90 kg/m   Physical Exam Vitals and nursing note reviewed.  Constitutional:      General: He is not in acute distress.    Appearance: He is well-developed.  HENT:     Head: Normocephalic and atraumatic.  Eyes:     Conjunctiva/sclera: Conjunctivae normal.  Cardiovascular:     Rate and Rhythm: Normal rate and regular rhythm.     Heart sounds: No murmur heard. Pulmonary:     Effort: Pulmonary effort is normal. No  respiratory distress.     Breath sounds: Normal breath sounds.  Abdominal:     Palpations: Abdomen is soft.     Tenderness: There is abdominal tenderness.     Comments: Mild suprapubic abdominal tenderness, soft nondistended, negative CVAT  Musculoskeletal:        General: No swelling.     Cervical back: Neck supple.  Skin:    General: Skin is warm and dry.     Capillary Refill: Capillary refill takes less than 2 seconds.  Neurological:     Mental Status: He is alert.  Psychiatric:        Mood and Affect: Mood normal.     (all labs ordered are listed, but only abnormal results are displayed) Labs Reviewed  URINALYSIS, ROUTINE W REFLEX MICROSCOPIC - Abnormal; Notable for the following components:      Result Value   Color, Urine STRAW (*)    Leukocytes,Ua TRACE (*)    All other components within normal limits  URINALYSIS, MICROSCOPIC (REFLEX) - Abnormal; Notable for the following components:   Bacteria, UA RARE (*)    All other components within normal limits  COMPREHENSIVE METABOLIC PANEL WITH GFR - Abnormal; Notable for the following components:   Glucose, Bld 103 (*)    All other components within normal limits  RESP PANEL BY RT-PCR (RSV, FLU A&B, COVID)  [id number]  URINE CULTURE  CBC WITH DIFFERENTIAL/PLATELET  LIPASE, BLOOD    EKG: None  Radiology: No results found.   Procedures   Medications Ordered in the ED  iohexol  (OMNIPAQUE ) 300 MG/ML solution 100 mL (has no administration in time range)  sulfamethoxazole -trimethoprim  (BACTRIM  DS) 800-160 MG per tablet 1 tablet (has no administration in time range)  LORazepam  (ATIVAN ) tablet 1 mg (1 mg Oral Given 06/15/24 2115)                                    Medical Decision Making Amount and/or Complexity of Data Reviewed Labs: ordered. Radiology: ordered.  Risk Prescription drug management.   This patient presents to the ED with chief complaint(s) of dysuria.  The complaint involves an extensive differential  diagnosis and also carries with it a high risk of complications and morbidity.   Pertinent past medical history as listed in HPI  The differential diagnosis includes  Prostatitis, UTI, pyelonephritis, STI nephrolithiasis, URI Additional history obtained: Records reviewed Care Everywhere/External Records  Assessment and management:   Hemodynamically stable, nontoxic-appearing patient presented with complaints of dysuria and increased urinary frequency over the past few days.  Reports  history of UTIs and states that this feels very similar.  Is not sexually active.  He does have a history of prostatitis as well.  States his urine did appear a little pink the other day otherwise he has had no gross hematuria.  He has no history of kidney stones.  He does endorse subjective fevers and chills.  Additionally reports some sinus congestion with minimal cough.  His lung sounds are clear.  His abdomen has suprapubic tenderness.  His prostate exam is notable for tender prostate.  He has no CVAT.  His urine is equivocal with trace leukocytes, negative nitrates, rare bacteria, 0-5 WBCs.  He had no significant leukocytosis.  Overall exam and workup are most concerning for prostatitis.  Will send in prescription for Bactrim  and have him follow-up with his urologist.  Independent ECG interpretation:  none  Independent labs interpretation:  The following labs were independently interpreted:  Lipase within normal limits CBC without leukocytosis, hemoglobin stable, CMP without significant abnormality, UA with rare bacteria, negative nitrites, trace leukocytes, 0-5 WBCs  Independent visualization and interpretation of imaging: I independently visualized the following imaging with scope of interpretation limited to determining acute life threatening conditions related to emergency care: none    Consultations obtained:   none  Disposition:   Patient will be discharged home. The patient has been appropriately  medically screened and/or stabilized in the ED. I have low suspicion for any other emergent medical condition which would require further screening, evaluation or treatment in the ED or require inpatient management. At time of discharge the patient is hemodynamically stable and in no acute distress. I have discussed work-up results and diagnosis with patient and answered all questions. Patient is agreeable with discharge plan. We discussed strict return precautions for returning to the emergency department and they verbalized understanding.     Social Determinants of Health:   None This note was dictated with voice recognition software.  Despite best efforts at proofreading, errors may have occurred which can change the documentation meaning.       Final diagnoses:  Acute prostatitis    ED Discharge Orders          Ordered    sulfamethoxazole -trimethoprim  (BACTRIM  DS) 800-160 MG tablet  2 times daily        06/15/24 2239               Ryan [name] 06/15/24 2239    Dreama Longs, MD 06/16/24 1222

## 2024-06-15 NOTE — Discharge Instructions (Addendum)
 You were evaluated in the emergency room for dysuria.  Your workup and exam was most concerning for prostatitis.  A prescription for antibiotics were sent to your pharmacy.  Please be sure to complete the full course of antibiotics.  I would also recommend follow-up with urologist.  If you experience any new or worsening symptoms please return to emergency room.

## 2024-06-17 LAB — URINE CULTURE: Culture: <10000 — AB

## 2024-06-25 DIAGNOSIS — R350 Frequency of micturition: Secondary | ICD-10-CM | POA: Diagnosis not present

## 2024-06-30 ENCOUNTER — Encounter: Payer: Self-pay | Admitting: Internal Medicine

## 2024-06-30 ENCOUNTER — Ambulatory Visit: Admitting: Internal Medicine

## 2024-06-30 VITALS — BP 120/70 | HR 76 | Temp 97.7°F | Resp 18 | Ht 74.0 in | Wt 146.5 lb

## 2024-06-30 DIAGNOSIS — N410 Acute prostatitis: Secondary | ICD-10-CM | POA: Insufficient documentation

## 2024-06-30 DIAGNOSIS — R634 Abnormal weight loss: Secondary | ICD-10-CM | POA: Insufficient documentation

## 2024-06-30 NOTE — Assessment & Plan Note (Signed)
 HE is not taking a lot of calories in.  I asked him to schedule his clock on his phone to remind him to eat.  I gave him samples of boost which he can take twice a day.  We will continue to follow.

## 2024-06-30 NOTE — Progress Notes (Signed)
 Office Visit  Subjective   Patient ID: Ryan [name]   DOB: 1960/12/03   Age: 63 y.o.   MRN: [id number]   Chief Complaint Chief Complaint  Patient presents with   ER follow up     History of Present Illness [name] is a 63 y.o. male who comes in today for a hospital ER followup for [facility] for prostatitis on 06/15/2024.  He presented to them with complaints of dysuria increased frequency over the past few days prior to presenation.  He endorsed subjective fevers and chills.  Has had some achiness in his hips perineal region.  No significant flank pain.  Does endorse some nasal congestion, minimal cough.  They did a CBC that showed a normal WBC and his UA was normal.  They felt he had prostatitis and sent him home on bactrim  DS BID for 28 days.  I have reviewed his urine culture and this was insignificant as it grew <10K CFU.  He states that since starting the antibiotics, he is doing better.   The patient returns for followup of his depression and anxiety as well as OCD.  Today, he has appointment with Ryan [name] who is a therapist.  He was told by that group that he does ADHD but they have not started him on any medications.  He is seeing a psychiatrist in [place] who is putting him thru a program called [facility].  He is currently not on any medications at this time.  He states he an option for cognitive behavioral therapy when he spoke to his provider last time.  Today, he states his depression is mild and his anxiety is moderate. He was on viibryd but stopped this and he is currently just on supplments.  He denies any panic attacks.  He has a history of anxiety/OCD/Depression.  However, he states he is now having more OCD patterns and is requesting to go back on fluvoxamine .  He was placed on this for his GAD/OCD in early summer of 2022.  He was seeing a psychiatrist in [place] where they started him on Remeron.  He no longer goes to this psychiatrist and is looking going back  to [place].   He is currently on disability due to his OCD where he has been on disability since 2008. He was on fluvoxamine  (Luvox ) for about 15 years (again he is not on any medications but he does take supplements). He was followed by [facility] where he is seeing a Haematologist and psychiatrist in [place] at this time. He denies helpless feeling and suicidal ideation. This patient feels that she is able to care for himself. He currently lives alone. He has a history of being admitted to a psychiatric hospital in 1990's. There was no suicidal ideation at that time.  He saw [facility] and has seen them twice with last visit last week.  They started him on a trial of methylphenidate ER 15 mg daily for possible ADHD.  He has been on this for a week but did not feel it was helping so they just recently increased it to methylphendiate ER 27mg  daily.  He states this is helping.    The patient has lost more weight over the last 2 weeks he has lost another 9 lbs.  He wonders if his anxiety is causing him to have weight loss.  He has seen a nutritionist who has told him to not go more than 3 hours without eating.  He admits he  is missing meals.  He states he does have an appetite but again he is anxious and busy at times so he does not eat.  He states he is not taking enough calories like the nutritionist told her.     Past Medical History Past Medical History:  Diagnosis Date   Anemia    hx   Anxiety    Arthritis    joints of fingers; ankles (04/29/2018)   BPH (benign prostatic hyperplasia)    Bulging lumbar disc dx'd 06/2016   Cellulitis of leg, right 04/28/2018   Chronic sinusitis    Daily headache    daily lately; at least weekly (04/29/2018)   Family history of breast cancer 01/16/2022   Family history of gastric cancer 01/16/2022   GERD (gastroesophageal reflux disease)    Hepatitis C ~ 2000   turned yellow; jaundiced; not sure which kind of hepatitis (04/29/2018)   IBS (irritable  bowel syndrome)    Left lumbar radiculopathy    OCD (obsessive compulsive disorder)    OCD (obsessive compulsive disorder)    Overactive bladder    Reiter's arthritis involving lower leg, unspecified laterality (HCC)    Reiter's syndrome    Sacrococcygeal pain    Urethral stricture    Urge incontinence      Allergies Allergies  Allergen Reactions   Bee Pollen Other (See Comments)   Pollen Extract Other (See Comments)   Short Ragweed Pollen Ext Other (See Comments)    sneezing sneezing      Medications  Current Outpatient Medications:    co-enzyme Q-10 30 MG capsule, Take 30 mg by mouth daily., Disp: , Rfl:    levocetirizine (XYZAL) 5 MG tablet, Take 5 mg by mouth daily., Disp: , Rfl:    methylphenidate 27 MG PO CR tablet, Take 27 mg by mouth every morning., Disp: , Rfl:    Multiple Vitamin (MULTIVITAMIN WITH MINERALS) TABS tablet, Take 1 tablet by mouth daily., Disp: , Rfl:    Probiotic Product (CULTURELLE PROBIOTICS PO), Take 1 tablet by mouth daily., Disp: , Rfl:    Red Yeast Rice Extract (RED YEAST RICE PO), Take 1 tablet by mouth daily., Disp: , Rfl:    sulfamethoxazole -trimethoprim  (BACTRIM  DS) 800-160 MG tablet, Take 1 tablet by mouth 2 (two) times daily for 28 days., Disp: 56 tablet, Rfl: 0   Turmeric (QC TUMERIC COMPLEX) 500 MG CAPS, Take 1 tablet by mouth daily., Disp: , Rfl:    acetaminophen  (TYLENOL ) 500 MG tablet, Take 1,000 mg by mouth every 4 (four) hours as needed., Disp: , Rfl:    ASHWAGANDHA PO, Take 1 tablet by mouth daily., Disp: , Rfl:    fluticasone (FLONASE) 50 MCG/ACT nasal spray, SHAKE LIQUID AND USE 1 SPRAY(50 MCG) IN EACH NOSTRIL TWICE DAILY, Disp: 144 g, Rfl: 1   Meloxicam  7.5 MG TBDP, Take 1 tablet by mouth daily as needed., Disp: , Rfl:    Review of Systems Review of Systems  Constitutional:  Positive for weight loss. Negative for chills, fever and malaise/fatigue.  Respiratory:  Negative for cough and shortness of breath.   Cardiovascular:   Negative for chest pain, palpitations and leg swelling.  Gastrointestinal:  Negative for abdominal pain, constipation, diarrhea, heartburn, nausea and vomiting.  Genitourinary:  Negative for dysuria and frequency.  Musculoskeletal:  Negative for myalgias.  Skin:  Negative for itching and rash.  Neurological:  Negative for dizziness, weakness and headaches.       Objective:    Vitals BP 120/70 (BP  Location: Left Arm, Patient Position: Sitting, Cuff Size: Normal)   Pulse 76   Temp 97.7 F (36.5 C)   Resp 18   Ht 6' 2 (1.88 m)   Wt 146 lb 8 oz (66.5 kg)   SpO2 98%   BMI 18.81 kg/m    Physical Examination Physical Exam Constitutional:      Appearance: Normal appearance. He is not ill-appearing.  Cardiovascular:     Rate and Rhythm: Normal rate and regular rhythm.     Pulses: Normal pulses.     Heart sounds: No murmur heard.    No friction rub. No gallop.  Pulmonary:     Effort: Pulmonary effort is normal. No respiratory distress.     Breath sounds: No wheezing, rhonchi or rales.  Abdominal:     General: Abdomen is flat. Bowel sounds are normal. There is no distension.     Palpations: Abdomen is soft.     Tenderness: There is no abdominal tenderness.  Musculoskeletal:     Right lower leg: No edema.     Left lower leg: No edema.  Skin:    General: Skin is warm and dry.     Findings: No rash.  Neurological:     Mental Status: He is alert.        Assessment & Plan:   Acute prostatitis I want him to continue his Bactrim  DS at this time.  Weight loss HE is not taking a lot of calories in.  I asked him to schedule his clock on his phone to remind him to eat.  I gave him samples of boost which he can take twice a day.  We will continue to follow.    No follow-ups on file.   Selinda Fleeta Finger, MD

## 2024-06-30 NOTE — Assessment & Plan Note (Signed)
 I want him to continue his Bactrim  DS at this time.

## 2024-07-16 DIAGNOSIS — M79604 Pain in right leg: Secondary | ICD-10-CM | POA: Diagnosis not present

## 2024-07-16 DIAGNOSIS — L299 Pruritus, unspecified: Secondary | ICD-10-CM | POA: Diagnosis not present

## 2024-07-16 DIAGNOSIS — L300 Nummular dermatitis: Secondary | ICD-10-CM | POA: Diagnosis not present

## 2024-07-16 DIAGNOSIS — M79605 Pain in left leg: Secondary | ICD-10-CM | POA: Diagnosis not present

## 2024-07-16 DIAGNOSIS — D485 Neoplasm of uncertain behavior of skin: Secondary | ICD-10-CM | POA: Diagnosis not present

## 2024-07-23 DIAGNOSIS — L111 Transient acantholytic dermatosis [Grover]: Secondary | ICD-10-CM | POA: Diagnosis not present

## 2024-07-30 DIAGNOSIS — E7889 Other lipoprotein metabolism disorders: Secondary | ICD-10-CM | POA: Diagnosis not present

## 2024-07-30 DIAGNOSIS — L111 Transient acantholytic dermatosis [Grover]: Secondary | ICD-10-CM | POA: Diagnosis not present

## 2024-07-30 DIAGNOSIS — R350 Frequency of micturition: Secondary | ICD-10-CM | POA: Diagnosis not present

## 2024-08-08 ENCOUNTER — Encounter: Payer: Self-pay | Admitting: Internal Medicine

## 2024-08-08 ENCOUNTER — Ambulatory Visit: Admitting: Internal Medicine

## 2024-08-08 VITALS — BP 124/62 | HR 102 | Temp 98.3°F | Resp 18 | Wt 147.2 lb

## 2024-08-08 DIAGNOSIS — F902 Attention-deficit hyperactivity disorder, combined type: Secondary | ICD-10-CM

## 2024-08-08 DIAGNOSIS — N410 Acute prostatitis: Secondary | ICD-10-CM

## 2024-08-08 LAB — POCT URINALYSIS DIPSTICK
Bilirubin, UA: NEGATIVE
Blood, UA: NEGATIVE
Glucose, UA: NEGATIVE
Ketones, UA: NEGATIVE
Leukocytes, UA: NEGATIVE
Nitrite, UA: NEGATIVE
Protein, UA: NEGATIVE
Spec Grav, UA: 1.01 (ref 1.010–1.025)
Urobilinogen, UA: 0.2 U/dL
pH, UA: 6 (ref 5.0–8.0)

## 2024-08-08 MED ORDER — CIPROFLOXACIN HCL 500 MG PO TABS: 500.0000 mg | ORAL_TABLET | Freq: Two times a day (BID) | ORAL | 0 refills | Status: AC

## 2024-08-08 NOTE — Assessment & Plan Note (Signed)
 He will followup with psychiatry.

## 2024-08-08 NOTE — Progress Notes (Signed)
 Office Visit  Subjective   Patient ID: Ryan [name]   DOB: 04/10/1961   Age: 63 y.o.   MRN: [id number]   Chief Complaint Chief Complaint  Patient presents with   Acute Visit    Pt reports he needs a prostate exam.     History of Present Illness Ryan [name] is a 63 y.o. male who returns today for new complaints of urinary frequency with pain/pressure of his perineum and pain in his thighs.  He has more urgency and more frequency.  There is no dysuria, hematuria, abdominal pain, or nausea/vomiting.  He is having some chills.  However, this is the second time in 2 months he has come down with the same symptoms.  He was seen in the hospital ER followup for [facility] for prostatitis on 06/15/2024.  He presented to them with complaints of dysuria increased frequency over the past few days prior to presenation.  He endorsed subjective fevers and chills.  Has had some achiness in his hips perineal region.  No significant flank pain.  Does endorse some nasal congestion, minimal cough.  They did a CBC that showed a normal WBC and his UA was normal.  They felt he had prostatitis and sent him home on bactrim  DS BID for 28 days.  I have reviewed his previous urine culture and this was insignificant as it grew <10K CFU.  He took his bactrim  and did do better but began having the above symptoms return about 3 days ago.  The patient also has a history of ADHD as well as depression/anxiety and OCD.  He is now seeing a new therapist at [facility].  He was started on methylphenidate CR 27mg  po daily and has been on this for a month.  He has not lost any weight since his last visit.  He is seeing a psychiatrist in [place] who is putting him thru a program called [facility].  He is currently not on any medications at this time.  He states he an option for cognitive behavioral therapy when he spoke to his provider last time.  Today, he states his depression is mild and his anxiety is moderate. He was on  viibryd but stopped this and he is currently just on supplments.  He denies any panic attacks.  He has a history of anxiety/OCD/Depression.  However, he states he is now having more OCD patterns and is requesting to go back on fluvoxamine .  He was placed on this for his GAD/OCD in early summer of 2022.  He was seeing a psychiatrist in [place] where they started him on Remeron.  He no longer goes to this psychiatrist and is looking going back to [place].   He is currently on disability due to his OCD where he has been on disability since 2008. He was on fluvoxamine  (Luvox ) for about 15 years (again he is not on any medications but he does take supplements). He was followed by [facility] where he is seeing a Haematologist and psychiatrist in [place] at this time. He denies helpless feeling and suicidal ideation. This patient feels that she is able to care for himself. He currently lives alone. He has a history of being admitted to a psychiatric hospital in 1990's. There was no suicidal ideation at that time.       Past Medical History Past Medical History:  Diagnosis Date   Anemia    hx   Anxiety    Arthritis    joints of  fingers; ankles (04/29/2018)   BPH (benign prostatic hyperplasia)    Bulging lumbar disc dx'd 06/2016   Cellulitis of leg, right 04/28/2018   Chronic sinusitis    Daily headache    daily lately; at least weekly (04/29/2018)   Family history of breast cancer 01/16/2022   Family history of gastric cancer 01/16/2022   GERD (gastroesophageal reflux disease)    Hepatitis C ~ 2000   turned yellow; jaundiced; not sure which kind of hepatitis (04/29/2018)   IBS (irritable bowel syndrome)    Left lumbar radiculopathy    OCD (obsessive compulsive disorder)    OCD (obsessive compulsive disorder)    Overactive bladder    Reiter's arthritis involving lower leg, unspecified laterality (HCC)    Reiter's syndrome    Sacrococcygeal pain    Urethral stricture    Urge incontinence       Allergies Allergies  Allergen Reactions   Short Ragweed Pollen Ext Other (See Comments)    sneezing sneezing      Medications  Current Outpatient Medications:    ASHWAGANDHA PO, Take 1 tablet by mouth daily., Disp: , Rfl:    co-enzyme Q-10 30 MG capsule, Take 30 mg by mouth daily., Disp: , Rfl:    fluticasone (FLONASE) 50 MCG/ACT nasal spray, SHAKE LIQUID AND USE 1 SPRAY(50 MCG) IN EACH NOSTRIL TWICE DAILY, Disp: 144 g, Rfl: 1   magnesium gluconate (MAGONATE) 500 (27 Mg) MG TABS tablet, Take 500 mg by mouth in the morning and at bedtime., Disp: , Rfl:    methylphenidate 27 MG PO CR tablet, Take 27 mg by mouth every morning., Disp: , Rfl:    Multiple Vitamin (MULTIVITAMIN WITH MINERALS) TABS tablet, Take 1 tablet by mouth daily., Disp: , Rfl:    Probiotic Product (CULTURELLE PROBIOTICS PO), Take 1 tablet by mouth daily., Disp: , Rfl:    Red Yeast Rice Extract (RED YEAST RICE PO), Take 1 tablet by mouth daily., Disp: , Rfl:    Turmeric (QC TUMERIC COMPLEX) 500 MG CAPS, Take 1 tablet by mouth daily., Disp: , Rfl:    Review of Systems Review of Systems  Constitutional:  Negative for chills and fever.  Respiratory:  Negative for shortness of breath.   Cardiovascular:  Negative for chest pain.  Gastrointestinal:  Negative for abdominal pain, constipation, diarrhea, nausea and vomiting.  Genitourinary:  Positive for frequency and urgency. Negative for dysuria, flank pain and hematuria.  Musculoskeletal:  Negative for myalgias.  Skin:  Positive for rash. Negative for itching.  Neurological:  Negative for dizziness, weakness and headaches.       Objective:    Vitals BP 124/62   Pulse (!) 102   Temp 98.3 F (36.8 C) (Temporal)   Resp 18   Wt 147 lb 3.2 oz (66.8 kg)   SpO2 97%   BMI 18.90 kg/m    Physical Examination Physical Exam Constitutional:      Appearance: Normal appearance. He is not ill-appearing.  Cardiovascular:     Rate and Rhythm: Normal rate and regular  rhythm.     Pulses: Normal pulses.     Heart sounds: No murmur heard.    No friction rub. No gallop.  Pulmonary:     Effort: Pulmonary effort is normal. No respiratory distress.     Breath sounds: No wheezing, rhonchi or rales.  Abdominal:     General: Abdomen is flat. Bowel sounds are normal. There is no distension.     Palpations: Abdomen is soft.  Tenderness: There is no abdominal tenderness.  Musculoskeletal:     Right lower leg: No edema.     Left lower leg: No edema.  Skin:    General: Skin is warm and dry.     Findings: No rash.  Neurological:     Mental Status: He is alert.        Assessment & Plan:   Acute prostatitis This is second time he has had probable prostatitis.  I am going to refer him to urology.  We will get a UA, GC and chlamydia testing.  We will start him on cipro .  Attention deficit hyperactivity disorder (ADHD), combined type He will followup with psychiatry.    Return in about 3 months (around 11/07/2024) for annual.   Selinda Fleeta Finger, MD

## 2024-08-08 NOTE — Assessment & Plan Note (Signed)
 This is second time he has had probable prostatitis.  I am going to refer him to urology.  We will get a UA, GC and chlamydia testing.  We will start him on cipro .

## 2024-08-12 LAB — GC/CHLAMYDIA PROBE AMP
Chlamydia trachomatis, NAA: NEGATIVE
Neisseria Gonorrhoeae by PCR: NEGATIVE

## 2024-08-18 ENCOUNTER — Ambulatory Visit: Payer: Self-pay

## 2024-08-18 NOTE — Progress Notes (Signed)
 Patient called.  Patient aware.

## 2024-10-17 ENCOUNTER — Ambulatory Visit: Admitting: Internal Medicine

## 2024-10-17 ENCOUNTER — Encounter: Payer: Self-pay | Admitting: Internal Medicine

## 2024-10-17 VITALS — BP 110/62 | HR 88 | Temp 98.3°F | Resp 16 | Ht 73.0 in | Wt 156.2 lb

## 2024-10-17 DIAGNOSIS — K219 Gastro-esophageal reflux disease without esophagitis: Secondary | ICD-10-CM

## 2024-10-17 DIAGNOSIS — N3281 Overactive bladder: Secondary | ICD-10-CM

## 2024-10-17 DIAGNOSIS — F902 Attention-deficit hyperactivity disorder, combined type: Secondary | ICD-10-CM

## 2024-10-17 DIAGNOSIS — Z0000 Encounter for general adult medical examination without abnormal findings: Secondary | ICD-10-CM

## 2024-10-17 DIAGNOSIS — R3911 Hesitancy of micturition: Secondary | ICD-10-CM

## 2024-10-17 DIAGNOSIS — Z6820 Body mass index (BMI) 20.0-20.9, adult: Secondary | ICD-10-CM | POA: Insufficient documentation

## 2024-10-17 DIAGNOSIS — F411 Generalized anxiety disorder: Secondary | ICD-10-CM

## 2024-10-17 DIAGNOSIS — J309 Allergic rhinitis, unspecified: Secondary | ICD-10-CM

## 2024-10-17 DIAGNOSIS — E7800 Pure hypercholesterolemia, unspecified: Secondary | ICD-10-CM

## 2024-10-17 DIAGNOSIS — F330 Major depressive disorder, recurrent, mild: Secondary | ICD-10-CM

## 2024-10-17 DIAGNOSIS — M5136 Other intervertebral disc degeneration, lumbar region with discogenic back pain only: Secondary | ICD-10-CM

## 2024-10-17 DIAGNOSIS — F429 Obsessive-compulsive disorder, unspecified: Secondary | ICD-10-CM

## 2024-10-17 NOTE — Progress Notes (Unsigned)
 Office Visit  Subjective   Patient ID: Ryan [name]   DOB: 02/09/1961   Age: 63 y.o.   MRN: [id number]   Chief Complaint Chief Complaint  Patient presents with  . Annual Exam    Annual Exam CPE non-fasting. Pt has concerns about cold nose and feet with some post nasal drip taking allegra 180 mg once every 12 hours.      History of Present Illness Ryan [name] is a 63 year old Caucasian/White male who presents for his annual health maintenance exam. He is due for the following health maintenance studies: screening labs. This patient's past medical history Alopecia, Anxiety Disorder, Benign Prostatic Hypertrophy, Depression, GERD, and Hypercholesterolemia.    His last eye exam was in 09/2023 and he states his vision is doing well. His last colonoscopy was in 10/11/2020 and this showed a tubular adenoma polyp. He was having hematochezia but they felt this was due to hemorrhoids. They want to repeat a colonoscopy in 10 years. His previous colonoscopy was in 08/2015 and this was normal. He was being followed by Urology for an overactive bladder where he was on Flomax which he stopped. He states it made him sleepy. He was undergoing therapy for pelvic wall exercises for his overactive bladder.  The patient does exercise regularly by walking. The patient does get yearly flu vaccines. He has had 3 COVID-19 vaccines. There is no family history of heart disease. He is not on an ASA.   I did see him a few months ago where he had some acute prostatitis There was no dysuria, hematuria, abdominal pain, or nausea/vomiting.  He was seen in the hospital ER followup for [facility] for prostatitis on 06/15/2024.  He presented to them with complaints of dysuria increased frequency over the past few days prior to presenation.  He endorsed subjective fevers and chills.  Has had some achiness in his hips perineal region.  No significant flank pain.  Does endorse some nasal congestion, minimal cough.  They  did a CBC that showed a normal WBC and his UA was normal.  They felt he had prostatitis and sent him home on bactrim  DS BID for 28 days.  I have reviewed his previous urine culture and this was insignificant as it grew <10K CFU.  He took his bactrim  and did do better but began having the above symptoms return about 3 days ago.  I placed him on an extended course of cipro  and his symptoms resolved.   The patient also has a history of ADHD as well as depression/anxiety and OCD.  He has been seeing a new therapist at [facility].  He was started on methylphenidate CR 27mg  po daily and but was switched to Qelbree 200mg  daily which he started about a month ago.  He was losing weight in prior visits but this is not a problem yet.  He is seeing a psychiatrist in [place] who is putting him thru a program called [facility].  He is currently not on any medications at this time.  He states he an option for cognitive behavioral therapy when he spoke to his provider last time.  Today, he states his depression is mild and his anxiety is moderate. He was on viibryd but stopped this and he is currently just on supplments.  He denies any panic attacks.  He has a history of anxiety/OCD/Depression.  However, he states he is now having more OCD patterns and is requesting to go back on  fluvoxamine .  He was placed on this for his GAD/OCD in early summer of 2022.  He was seeing a psychiatrist in [place] where they started him on Remeron.  He no longer goes to this psychiatrist and is looking going back to [place].   He is currently on disability due to his OCD where he has been on disability since 2008. He was on fluvoxamine  (Luvox ) for about 15 years (again he is not on any medications but he does take supplements). He was followed by [facility] where he is seeing a haematologist and psychiatrist in [place] at this time. He denies helpless feeling and suicidal ideation. This patient feels that she is able to care for himself. He  currently lives alone. He has a history of being admitted to a psychiatric hospital in 1990's. There was no suicidal ideation at that time.  He also see dermatology where he has had pruritic rash on his back.  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  He states this has helped and he is currently taking his second dose tonight.  He is not having any side effects from this.    Ryan [name] returns for followup of his chronic back pain syndrome.  He has a history of DDD and mild lumbar spinal stenosis where he has seen both neurosurgery and neurology.  He was seen in 2018 by Dr. Mavis in neurosurgery who did a MRI of his spine on 05/01/2017 which showed multifocal degenerative disc disease from L2-L3 down to L5-S1.  There was L3-L4 left sided disc bulging with mass effect at left L4 nerve root with mild spinal stenosis of L4 and bilateral lateral recess stenosis at L3-L4.  They diagnosed him with chronic back pain syndrome at that time but noted he had no radicular symptoms and did not recommend surgery.  He was then seen by Dr. Tripplett/Dr. Tanda in neurology where he tells me they did additional biochemical testing for his back pain which was negative.  He was told that they felt that he had the above diagnoses that neurosurgery gave him with DDD and recommended ESI, physical therapy and/or acupuncture.  He last saw Dr. Tonuzi in neurology on 07/16/2024 where he felt the patient had lower extremity sensory changes that were intermittent.  He was not sure of the etiology.  He had a MRI L-spine on 12/27/2023 with degenerative changes but no clear neural element impingement. They discussed possibility of agents such as Cymbalta and amitriptyline which he declined.  The patient denies any weakness and no loss of bowel/bladder problems.  He states he has refused ESI after reading about it and never underwent acupuncture.  The  patient denies any back pain at this time.  Ryan [name] returns today for routine followup on his cholesterol.  On his yearly exam in 01/2022, his cholesterol was elevated and we discovered his ASCVD score was 10.8% at that time.  I aske him to start pravastatin and he states that he took it for 5 days and then stopped it due to it causing myalgias in his right arm.   He states he is on a herbal supplement taking both red yeast rice and Fenugreek.  He is also drinking beet juice.  We checked his FLP in 2018 and his ASCVD score was 3.3% at that time. Overall, he states he is doing well and is without any complaints or problems at this time. He  specifically denies abdominal pain, nausea, vomiting, diarrhea, myalgias, and fatigue. He remains on dietary management and supplements as described as well as red yeast rice. He is fasting today.  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  This was found years ago by Dr. Rubin in orthopedics.  He has a history of reactive arthritis with Reiter's Syndrome.  Dr. Rubin told him at that time he had Reiter's Syndrome but today he denies any problems with this.  This past year he had problems with his left hip  He had a steriod injection of his left hip.  We referred him to ortho who did a MRI of his left hip on 02/29/2024.  This demonstrated no hip fracture, dislocation or avascular necrosis.  There is a partial-thickness tear of the periphery of the left gluteus minimus and medius muscles with surrounding muscle edema.  There was a small partial-thickness tear of the right gluteus medius tendon insertion.  There was mild-moderate partial-thickness cartilage loss of the femoral head and acetabulum bilaterally.  There was a left anterosuperior labral tear.  The patient has sent him for physical therapy where his pain resolved after therapy.     In 2022, we noted that he had some mild  leukopenia.  I did refer him to Hematology  where they repeated his WBC and it was normal in 12/2021.  They were not sure what the cause of his mild intermittent leukopenia was due to but the differential included infection, medications and possible a normal variant.  He tells me that they have done genetic testing and he is waiting on these results.     The patient has a history of intermittent bouts of rhinitis and sinusitis felt to be due to weather or environmental exposures. The patient's past medical history is noncontributory. He has used Flonase and nasal saline washes per ENT's recommendations.  He has seen ENT and had rigid endoscopy on 05/13/2021 where he saw no evidence of recurrent sinus infection.  The patient also has a history of allergic rhinitis.  He has seen Dr. Honor in ENT where they did nasal endoscopy. He has also seen Dr. Jeneal in Allergy in 02/2018 and they did allergy testing where he is allergy to dogs, cats and mold.  They continued him on xyzal and flonase and started him on astelin  nasal spray but he does not take this regularly.   He has a history of overactive bladder, the patient has been seen by urology for his BOO and urinary urgency and frequency.  They performed a cystoscopy on him that showed no obstructing pathology with a widely patient prostatic urethra.  They believe the majority of his urinary symptoms are centered around pelvic floor muscle tension and dyssenrgia when voiding which is compounded by ongoing OCD and anxiety.  They want him to continue with Flomax and pelvic floor physical therapy.  The patient has been on Myrbetriq  in the past.  He did have a botox injection in 08/2019 for his bladder.  Again, he did stop his Flomax.  He is seeing a new urologist next week.       The patient also has a history of reflux.  He denies any symptoms of reflux at this time and he is currently using a probiotic.     He also uses valtrex prn for cold sores.  He states he will  have cold sores maybe twice a year.  He also has a history of varicose veins where he saw the [facility] in [place].  They are recommending him to wear compression hose and he has had varicose injections which resolved a lot of his lower extremity spider veins.        Past Medical History Past Medical History:  Diagnosis Date  . Anemia    hx  . Anxiety   . Arthritis    joints of fingers; ankles (04/29/2018)  . BPH (benign prostatic hyperplasia)   . Bulging lumbar disc dx'd 06/2016  . Cellulitis of leg, right 04/28/2018  . Chronic sinusitis   . Daily headache    daily lately; at least weekly (04/29/2018)  . Family history of breast cancer 01/16/2022  . Family history of gastric cancer 01/16/2022  . GERD (gastroesophageal reflux disease)   . Hepatitis C ~ 2000   turned yellow; jaundiced; not sure which kind of hepatitis (04/29/2018)  . IBS (irritable bowel syndrome)   . Left lumbar radiculopathy   . OCD (obsessive compulsive disorder)   . OCD (obsessive compulsive disorder)   . Overactive bladder   . Reiter's arthritis involving lower leg, unspecified laterality (HCC)   . Reiter's syndrome   . Sacrococcygeal pain   . Urethral stricture   . Urge incontinence      Allergies Allergies  Allergen Reactions  . Short Ragweed Pollen Ext Other (See Comments)    sneezing sneezing      Medications  Current Outpatient Medications:  .  ASHWAGANDHA PO, Take 1 tablet by mouth daily., Disp: , Rfl:  .  co-enzyme Q-10 30 MG capsule, Take 30 mg by mouth daily., Disp: , Rfl:  .  DUPIXENT 300 MG/2ML SOAJ, Inject 200 mg into the muscle every 14 (fourteen) days., Disp: , Rfl:  .  magnesium gluconate (MAGONATE) 500 (27 Mg) MG TABS tablet, Take 500 mg by mouth in the morning and at bedtime., Disp: , Rfl:  .  Multiple Vitamin (MULTIVITAMIN WITH MINERALS) TABS tablet, Take 1 tablet by mouth daily., Disp: , Rfl:  .  Probiotic Product (CULTURELLE PROBIOTICS PO), Take 1 tablet by mouth  daily., Disp: , Rfl:  .  QELBREE 200 MG 24 hr capsule, Take 200 mg by mouth daily., Disp: , Rfl:  .  Red Yeast Rice Extract (RED YEAST RICE PO), Take 1 tablet by mouth daily., Disp: , Rfl:  .  Turmeric (QC TUMERIC COMPLEX) 500 MG CAPS, Take 1 tablet by mouth daily., Disp: , Rfl:    Review of Systems Review of Systems  Constitutional:  Negative for chills, fever and malaise/fatigue.  Eyes:  Negative for blurred vision and double vision.  Respiratory:  Negative for cough, hemoptysis, shortness of breath and wheezing.   Cardiovascular:  Negative for chest pain, palpitations and leg swelling.  Gastrointestinal:  Negative for abdominal pain, blood in stool, constipation, diarrhea, heartburn, melena, nausea and vomiting.  Genitourinary:  Positive for frequency. Negative for hematuria.  Musculoskeletal:  Negative for myalgias.  Skin:  Positive for itching. Negative for rash.  Neurological:  Negative for dizziness, weakness and headaches.  Endo/Heme/Allergies:  Negative for polydipsia.       Objective:    Vitals BP 110/62   Pulse 88   Temp 98.3 F (36.8 C) (Temporal)   Resp 16   Ht 6' 1 (1.854 m)   Wt 156 lb 3.2 oz (70.9 kg)   SpO2 98%   BMI 20.61 kg/m    Physical Examination Physical Exam Constitutional:  Appearance: Normal appearance. He is not ill-appearing.  HENT:     Head: Normocephalic and atraumatic.     Right Ear: Tympanic membrane, ear canal and external ear normal.     Left Ear: Tympanic membrane, ear canal and external ear normal.     Nose: Nose normal. No congestion or rhinorrhea.     Mouth/Throat:     Mouth: Mucous membranes are moist.     Pharynx: Oropharynx is clear. No oropharyngeal exudate.  Eyes:     General: No scleral icterus.    Conjunctiva/sclera: Conjunctivae normal.     Pupils: Pupils are equal, round, and reactive to light.  Neck:     Vascular: No carotid bruit.  Cardiovascular:     Rate and Rhythm: Normal rate and regular rhythm.      Pulses: Normal pulses.     Heart sounds: No murmur heard.    No friction rub. No gallop.  Pulmonary:     Effort: Pulmonary effort is normal. No respiratory distress.     Breath sounds: No wheezing, rhonchi or rales.  Abdominal:     General: Abdomen is flat. Bowel sounds are normal. There is no distension.     Palpations: Abdomen is soft.     Tenderness: There is no abdominal tenderness.  Musculoskeletal:     Cervical back: Neck supple. No tenderness.     Right lower leg: No edema.     Left lower leg: No edema.  Lymphadenopathy:     Cervical: No cervical adenopathy.  Skin:    General: Skin is warm and dry.     Findings: No rash.  Neurological:     General: No focal deficit present.     Mental Status: He is alert and oriented to person, place, and time.  Psychiatric:        Mood and Affect: Mood normal.        Behavior: Behavior normal.        Assessment & Plan:   No problem-specific Assessment & Plan notes found for this encounter.    No follow-ups on file.   Selinda Fleeta Finger, MD

## 2024-10-18 LAB — CMP14 + ANION GAP
ALT: 23 IU/L (ref 0–44)
AST: 25 IU/L (ref 0–40)
Albumin: 4.3 g/dL (ref 3.9–4.9)
Alkaline Phosphatase: 62 IU/L (ref 47–123)
Anion Gap: 12 mmol/L (ref 10.0–18.0)
BUN/Creatinine Ratio: 18 (ref 10–24)
BUN: 18 mg/dL (ref 8–27)
Bilirubin Total: 0.4 mg/dL (ref 0.0–1.2)
CO2: 26 mmol/L (ref 20–29)
Calcium: 9.1 mg/dL (ref 8.6–10.2)
Chloride: 101 mmol/L (ref 96–106)
Creatinine, Ser: 0.99 mg/dL (ref 0.76–1.27)
Globulin, Total: 2 g/dL (ref 1.5–4.5)
Glucose: 85 mg/dL (ref 70–99)
Potassium: 4.8 mmol/L (ref 3.5–5.2)
Sodium: 139 mmol/L (ref 134–144)
Total Protein: 6.3 g/dL (ref 6.0–8.5)
eGFR: 86 mL/min/1.73 (ref >59)

## 2024-10-18 LAB — CBC WITH DIFFERENTIAL/PLATELET
Basophils Absolute: 0 x10E3/uL (ref 0.0–0.2)
Basos: 1 %
EOS (ABSOLUTE): 0.1 x10E3/uL (ref 0.0–0.4)
Eos: 3 %
Hematocrit: 40.9 % (ref 37.5–51.0)
Hemoglobin: 13.9 g/dL (ref 13.0–17.7)
Immature Grans (Abs): 0 x10E3/uL (ref 0.0–0.1)
Immature Granulocytes: 0 %
Lymphocytes Absolute: 1.3 x10E3/uL (ref 0.7–3.1)
Lymphs: 34 %
MCH: 33.1 pg — ABNORMAL HIGH (ref 26.6–33.0)
MCHC: 34 g/dL (ref 31.5–35.7)
MCV: 97 fL (ref 79–97)
Monocytes Absolute: 0.4 x10E3/uL (ref 0.1–0.9)
Monocytes: 11 %
Neutrophils Absolute: 2 x10E3/uL (ref 1.4–7.0)
Neutrophils: 51 %
Platelets: 196 x10E3/uL (ref 150–450)
RBC: 4.2 x10E6/uL (ref 4.14–5.80)
RDW: 11.7 % (ref 11.6–15.4)
WBC: 3.8 x10E3/uL (ref 3.4–10.8)

## 2024-10-18 LAB — PSA: Prostate Specific Ag, Serum: 0.6 ng/mL (ref 0.0–4.0)

## 2024-10-18 LAB — HEMOGLOBIN A1C
Est. average glucose Bld gHb Est-mCnc: 111 mg/dL
Hgb A1c MFr Bld: 5.5 % (ref 4.8–5.6)

## 2024-10-18 LAB — LIPID PANEL
Chol/HDL Ratio: 2.4 ratio (ref 0.0–5.0)
Cholesterol, Total: 217 mg/dL — ABNORMAL HIGH (ref 100–199)
HDL: 89 mg/dL (ref >39)
LDL Chol Calc (NIH): 121 mg/dL — ABNORMAL HIGH (ref 0–99)
Triglycerides: 41 mg/dL (ref 0–149)
VLDL Cholesterol Cal: 7 mg/dL (ref 5–40)

## 2024-10-18 LAB — TSH: TSH: 2.82 u[IU]/mL (ref 0.450–4.500)

## 2024-10-30 NOTE — Progress Notes (Signed)
 Patient called.  Patient aware.  Per Dr. Fleeta Finger His labs look good but his cholesterol is a bit elevated- cut out fatty foods and try to exerise. .   Letter sent.

## 2025-01-13 ENCOUNTER — Ambulatory Visit: Admitting: Internal Medicine
